# Patient Record
Sex: Male | Born: 1940
Health system: Southern US, Community
[De-identification: ages and names within clinical notes are randomized; demographics above are authoritative.]

## PROBLEM LIST (undated history)

## (undated) DIAGNOSIS — J449 Chronic obstructive pulmonary disease, unspecified: Secondary | ICD-10-CM

## (undated) DIAGNOSIS — I4510 Unspecified right bundle-branch block: Secondary | ICD-10-CM

## (undated) DIAGNOSIS — C679 Malignant neoplasm of bladder, unspecified: Secondary | ICD-10-CM

## (undated) DIAGNOSIS — K635 Polyp of colon: Secondary | ICD-10-CM

## (undated) DIAGNOSIS — I739 Peripheral vascular disease, unspecified: Secondary | ICD-10-CM

## (undated) DIAGNOSIS — E119 Type 2 diabetes mellitus without complications: Secondary | ICD-10-CM

## (undated) DIAGNOSIS — K5790 Diverticulosis of intestine, part unspecified, without perforation or abscess without bleeding: Secondary | ICD-10-CM

## (undated) DIAGNOSIS — I700 Atherosclerosis of aorta: Secondary | ICD-10-CM

## (undated) DIAGNOSIS — L989 Disorder of the skin and subcutaneous tissue, unspecified: Secondary | ICD-10-CM

## (undated) DIAGNOSIS — H9190 Unspecified hearing loss, unspecified ear: Secondary | ICD-10-CM

## (undated) DIAGNOSIS — IMO0002 Reserved for concepts with insufficient information to code with codable children: Secondary | ICD-10-CM

## (undated) DIAGNOSIS — L570 Actinic keratosis: Secondary | ICD-10-CM

## (undated) DIAGNOSIS — C439 Malignant melanoma of skin, unspecified: Secondary | ICD-10-CM

## (undated) DIAGNOSIS — I10 Essential (primary) hypertension: Secondary | ICD-10-CM

## (undated) DIAGNOSIS — C801 Malignant (primary) neoplasm, unspecified: Secondary | ICD-10-CM

## (undated) DIAGNOSIS — R0609 Other forms of dyspnea: Secondary | ICD-10-CM

## (undated) DIAGNOSIS — N400 Enlarged prostate without lower urinary tract symptoms: Secondary | ICD-10-CM

## (undated) DIAGNOSIS — R229 Localized swelling, mass and lump, unspecified: Secondary | ICD-10-CM

## (undated) DIAGNOSIS — E785 Hyperlipidemia, unspecified: Secondary | ICD-10-CM

## (undated) DIAGNOSIS — Z7901 Long term (current) use of anticoagulants: Secondary | ICD-10-CM

## (undated) HISTORY — PX: SHOULDER ARTHROSCOPY W/ ROTATOR CUFF REPAIR: SHX2400

## (undated) HISTORY — PX: HERNIA REPAIR: SHX51

## (undated) HISTORY — PX: CARDIAC CATHETERIZATION: SHX172

## (undated) HISTORY — DX: Malignant melanoma of skin, unspecified: C43.9

## (undated) HISTORY — DX: Actinic keratosis: L57.0

## (undated) HISTORY — PX: BLADDER SURGERY: SHX569

## (undated) HISTORY — PX: OTHER SURGICAL HISTORY: SHX169

---

## 1998-04-20 DIAGNOSIS — C439 Malignant melanoma of skin, unspecified: Secondary | ICD-10-CM

## 1998-04-20 HISTORY — DX: Malignant melanoma of skin, unspecified: C43.9

## 2005-08-14 ENCOUNTER — Ambulatory Visit: Payer: Self-pay | Admitting: Gastroenterology

## 2007-05-26 DIAGNOSIS — C679 Malignant neoplasm of bladder, unspecified: Secondary | ICD-10-CM

## 2007-05-26 HISTORY — DX: Malignant neoplasm of bladder, unspecified: C67.9

## 2007-06-14 ENCOUNTER — Other Ambulatory Visit: Payer: Self-pay

## 2007-06-14 ENCOUNTER — Ambulatory Visit: Payer: Self-pay | Admitting: Urology

## 2007-06-15 ENCOUNTER — Ambulatory Visit: Payer: Self-pay | Admitting: Cardiology

## 2007-06-20 ENCOUNTER — Ambulatory Visit: Payer: Self-pay | Admitting: Urology

## 2009-11-14 ENCOUNTER — Ambulatory Visit: Payer: Self-pay | Admitting: Gastroenterology

## 2009-11-18 LAB — PATHOLOGY REPORT

## 2012-02-01 ENCOUNTER — Ambulatory Visit: Payer: Self-pay | Admitting: Specialist

## 2012-02-23 ENCOUNTER — Ambulatory Visit: Payer: Self-pay | Admitting: Specialist

## 2012-02-23 LAB — POTASSIUM: Potassium: 3.9 mmol/L (ref 3.5–5.1)

## 2012-03-01 ENCOUNTER — Ambulatory Visit: Payer: Self-pay | Admitting: Specialist

## 2013-06-08 ENCOUNTER — Ambulatory Visit: Payer: Self-pay | Admitting: Cardiology

## 2014-08-07 NOTE — Op Note (Signed)
PATIENT NAME:  Jonathon Barrera, Jonathon Barrera MR#:  [id number] DATE OF BIRTH:  [birth year]  DATE OF PROCEDURE:  03/01/2012  PREOPERATIVE DIAGNOSES: 1. Large U-shape tear of the right rotator cuff with retraction.  2. Severe impingement syndrome right shoulder with anterior acromial hooking.  3. Advanced arthritis right AC joint.  4. Fraying of the biceps tendon.  5. Mild fraying of the labrum.   POSTOPERATIVE DIAGNOSES: 1. Large U-shape tear of the right rotator cuff with retraction.  2. Severe impingement syndrome right shoulder with anterior acromial hooking.  3. Advanced arthritis right AC joint.  4. Fraying of the biceps tendon.  5. Mild fraying of the labrum.   PROCEDURES:  1. Arthroscopic right rotator cuff repair with two ArthroCare 5.5-mm speed screws and free sutures.  2. Arthroscopic right distal clavicle excision.  3. Arthroscopic subacromial decompression.  4. Arthroscopic debridement of the biceps tendon and labrum.   SURGEON: Park Breed, M.D.   ANESTHESIA: General endotracheal plus interscalene block.   COMPLICATIONS: None.   DRAINS: None.   ESTIMATED BLOOD LOSS: Minimal.   REPLACED: None.   OPERATIVE FINDINGS: The patient had a large U-shape tear with retraction of the supraspinatus from the biceps tendon posteriorly into the infraspinatus. There was a large anterior hooked spur of the anterior acromion. The space beneath the [facility] joint was very tight. The labrum was frayed and the biceps tendon was frayed but intact. The glenohumeral surfaces were intact.   OPERATIVE PROCEDURE: The patient was brought to the operating room where he underwent satisfactory general endotracheal anesthesia after a right interscalene block had been instilled. He was turned to the left lateral decubitus position and padded appropriately on the beanbag. The right shoulder was prepped and draped in sterile fashion and placed in 10 pounds of traction. Arthroscopy was carried out from posterior portal  with accessory portals laterally and anteriorly. The above findings were encountered on examining the joint. Soft tissue debridement was carried out with the ArthroCare wand and motorized resector. You could see all the way down into the joint due to the retraction of the cuff. Debridement of the biceps and labrum was carried out from superiorly. The arthroscope was also introduced into the glenohumeral joint. There were no loose bodies. No other findings noted. The scope was reintroduced into the subacromial bursa and a large bur introduced. An anterior acromioplasty was carried out removing the large hook of anterior bone. Space was quite tight beneath the [facility] joint and a portion of the distal clavicle was removed in its entirety as well. The cuff tear was grasped. The  cuff was freed up on dorsal and volar surfaces.  It had fairly good mobility with a grasper. There was a U-shaped appearance to the apex. I was able to place two #2 Magnum wires side-to-side in  a convergent pattern to pull the cuff more laterally. Just doing this covered the articular surface completely.  I was then able to place two 5.5-mm speed screws into the lateral aspect of the tuberosity after the footprint had been debrided and gently burred to bleeding bone. The #2 Magnum wire suture was placed anteriorly and posteriorly in the cuff and two speed screws were introduced and the sutures were tied snugly, pulling the cuff all the way out onto the footprint, covering the joint completely.  There was a small rent anteriorly and the free ends of the sutures and the speed screw were passed through the cuff with a first pass device and these sutures were  tied on themselves to help flatten the cuff down to the footprint. Once this was completed, the joint was thoroughly irrigated free of debris. There was excellent lateralization of the cuff and good coverage. The instruments were removed and the stab wounds repaired with 3-0 nylon suture. 0.25%  Marcaine with epinephrine and morphine was placed in the joint and the bursa and a dry sterile dressing with TENS pads was applied. Traction was released and the arm was placed in a large sling. He was awakened and taken to recovery in good condition.  ____________________________ Park Breed, MD hem:bjt D:  03/01/2012 11:01:36 ET           T: 03/01/2012 11:21:55 ET          JOB#: [id number] Park Breed MD ELECTRONICALLY SIGNED 03/02/2012 13:23

## 2014-11-05 ENCOUNTER — Encounter: Payer: Self-pay | Admitting: Anesthesiology

## 2014-11-05 ENCOUNTER — Encounter
Admission: RE | Disposition: A | Discharge status: Home / Self Care | Payer: Self-pay | Source: Ambulatory Visit | Attending: Gastroenterology

## 2014-11-05 ENCOUNTER — Ambulatory Visit: Payer: PPO | Admitting: Anesthesiology

## 2014-11-05 ENCOUNTER — Ambulatory Visit
Admission: RE | Admit: 2014-11-05 | Discharge: 2014-11-05 | Disposition: A | Discharge status: Home / Self Care | Payer: PPO | Source: Ambulatory Visit | Attending: Gastroenterology | Admitting: Gastroenterology

## 2014-11-05 DIAGNOSIS — K5730 Diverticulosis of large intestine without perforation or abscess without bleeding: Secondary | ICD-10-CM | POA: Diagnosis not present

## 2014-11-05 DIAGNOSIS — Z8371 Family history of colonic polyps: Secondary | ICD-10-CM | POA: Diagnosis not present

## 2014-11-05 DIAGNOSIS — E119 Type 2 diabetes mellitus without complications: Secondary | ICD-10-CM | POA: Insufficient documentation

## 2014-11-05 DIAGNOSIS — F17210 Nicotine dependence, cigarettes, uncomplicated: Secondary | ICD-10-CM | POA: Diagnosis not present

## 2014-11-05 DIAGNOSIS — Z86010 Personal history of colonic polyps: Secondary | ICD-10-CM | POA: Diagnosis present

## 2014-11-05 DIAGNOSIS — J449 Chronic obstructive pulmonary disease, unspecified: Secondary | ICD-10-CM | POA: Insufficient documentation

## 2014-11-05 DIAGNOSIS — E785 Hyperlipidemia, unspecified: Secondary | ICD-10-CM | POA: Insufficient documentation

## 2014-11-05 DIAGNOSIS — I10 Essential (primary) hypertension: Secondary | ICD-10-CM | POA: Diagnosis not present

## 2014-11-05 DIAGNOSIS — Z800 Family history of malignant neoplasm of digestive organs: Secondary | ICD-10-CM | POA: Insufficient documentation

## 2014-11-05 DIAGNOSIS — N400 Enlarged prostate without lower urinary tract symptoms: Secondary | ICD-10-CM | POA: Diagnosis not present

## 2014-11-05 DIAGNOSIS — Z8551 Personal history of malignant neoplasm of bladder: Secondary | ICD-10-CM | POA: Insufficient documentation

## 2014-11-05 DIAGNOSIS — D127 Benign neoplasm of rectosigmoid junction: Secondary | ICD-10-CM | POA: Diagnosis not present

## 2014-11-05 HISTORY — DX: Benign prostatic hyperplasia without lower urinary tract symptoms: N40.0

## 2014-11-05 HISTORY — DX: Malignant (primary) neoplasm, unspecified: C80.1

## 2014-11-05 HISTORY — PX: COLONOSCOPY WITH PROPOFOL: SHX5780

## 2014-11-05 HISTORY — DX: Polyp of colon: K63.5

## 2014-11-05 HISTORY — DX: Malignant neoplasm of bladder, unspecified: C67.9

## 2014-11-05 HISTORY — DX: Type 2 diabetes mellitus without complications: E11.9

## 2014-11-05 HISTORY — DX: Essential (primary) hypertension: I10

## 2014-11-05 HISTORY — DX: Hyperlipidemia, unspecified: E78.5

## 2014-11-05 HISTORY — DX: Chronic obstructive pulmonary disease, unspecified: J44.9

## 2014-11-05 LAB — GLUCOSE, CAPILLARY: GLUCOSE-CAPILLARY: 121 mg/dL — AB (ref 65–99)

## 2014-11-05 SURGERY — COLONOSCOPY WITH PROPOFOL
Anesthesia: General

## 2014-11-05 MED ORDER — SODIUM CHLORIDE 0.9 % IV SOLN: INTRAVENOUS | Status: DC

## 2014-11-05 MED ORDER — PROPOFOL INFUSION 10 MG/ML OPTIME
INTRAVENOUS | Status: DC | PRN
  Administered 2014-11-05: 75 ug/kg/min via INTRAVENOUS

## 2014-11-05 MED ORDER — SODIUM CHLORIDE 0.9 % IV SOLN
INTRAVENOUS | Status: DC
  Administered 2014-11-05 (×2): via INTRAVENOUS

## 2014-11-05 MED ORDER — FENTANYL CITRATE (PF) 100 MCG/2ML IJ SOLN
INTRAMUSCULAR | Status: DC | PRN
  Administered 2014-11-05: 50 ug via INTRAVENOUS

## 2014-11-05 MED ORDER — EPHEDRINE SULFATE 50 MG/ML IJ SOLN
INTRAMUSCULAR | Status: DC | PRN
  Administered 2014-11-05: 10 mg via INTRAVENOUS
  Administered 2014-11-05: 5 mg via INTRAVENOUS

## 2014-11-05 MED ORDER — PHENYLEPHRINE HCL 10 MG/ML IJ SOLN
INTRAMUSCULAR | Status: DC | PRN
  Administered 2014-11-05: 100 ug via INTRAVENOUS

## 2014-11-05 MED ORDER — PROPOFOL 10 MG/ML IV BOLUS
INTRAVENOUS | Status: DC | PRN
  Administered 2014-11-05: 50 mg via INTRAVENOUS

## 2014-11-05 MED ORDER — GLYCOPYRROLATE 0.2 MG/ML IJ SOLN
INTRAMUSCULAR | Status: DC | PRN
  Administered 2014-11-05: 0.2 mg via INTRAVENOUS

## 2014-11-05 NOTE — Transfer of Care (Signed)
Immediate Anesthesia Transfer of Care Note  Patient: Jonathon Barrera  Procedure(s) Performed: Procedure(s): COLONOSCOPY WITH PROPOFOL (N/A)  Patient Location: PACU  Anesthesia Type:General  Level of Consciousness: awake  Airway & Oxygen Therapy: Patient Spontanous Breathing  Post-op Assessment: Report given to RN  Post vital signs: stable  Last Vitals:  Filed Vitals:   11/05/14 0936  BP: 83/59  Pulse: 76  Temp: 36.2 C  Resp: 18    Complications: No apparent anesthesia complications

## 2014-11-05 NOTE — Op Note (Signed)
[facility] Gastroenterology Patient Name: Jonathon Barrera Procedure Date: 11/05/2014 10:28 AM MRN: [id number] Account #: [id number] Date of Birth: 08-14-1940 Admit Type: Outpatient Age: 74 Room: [facility] ENDO ROOM 4 Gender: Male Note Status: Finalized Procedure:         Colonoscopy Indications:       Family history of colon cancer in a first-degree relative,                     Family history of colonic polyps in a first-degree                     relative, Personal history of colonic polyps Providers:         Lupita Dawn. Candace Cruise, MD Referring MD:      Leonie Douglas. Doy Hutching, MD (Referring MD) Medicines:         Monitored Anesthesia Care Complications:     No immediate complications. Procedure:         Pre-Anesthesia Assessment:                    - Prior to the procedure, a History and Physical was                     performed, and patient medications, allergies and                     sensitivities were reviewed. The patient's tolerance of                     previous anesthesia was reviewed.                    - The risks and benefits of the procedure and the sedation                     options and risks were discussed with the patient. All                     questions were answered and informed consent was obtained.                    - After reviewing the risks and benefits, the patient was                     deemed in satisfactory condition to undergo the procedure.                    After obtaining informed consent, the colonoscope was                     passed under direct vision. Throughout the procedure, the                     patient's blood pressure, pulse, and oxygen saturations                     were monitored continuously. The Olympus PCF-H180AL                     colonoscope ( S#: [id number] ) was introduced through the                     anus and advanced to the the cecum, identified by  appendiceal orifice and ileocecal valve. The  colonoscopy                     was performed without difficulty. The patient tolerated                     the procedure well. The quality of the bowel preparation                     was good. Findings:      A few small-mouthed diverticula were found in the sigmoid colon.      A diminutive polyp was found in the recto-sigmoid colon. The polyp was       sessile. The polyp was removed with a jumbo cold forceps. Resection and       retrieval were complete.      The exam was otherwise without abnormality.      A single medium-sized angiodysplastic lesion was found in the cecum. Impression:        - Diverticulosis in the sigmoid colon.                    - One diminutive polyp at the recto-sigmoid colon.                     Resected and retrieved.                    - The examination was otherwise normal.                    - A single colonic angiodysplastic lesion. Recommendation:    - Discharge patient to home.                    - Await pathology results.                    - Repeat colonoscopy in 5 years for surveillance.                    - The findings and recommendations were discussed with the                     patient. Procedure Code(s): --- Professional ---                    [id number], Colonoscopy, flexible; with biopsy, single or                     multiple Diagnosis Code(s): --- Professional ---                    D12.7, Benign neoplasm of rectosigmoid junction                    K55.20, Angiodysplasia of colon without hemorrhage                    Z80.0, Family history of malignant neoplasm of digestive                     organs                    Z83.71, Family history of colonic polyps                    Z86.010, Personal history of colonic polyps  K57.30, Diverticulosis of large intestine without                     perforation or abscess without bleeding CPT copyright 2014 American Medical Association. All rights reserved. The codes documented in  this report are preliminary and upon coder review may  be revised to meet current compliance requirements. Hulen Luster, MD 11/05/2014 10:58:09 AM This report has been signed electronically. Number of Addenda: 0 Note Initiated On: 11/05/2014 10:28 AM Scope Withdrawal Time: 0 hours 6 minutes 47 seconds  Total Procedure Duration: 0 hours 11 minutes 14 seconds       [facility]

## 2014-11-05 NOTE — Anesthesia Preprocedure Evaluation (Addendum)
Anesthesia Evaluation  Patient identified by MRN, date of birth, ID band Patient awake    Reviewed: Allergy & Precautions, H&P , NPO status , Patient's Chart, lab work & pertinent test results, reviewed documented beta blocker date and time   Airway Mallampati: III  TM Distance: >3 FB Neck ROM: limited    Dental  (+) Missing, Upper Dentures, Lower Dentures   Pulmonary COPDCurrent Smoker,  breath sounds clear to auscultation  Pulmonary exam normal       Cardiovascular Exercise Tolerance: Poor hypertension, Normal cardiovascular examRhythm:regular Rate:Normal     Neuro/Psych negative neurological ROS  negative psych ROS   GI/Hepatic negative GI ROS, Neg liver ROS,   Endo/Other  negative endocrine ROSdiabetes, Type 2  Renal/GU negative Renal ROS  negative genitourinary   Musculoskeletal   Abdominal   Peds  Hematology negative hematology ROS (+)   Anesthesia Other Findings Past Medical History:   COPD (chronic obstructive pulmonary disease)                 Hypertension                                                 Diabetes mellitus without complication                       Cancer                                                       Bladder carcinoma                                            Colon polyps                                                 Hyperlipemia                                                 BPH (benign prostatic hyperplasia)                           Reproductive/Obstetrics negative OB ROS                           Anesthesia Physical Anesthesia Plan  ASA: III  Anesthesia Plan: General   Post-op Pain Management:    Induction:   Airway Management Planned:   Additional Equipment:   Intra-op Plan:   Post-operative Plan:   Informed Consent: I have reviewed the patients History and Physical, chart, labs and discussed the procedure including the risks,  benefits and alternatives for the proposed anesthesia with the patient or authorized representative who has indicated his/her understanding and acceptance.   Dental Advisory Given  Plan Discussed  with: Anesthesiologist, CRNA and Surgeon  Anesthesia Plan Comments:        Anesthesia Quick Evaluation

## 2014-11-05 NOTE — Anesthesia Postprocedure Evaluation (Signed)
  Anesthesia Post-op Note  Patient: Jonathon Barrera  Procedure(s) Performed: Procedure(s): COLONOSCOPY WITH PROPOFOL (N/A)  Anesthesia type:General  Patient location: PACU  Post pain: Pain level controlled  Post assessment: Post-op Vital signs reviewed, Patient's Cardiovascular Status Stable, Respiratory Function Stable, Patent Airway and No signs of Nausea or vomiting  Post vital signs: Reviewed and stable  Last Vitals:  Filed Vitals:   11/05/14 1130  BP: 96/61  Pulse: 64  Temp:   Resp: 21    Level of consciousness: awake, alert  and patient cooperative  Complications: No apparent anesthesia complications

## 2014-11-05 NOTE — H&P (Signed)
    Primary Care Physician:  Idelle Crouch, MD Primary Gastroenterologist:  Dr. Candace Cruise  Pre-Procedure History & Physical: HPI:  Jonathon Barrera is a 74 y.o. male is here for an colonoscopy.   Past Medical History  Diagnosis Date  . Hypertension   . Diabetes mellitus without complication   . Cancer   . Bladder carcinoma   . Colon polyps   . Hyperlipemia   . BPH (benign prostatic hyperplasia)     Past Surgical History  Procedure Laterality Date  . Hernia repair    . Tur-bt      Prior to Admission medications   Medication Sig Start Date End Date Taking? Authorizing Provider  aspirin EC 81 MG tablet Take 81 mg by mouth daily.   Yes Historical Provider, MD  cyclobenzaprine (FLEXERIL) 5 MG tablet Take 5 mg by mouth 3 (three) times daily as needed for muscle spasms.   Yes Historical Provider, MD  losartan-hydrochlorothiazide (HYZAAR) 100-25 MG per tablet Take 1 tablet by mouth daily.   Yes Historical Provider, MD  metFORMIN (GLUCOPHAGE) 1000 MG tablet Take 1,000 mg by mouth 2 (two) times daily with a meal.   Yes Historical Provider, MD  Misc Natural Products (URINOZINC PLUS PO) Take by mouth.   Yes Historical Provider, MD  sitaGLIPtin (JANUVIA) 100 MG tablet Take 100 mg by mouth daily.   Yes Historical Provider, MD  tamsulosin (FLOMAX) 0.4 MG CAPS capsule Take 0.4 mg by mouth.   Yes Historical Provider, MD    Allergies as of 10/09/2014  . (Not on File)    History reviewed. No pertinent family history.  History   Social History  . Marital Status: Married    Spouse Name: N/A  . Number of Children: N/A  . Years of Education: N/A   Occupational History  . Not on file.   Social History Main Topics  . Smoking status: Current Every Day Smoker -- 0.50 packs/day    Types: Cigarettes  . Smokeless tobacco: Not on file  . Alcohol Use: 1.8 oz/week    3 Cans of beer per week  . Drug Use: No  . Sexual Activity: Not on file   Other Topics Concern  . Not on file   Social  History Narrative    Review of Systems: See HPI, otherwise negative ROS  Physical Exam: BP 83/59 mmHg  Pulse 76  Temp(Src) 97.1 F (36.2 C) (Tympanic)  Resp 18  Ht 5\' 9"  (1.753 m)  Wt 82.555 kg (182 lb)  BMI 26.86 kg/m2  SpO2 100% General:   Alert,  pleasant and cooperative in NAD Head:  Normocephalic and atraumatic. Neck:  Supple; no masses or thyromegaly. Lungs:  Clear throughout to auscultation.    Heart:  Regular rate and rhythm. Abdomen:  Soft, nontender and nondistended. Normal bowel sounds, without guarding, and without rebound.   Neurologic:  Alert and  oriented x4;  grossly normal neurologically.  Impression/Plan: Jonathon Barrera is here for an colonoscopy to be performed for personal hx of colon polyps  Risks, benefits, limitations, and alternatives regarding  Colonoscopy have been reviewed with the patient.  Questions have been answered.  All parties agreeable.   Chrystie Hagwood, Lupita Dawn, MD  11/05/2014, 10:05 AM

## 2014-11-06 LAB — SURGICAL PATHOLOGY

## 2014-11-12 ENCOUNTER — Encounter: Payer: Self-pay | Admitting: Gastroenterology

## 2015-04-24 DIAGNOSIS — H2511 Age-related nuclear cataract, right eye: Secondary | ICD-10-CM | POA: Diagnosis not present

## 2015-04-25 ENCOUNTER — Encounter: Payer: Self-pay | Admitting: *Deleted

## 2015-04-30 ENCOUNTER — Ambulatory Visit
Admission: RE | Admit: 2015-04-30 | Discharge: 2015-04-30 | Disposition: A | Discharge status: Home / Self Care | Payer: PPO | Source: Ambulatory Visit | Attending: Ophthalmology | Admitting: Ophthalmology

## 2015-04-30 ENCOUNTER — Ambulatory Visit: Payer: PPO | Admitting: Anesthesiology

## 2015-04-30 ENCOUNTER — Encounter
Admission: RE | Disposition: A | Discharge status: Home / Self Care | Payer: Self-pay | Source: Ambulatory Visit | Attending: Ophthalmology

## 2015-04-30 DIAGNOSIS — Z8551 Personal history of malignant neoplasm of bladder: Secondary | ICD-10-CM | POA: Diagnosis not present

## 2015-04-30 DIAGNOSIS — Z7984 Long term (current) use of oral hypoglycemic drugs: Secondary | ICD-10-CM | POA: Diagnosis not present

## 2015-04-30 DIAGNOSIS — E119 Type 2 diabetes mellitus without complications: Secondary | ICD-10-CM | POA: Diagnosis not present

## 2015-04-30 DIAGNOSIS — Z85820 Personal history of malignant melanoma of skin: Secondary | ICD-10-CM | POA: Insufficient documentation

## 2015-04-30 DIAGNOSIS — J449 Chronic obstructive pulmonary disease, unspecified: Secondary | ICD-10-CM | POA: Diagnosis not present

## 2015-04-30 DIAGNOSIS — H2511 Age-related nuclear cataract, right eye: Secondary | ICD-10-CM | POA: Diagnosis not present

## 2015-04-30 DIAGNOSIS — Z79899 Other long term (current) drug therapy: Secondary | ICD-10-CM | POA: Insufficient documentation

## 2015-04-30 DIAGNOSIS — H9190 Unspecified hearing loss, unspecified ear: Secondary | ICD-10-CM | POA: Insufficient documentation

## 2015-04-30 DIAGNOSIS — Z7982 Long term (current) use of aspirin: Secondary | ICD-10-CM | POA: Insufficient documentation

## 2015-04-30 DIAGNOSIS — I10 Essential (primary) hypertension: Secondary | ICD-10-CM | POA: Insufficient documentation

## 2015-04-30 DIAGNOSIS — F17200 Nicotine dependence, unspecified, uncomplicated: Secondary | ICD-10-CM | POA: Insufficient documentation

## 2015-04-30 DIAGNOSIS — Z9889 Other specified postprocedural states: Secondary | ICD-10-CM | POA: Insufficient documentation

## 2015-04-30 DIAGNOSIS — H269 Unspecified cataract: Secondary | ICD-10-CM | POA: Diagnosis not present

## 2015-04-30 HISTORY — DX: Unspecified hearing loss, unspecified ear: H91.90

## 2015-04-30 HISTORY — PX: CATARACT EXTRACTION W/PHACO: SHX586

## 2015-04-30 LAB — GLUCOSE, CAPILLARY: GLUCOSE-CAPILLARY: 164 mg/dL — AB (ref 65–99)

## 2015-04-30 SURGERY — PHACOEMULSIFICATION, CATARACT, WITH IOL INSERTION
Anesthesia: Monitor Anesthesia Care | Site: Eye | Laterality: Right | Wound class: Clean

## 2015-04-30 MED ORDER — NA CHONDROIT SULF-NA HYALURON 40-17 MG/ML IO SOLN
INTRAOCULAR | Status: DC | PRN
  Administered 2015-04-30: 1 mL via INTRAOCULAR

## 2015-04-30 MED ORDER — MOXIFLOXACIN HCL 0.5 % OP SOLN: 1.0000 [drp] | OPHTHALMIC | Status: DC | PRN

## 2015-04-30 MED ORDER — MOXIFLOXACIN HCL 0.5 % OP SOLN
OPHTHALMIC | Status: AC
  Filled 2015-04-30: qty 3

## 2015-04-30 MED ORDER — TETRACAINE HCL 0.5 % OP SOLN
OPHTHALMIC | Status: AC
  Administered 2015-04-30: 1 [drp] via OPHTHALMIC
  Filled 2015-04-30: qty 2

## 2015-04-30 MED ORDER — ARMC OPHTHALMIC DILATING GEL
1.0000 "application " | OPHTHALMIC | Status: AC | PRN
  Administered 2015-04-30 (×2): 1 via OPHTHALMIC

## 2015-04-30 MED ORDER — TETRACAINE HCL 0.5 % OP SOLN
1.0000 [drp] | OPHTHALMIC | Status: AC | PRN
  Administered 2015-04-30: 1 [drp] via OPHTHALMIC

## 2015-04-30 MED ORDER — MOXIFLOXACIN HCL 0.5 % OP SOLN
OPHTHALMIC | Status: DC | PRN
  Administered 2015-04-30: 1 [drp] via OPHTHALMIC

## 2015-04-30 MED ORDER — CARBACHOL 0.01 % IO SOLN
INTRAOCULAR | Status: DC | PRN
  Administered 2015-04-30: .5 mL via INTRAOCULAR

## 2015-04-30 MED ORDER — FENTANYL CITRATE (PF) 100 MCG/2ML IJ SOLN
INTRAMUSCULAR | Status: DC | PRN
  Administered 2015-04-30: 50 ug via INTRAVENOUS

## 2015-04-30 MED ORDER — POVIDONE-IODINE 5 % OP SOLN
OPHTHALMIC | Status: AC
  Administered 2015-04-30: 1 via OPHTHALMIC
  Filled 2015-04-30: qty 30

## 2015-04-30 MED ORDER — EPINEPHRINE HCL 1 MG/ML IJ SOLN
INTRAOCULAR | Status: DC | PRN
  Administered 2015-04-30: 1 mL via OPHTHALMIC

## 2015-04-30 MED ORDER — SODIUM CHLORIDE 0.9 % IV SOLN
INTRAVENOUS | Status: DC
  Administered 2015-04-30 (×2): via INTRAVENOUS

## 2015-04-30 MED ORDER — POVIDONE-IODINE 5 % OP SOLN
1.0000 "application " | OPHTHALMIC | Status: AC | PRN
  Administered 2015-04-30: 1 via OPHTHALMIC

## 2015-04-30 MED ORDER — CEFUROXIME OPHTHALMIC INJECTION 1 MG/0.1 ML
INJECTION | OPHTHALMIC | Status: AC
  Filled 2015-04-30: qty 0.1

## 2015-04-30 MED ORDER — EPINEPHRINE HCL 1 MG/ML IJ SOLN
INTRAMUSCULAR | Status: AC
  Filled 2015-04-30: qty 1

## 2015-04-30 MED ORDER — NA CHONDROIT SULF-NA HYALURON 40-17 MG/ML IO SOLN
INTRAOCULAR | Status: AC
  Filled 2015-04-30: qty 1

## 2015-04-30 MED ORDER — ARMC OPHTHALMIC DILATING GEL
OPHTHALMIC | Status: AC
  Administered 2015-04-30: 1 via OPHTHALMIC
  Filled 2015-04-30: qty 0.25

## 2015-04-30 MED ORDER — MIDAZOLAM HCL 2 MG/2ML IJ SOLN
INTRAMUSCULAR | Status: DC | PRN
  Administered 2015-04-30: 1 mg via INTRAVENOUS

## 2015-04-30 MED ORDER — CEFUROXIME OPHTHALMIC INJECTION 1 MG/0.1 ML
INJECTION | OPHTHALMIC | Status: DC | PRN
  Administered 2015-04-30: .1 mL via INTRACAMERAL

## 2015-04-30 SURGICAL SUPPLY — 22 items
CANNULA ANT/CHMB 27GA (MISCELLANEOUS) ×3 IMPLANT
CUP MEDICINE 2OZ PLAST GRAD ST (MISCELLANEOUS) ×3 IMPLANT
GLOVE BIO SURGEON STRL SZ8 (GLOVE) ×3 IMPLANT
GLOVE BIOGEL M 6.5 STRL (GLOVE) ×3 IMPLANT
GLOVE SURG LX 8.0 MICRO (GLOVE) ×2
GLOVE SURG LX STRL 8.0 MICRO (GLOVE) ×1 IMPLANT
GOWN STRL REUS W/ TWL LRG LVL3 (GOWN DISPOSABLE) ×2 IMPLANT
GOWN STRL REUS W/TWL LRG LVL3 (GOWN DISPOSABLE) ×4
LENS IOL TECNIS 22.5 (Intraocular Lens) ×3 IMPLANT
LENS IOL TECNIS MONO 1P 22.5 (Intraocular Lens) ×1 IMPLANT
PACK CATARACT (MISCELLANEOUS) ×3 IMPLANT
PACK CATARACT BRASINGTON LX (MISCELLANEOUS) ×3 IMPLANT
PACK EYE AFTER SURG (MISCELLANEOUS) ×3 IMPLANT
SOL BSS BAG (MISCELLANEOUS) ×3
SOL PREP PVP 2OZ (MISCELLANEOUS) ×3
SOLUTION BSS BAG (MISCELLANEOUS) ×1 IMPLANT
SOLUTION PREP PVP 2OZ (MISCELLANEOUS) ×1 IMPLANT
SYR 3ML LL SCALE MARK (SYRINGE) ×3 IMPLANT
SYR 5ML LL (SYRINGE) ×3 IMPLANT
SYR TB 1ML 27GX1/2 LL (SYRINGE) ×3 IMPLANT
WATER STERILE IRR 1000ML POUR (IV SOLUTION) ×3 IMPLANT
WIPE NON LINTING 3.25X3.25 (MISCELLANEOUS) ×3 IMPLANT

## 2015-04-30 NOTE — Transfer of Care (Signed)
Immediate Anesthesia Transfer of Care Note  Patient: Jonathon Barrera  Procedure(s) Performed: Procedure(s) with comments: CATARACT EXTRACTION PHACO AND INTRAOCULAR LENS PLACEMENT (IOC) (Right) - [place] 01:03 AP% 21.4 CDE  13.57 fluid pack lot # [id number]  Patient Location: PACU  Anesthesia Type:MAC  Level of Consciousness: awake, alert  and oriented  Airway & Oxygen Therapy: Patient Spontanous Breathing  Post-op Assessment: Report given to RN and Post -op Vital signs reviewed and stable  Post vital signs: Reviewed and stable  Last Vitals:  Filed Vitals:   04/30/15 0852  BP: 144/88  Pulse: 71  Temp: 36.8 C  Resp: 16    Complications: No apparent anesthesia complications

## 2015-04-30 NOTE — Op Note (Signed)
PREOPERATIVE DIAGNOSIS:  Nuclear sclerotic cataract of the right eye.   POSTOPERATIVE DIAGNOSIS: nuclear sclerotic cataract right eye   OPERATIVE PROCEDURE:  Procedure(s): CATARACT EXTRACTION PHACO AND INTRAOCULAR LENS PLACEMENT (IOC)   SURGEON:  Birder Robson, MD.   ANESTHESIA:  Anesthesiologist: Martha Clan, MD CRNA: Nelda Marseille, CRNA  1.      Managed anesthesia care. 2.      Topical tetracaine drops followed by 2% Xylocaine jelly applied in the preoperative holding area.   COMPLICATIONS:  None.   TECHNIQUE:   Stop and chop   DESCRIPTION OF PROCEDURE:  The patient was examined and consented in the preoperative holding area where the aforementioned topical anesthesia was applied to the right eye and then brought back to the Operating Room where the right eye was prepped and draped in the usual sterile ophthalmic fashion and a lid speculum was placed. A paracentesis was created with the side port blade and the anterior chamber was filled with viscoelastic. A near clear corneal incision was performed with the steel keratome. A continuous curvilinear capsulorrhexis was performed with a cystotome followed by the capsulorrhexis forceps. Hydrodissection and hydrodelineation were carried out with BSS on a blunt cannula. The lens was removed in a stop and chop  technique and the remaining cortical material was removed with the irrigation-aspiration handpiece. The capsular bag was inflated with viscoelastic and the Technis ZCB00  lens was placed in the capsular bag without complication. The remaining viscoelastic was removed from the eye with the irrigation-aspiration handpiece. The wounds were hydrated. The anterior chamber was flushed with Miostat and the eye was inflated to physiologic pressure. 0.1 mL of cefuroxime concentration 10 mg/mL was placed in the anterior chamber. The wounds were found to be water tight. The eye was dressed with Vigamox. The patient was given protective glasses to wear  throughout the day and a shield with which to sleep tonight. The patient was also given drops with which to begin a drop regimen today and will follow-up with me in one day.  Implant Name Type Inv. Item Serial No. Manufacturer Lot No. LRB No. Used  LENS IMPL INTRAOC ZCB00 22.5 - [id number] Intraocular Lens LENS IMPL INTRAOC ZCB00 22.5 [id number]   Right 1   Procedure(s) with comments: CATARACT EXTRACTION PHACO AND INTRAOCULAR LENS PLACEMENT (IOC) (Right) - [place] 01:03 AP% 21.4 CDE  13.57 fluid pack lot # [id number]  Electronically signed: [place] 04/30/2015 10:44 AM

## 2015-04-30 NOTE — Anesthesia Postprocedure Evaluation (Signed)
Anesthesia Post Note  Patient: Jonathon Barrera  Procedure(s) Performed: Procedure(s) (LRB): CATARACT EXTRACTION PHACO AND INTRAOCULAR LENS PLACEMENT (IOC) (Right)  Patient location during evaluation: PACU Anesthesia Type: General Level of consciousness: awake and alert Pain management: pain level controlled Vital Signs Assessment: post-procedure vital signs reviewed and stable Respiratory status: spontaneous breathing, nonlabored ventilation, respiratory function stable and patient connected to nasal cannula oxygen Cardiovascular status: blood pressure returned to baseline and stable Postop Assessment: no signs of nausea or vomiting Anesthetic complications: no    Last Vitals:  Filed Vitals:   04/30/15 1041 04/30/15 1050  BP: 138/86 154/79  Pulse: 67 68  Temp: 36.5 C   Resp: 16 16    Last Pain: There were no vitals filed for this visit.               Martha Clan

## 2015-04-30 NOTE — H&P (Signed)
All labs reviewed. Abnormal studies sent to patients PCP when indicated.  Previous H&P reviewed, patient examined, there are NO CHANGES.  Jonathon Barrera LOUIS1/10/201710:17 AM

## 2015-04-30 NOTE — Anesthesia Procedure Notes (Signed)
Procedure Name: MAC Date/Time: 04/30/2015 10:34 AM Performed by: Nelda Marseille Pre-anesthesia Checklist: Patient identified, Emergency Drugs available, Suction available, Patient being monitored and Timeout performed

## 2015-04-30 NOTE — Anesthesia Preprocedure Evaluation (Addendum)
Anesthesia Evaluation  Patient identified by MRN, date of birth, ID band Patient awake    Reviewed: Allergy & Precautions, NPO status , Patient's Chart, lab work & pertinent test results  History of Anesthesia Complications Negative for: history of anesthetic complications  Airway Mallampati: II  TM Distance: <3 FB Neck ROM: full    Dental  (+) Upper Dentures, Lower Dentures   Pulmonary neg pulmonary ROS, Current Smoker,           Cardiovascular hypertension, Pt. on medications  + Friction Rub    Neuro/Psych negative neurological ROS     GI/Hepatic negative GI ROS, Neg liver ROS,   Endo/Other  diabetes, Type 2, Oral Hypoglycemic Agents  Renal/GU negative Renal ROS     Musculoskeletal   Abdominal   Peds  Hematology negative hematology ROS (+)   Anesthesia Other Findings   Reproductive/Obstetrics                            Anesthesia Physical Anesthesia Plan  ASA: III  Anesthesia Plan: MAC   Post-op Pain Management:    Induction:   Airway Management Planned:   Additional Equipment:   Intra-op Plan:   Post-operative Plan:   Informed Consent: I have reviewed the patients History and Physical, chart, labs and discussed the procedure including the risks, benefits and alternatives for the proposed anesthesia with the patient or authorized representative who has indicated his/her understanding and acceptance.     Plan Discussed with:   Anesthesia Plan Comments:         Anesthesia Quick Evaluation

## 2015-04-30 NOTE — Discharge Instructions (Signed)
AMBULATORY SURGERY  DISCHARGE INSTRUCTIONS   1) The drugs that you were given will stay in your system until tomorrow so for the next 24 hours you should not:  A) Drive an automobile B) Make any legal decisions C) Drink any alcoholic beverage   2) You may resume regular meals tomorrow.  Today it is better to start with liquids and gradually work up to solid foods.  You may eat anything you prefer, but it is better to start with liquids, then soup and crackers, and gradually work up to solid foods.   3) Please notify your doctor immediately if you have any unusual bleeding, trouble breathing, redness and pain at the surgery site, drainage, fever, or pain not relieved by medication.    4) Additional Instructions:   Eye Surgery Discharge Instructions  Expect mild scratchy sensation or mild soreness. DO NOT RUB YOUR EYE!  The day of surgery:  Minimal physical activity, but bed rest is not required  No reading, computer work, or close hand work  No bending, lifting, or straining.  May watch TV  For 24 hours:  No driving, legal decisions, or alcoholic beverages  Safety precautions  Eat anything you prefer: It is better to start with liquids, then soup then solid foods.  _____ Eye patch should be worn until postoperative exam tomorrow.  ____ Solar shield eyeglasses should be worn for comfort in the sunlight/patch while sleeping  Resume all regular medications including aspirin or Coumadin if these were discontinued prior to surgery. You may shower, bathe, shave, or wash your hair. Tylenol may be taken for mild discomfort.  Call your doctor if you experience significant pain, nausea, or vomiting, fever > 101 or other signs of infection. [phone] or [phone] Specific instructions:  Follow-up Information    Follow up with Tim Lair, MD.   Specialty:  Ophthalmology   Why:  January 11 at 10:00   Contact information:   [place] [address] [phone]          Please contact your physician with any problems or Same Day Surgery at [phone], Monday through Friday 6 am to 4 pm, or [place] at [facility] number at [phone].

## 2015-05-15 DIAGNOSIS — H2512 Age-related nuclear cataract, left eye: Secondary | ICD-10-CM | POA: Diagnosis not present

## 2015-05-16 ENCOUNTER — Encounter: Payer: Self-pay | Admitting: *Deleted

## 2015-05-21 ENCOUNTER — Encounter
Admission: RE | Disposition: A | Discharge status: Home / Self Care | Payer: Self-pay | Source: Ambulatory Visit | Attending: Ophthalmology

## 2015-05-21 ENCOUNTER — Ambulatory Visit: Payer: PPO | Admitting: Anesthesiology

## 2015-05-21 ENCOUNTER — Ambulatory Visit
Admission: RE | Admit: 2015-05-21 | Discharge: 2015-05-21 | Disposition: A | Discharge status: Home / Self Care | Payer: PPO | Source: Ambulatory Visit | Attending: Ophthalmology | Admitting: Ophthalmology

## 2015-05-21 DIAGNOSIS — H269 Unspecified cataract: Secondary | ICD-10-CM | POA: Diagnosis not present

## 2015-05-21 DIAGNOSIS — J449 Chronic obstructive pulmonary disease, unspecified: Secondary | ICD-10-CM | POA: Diagnosis not present

## 2015-05-21 DIAGNOSIS — Z9889 Other specified postprocedural states: Secondary | ICD-10-CM | POA: Diagnosis not present

## 2015-05-21 DIAGNOSIS — H2512 Age-related nuclear cataract, left eye: Secondary | ICD-10-CM | POA: Diagnosis not present

## 2015-05-21 DIAGNOSIS — Z7982 Long term (current) use of aspirin: Secondary | ICD-10-CM | POA: Diagnosis not present

## 2015-05-21 DIAGNOSIS — E119 Type 2 diabetes mellitus without complications: Secondary | ICD-10-CM | POA: Diagnosis not present

## 2015-05-21 DIAGNOSIS — Z8551 Personal history of malignant neoplasm of bladder: Secondary | ICD-10-CM | POA: Diagnosis not present

## 2015-05-21 DIAGNOSIS — F17200 Nicotine dependence, unspecified, uncomplicated: Secondary | ICD-10-CM | POA: Insufficient documentation

## 2015-05-21 DIAGNOSIS — I10 Essential (primary) hypertension: Secondary | ICD-10-CM | POA: Insufficient documentation

## 2015-05-21 DIAGNOSIS — Z79899 Other long term (current) drug therapy: Secondary | ICD-10-CM | POA: Diagnosis not present

## 2015-05-21 DIAGNOSIS — Z8546 Personal history of malignant neoplasm of prostate: Secondary | ICD-10-CM | POA: Insufficient documentation

## 2015-05-21 DIAGNOSIS — E785 Hyperlipidemia, unspecified: Secondary | ICD-10-CM | POA: Diagnosis not present

## 2015-05-21 DIAGNOSIS — Z85820 Personal history of malignant melanoma of skin: Secondary | ICD-10-CM | POA: Insufficient documentation

## 2015-05-21 DIAGNOSIS — H9190 Unspecified hearing loss, unspecified ear: Secondary | ICD-10-CM | POA: Insufficient documentation

## 2015-05-21 DIAGNOSIS — Z9849 Cataract extraction status, unspecified eye: Secondary | ICD-10-CM | POA: Diagnosis not present

## 2015-05-21 HISTORY — PX: CATARACT EXTRACTION W/PHACO: SHX586

## 2015-05-21 LAB — GLUCOSE, CAPILLARY: Glucose-Capillary: 122 mg/dL — ABNORMAL HIGH (ref 65–99)

## 2015-05-21 SURGERY — PHACOEMULSIFICATION, CATARACT, WITH IOL INSERTION
Anesthesia: Monitor Anesthesia Care | Site: Eye | Laterality: Left | Wound class: Clean

## 2015-05-21 MED ORDER — MOXIFLOXACIN HCL 0.5 % OP SOLN
OPHTHALMIC | Status: AC
  Filled 2015-05-21: qty 3

## 2015-05-21 MED ORDER — EPINEPHRINE HCL 1 MG/ML IJ SOLN
INTRAOCULAR | Status: DC | PRN
  Administered 2015-05-21: 1 mL via OPHTHALMIC

## 2015-05-21 MED ORDER — ALFENTANIL 500 MCG/ML IJ INJ
INJECTION | INTRAMUSCULAR | Status: DC | PRN
  Administered 2015-05-21: 500 ug via INTRAVENOUS

## 2015-05-21 MED ORDER — ARMC OPHTHALMIC DILATING GEL
1.0000 "application " | OPHTHALMIC | Status: AC | PRN
  Administered 2015-05-21 (×2): 1 via OPHTHALMIC

## 2015-05-21 MED ORDER — CEFUROXIME OPHTHALMIC INJECTION 1 MG/0.1 ML
INJECTION | OPHTHALMIC | Status: AC
  Filled 2015-05-21: qty 0.1

## 2015-05-21 MED ORDER — TETRACAINE HCL 0.5 % OP SOLN
1.0000 [drp] | OPHTHALMIC | Status: AC | PRN
  Administered 2015-05-21: 1 [drp] via OPHTHALMIC

## 2015-05-21 MED ORDER — POVIDONE-IODINE 5 % OP SOLN
1.0000 "application " | OPHTHALMIC | Status: AC | PRN
  Administered 2015-05-21: 1 via OPHTHALMIC

## 2015-05-21 MED ORDER — ARMC OPHTHALMIC DILATING GEL
OPHTHALMIC | Status: AC
  Administered 2015-05-21: 1 via OPHTHALMIC
  Filled 2015-05-21: qty 0.25

## 2015-05-21 MED ORDER — MOXIFLOXACIN HCL 0.5 % OP SOLN
OPHTHALMIC | Status: DC | PRN
  Administered 2015-05-21: 1 [drp] via OPHTHALMIC

## 2015-05-21 MED ORDER — POVIDONE-IODINE 5 % OP SOLN
OPHTHALMIC | Status: AC
  Administered 2015-05-21: 1 via OPHTHALMIC
  Filled 2015-05-21: qty 30

## 2015-05-21 MED ORDER — EPINEPHRINE HCL 1 MG/ML IJ SOLN
INTRAMUSCULAR | Status: AC
  Filled 2015-05-21: qty 1

## 2015-05-21 MED ORDER — MIDAZOLAM HCL 2 MG/2ML IJ SOLN
INTRAMUSCULAR | Status: DC | PRN
  Administered 2015-05-21: 1 mg via INTRAVENOUS

## 2015-05-21 MED ORDER — CEFUROXIME OPHTHALMIC INJECTION 1 MG/0.1 ML
INJECTION | OPHTHALMIC | Status: DC | PRN
  Administered 2015-05-21: .1 mL via INTRACAMERAL

## 2015-05-21 MED ORDER — MOXIFLOXACIN HCL 0.5 % OP SOLN
1.0000 [drp] | OPHTHALMIC | Status: DC | PRN
Start: 2015-05-21 — End: 2015-05-21

## 2015-05-21 MED ORDER — TETRACAINE HCL 0.5 % OP SOLN
OPHTHALMIC | Status: AC
  Administered 2015-05-21: 1 [drp] via OPHTHALMIC
  Filled 2015-05-21: qty 2

## 2015-05-21 MED ORDER — CARBACHOL 0.01 % IO SOLN
INTRAOCULAR | Status: DC | PRN
  Administered 2015-05-21: .5 mL via INTRAOCULAR

## 2015-05-21 MED ORDER — NA CHONDROIT SULF-NA HYALURON 40-17 MG/ML IO SOLN
INTRAOCULAR | Status: DC | PRN
  Administered 2015-05-21: 1 mL via INTRAOCULAR

## 2015-05-21 MED ORDER — SODIUM CHLORIDE 0.9 % IV SOLN
INTRAVENOUS | Status: DC
  Administered 2015-05-21: 11:00:00 via INTRAVENOUS

## 2015-05-21 SURGICAL SUPPLY — 22 items
CANNULA ANT/CHMB 27GA (MISCELLANEOUS) ×3 IMPLANT
CUP MEDICINE 2OZ PLAST GRAD ST (MISCELLANEOUS) ×3 IMPLANT
GLOVE BIO SURGEON STRL SZ8 (GLOVE) ×3 IMPLANT
GLOVE BIOGEL M 6.5 STRL (GLOVE) ×3 IMPLANT
GLOVE SURG LX 8.0 MICRO (GLOVE) ×2
GLOVE SURG LX STRL 8.0 MICRO (GLOVE) ×1 IMPLANT
GOWN STRL REUS W/ TWL LRG LVL3 (GOWN DISPOSABLE) ×2 IMPLANT
GOWN STRL REUS W/TWL LRG LVL3 (GOWN DISPOSABLE) ×4
LENS IOL TECNIS 23.0 (Intraocular Lens) ×3 IMPLANT
LENS IOL TECNIS MONO 1P 23.0 (Intraocular Lens) ×1 IMPLANT
PACK CATARACT (MISCELLANEOUS) ×3 IMPLANT
PACK CATARACT BRASINGTON LX (MISCELLANEOUS) ×3 IMPLANT
PACK EYE AFTER SURG (MISCELLANEOUS) ×3 IMPLANT
SOL BSS BAG (MISCELLANEOUS) ×3
SOL PREP PVP 2OZ (MISCELLANEOUS) ×3
SOLUTION BSS BAG (MISCELLANEOUS) ×1 IMPLANT
SOLUTION PREP PVP 2OZ (MISCELLANEOUS) ×1 IMPLANT
SYR 3ML LL SCALE MARK (SYRINGE) ×3 IMPLANT
SYR 5ML LL (SYRINGE) ×3 IMPLANT
SYR TB 1ML 27GX1/2 LL (SYRINGE) ×3 IMPLANT
WATER STERILE IRR 1000ML POUR (IV SOLUTION) ×3 IMPLANT
WIPE NON LINTING 3.25X3.25 (MISCELLANEOUS) ×3 IMPLANT

## 2015-05-21 NOTE — Op Note (Signed)
PREOPERATIVE DIAGNOSIS:  Nuclear sclerotic cataract of the left eye.   POSTOPERATIVE DIAGNOSIS:  nuclear sclerotic cataract left eye   OPERATIVE PROCEDURE:  Procedure(s): CATARACT EXTRACTION PHACO AND INTRAOCULAR LENS PLACEMENT (IOC)   SURGEON:  Birder Robson, MD.   ANESTHESIA:   Anesthesiologist: Iver Nestle, MD; Alvin Critchley, MD CRNA: Jonna Clark, CRNA  1.      Managed anesthesia care. 2.      Topical tetracaine drops followed by 2% Xylocaine jelly applied in the preoperative holding area.   COMPLICATIONS:  None.   TECHNIQUE:   Stop and chop   DESCRIPTION OF PROCEDURE:  The patient was examined and consented in the preoperative holding area where the aforementioned topical anesthesia was applied to the left eye and then brought back to the Operating Room where the left eye was prepped and draped in the usual sterile ophthalmic fashion and a lid speculum was placed. A paracentesis was created with the side port blade and the anterior chamber was filled with viscoelastic. A near clear corneal incision was performed with the steel keratome. A continuous curvilinear capsulorrhexis was performed with a cystotome followed by the capsulorrhexis forceps. Hydrodissection and hydrodelineation were carried out with BSS on a blunt cannula. The lens was removed in a stop and chop  technique and the remaining cortical material was removed with the irrigation-aspiration handpiece. The capsular bag was inflated with viscoelastic and the Technis ZCB00 lens was placed in the capsular bag without complication. The remaining viscoelastic was removed from the eye with the irrigation-aspiration handpiece. The wounds were hydrated. The anterior chamber was flushed with Miostat and the eye was inflated to physiologic pressure. 0.1 mL of cefuroxime concentration 10 mg/mL was placed in the anterior chamber. The wounds were found to be water tight. The eye was dressed with Vigamox. The patient was  given protective glasses to wear throughout the day and a shield with which to sleep tonight. The patient was also given drops with which to begin a drop regimen today and will follow-up with me in one day.  Implant Name Type Inv. Item Serial No. Manufacturer Lot No. LRB No. Used  LENS IMPL INTRAOC ZCB00 23.0 - [id number] Intraocular Lens LENS IMPL INTRAOC ZCB00 23.0 [id number]   Left 1   Procedure(s) with comments: CATARACT EXTRACTION PHACO AND INTRAOCULAR LENS PLACEMENT (IOC) (Left) - [place] 00:52 AP% 18.9 CDE 9.95 fluid pack lot [id number]  Electronically signed: Welcome 05/21/2015 12:17 PM

## 2015-05-21 NOTE — H&P (Signed)
All labs reviewed. Abnormal studies sent to patients PCP when indicated.  Previous H&P reviewed, patient examined, there are NO CHANGES.  Jonathon Barrera LOUIS1/31/201711:47 AM

## 2015-05-21 NOTE — Discharge Instructions (Signed)
Eye Surgery Discharge Instructions  Expect mild scratchy sensation or mild soreness. DO NOT RUB YOUR EYE!  The day of surgery:  Minimal physical activity, but bed rest is not required  No reading, computer work, or close hand work  No bending, lifting, or straining.  May watch TV  For 24 hours:  No driving, legal decisions, or alcoholic beverages  Safety precautions  Eat anything you prefer: It is better to start with liquids, then soup then solid foods.  _____ Eye patch should be worn until postoperative exam tomorrow.  ____ Solar shield eyeglasses should be worn for comfort in the sunlight/patch while sleeping  Resume all regular medications including aspirin or Coumadin if these were discontinued prior to surgery. You may shower, bathe, shave, or wash your hair. Tylenol may be taken for mild discomfort.  Call your doctor if you experience significant pain, nausea, or vomiting, fever > 101 or other signs of infection. [phone] or [phone] Specific instructions:  Follow-up Information    Follow up with Tim Lair, MD On 05/22/2015.   Specialty:  Ophthalmology   Why:  For wound re-check at 9:35 am   Contact information:   [place]  [id number] [phone]     AMBULATORY SURGERY  DISCHARGE INSTRUCTIONS   1) The drugs that you were given will stay in your system until tomorrow so for the next 24 hours you should not:  A) Drive an automobile B) Make any legal decisions C) Drink any alcoholic beverage   2) You may resume regular meals tomorrow.  Today it is better to start with liquids and gradually work up to solid foods.  You may eat anything you prefer, but it is better to start with liquids, then soup and crackers, and gradually work up to solid foods.   3) Please notify your doctor immediately if you have any unusual bleeding, trouble breathing, redness and pain at the surgery site, drainage, fever, or pain not relieved  by medication.    Additional Instructions:   Please contact your physician with any problems or Same Day Surgery at [phone], Monday through Friday 6 am to 4 pm, or [place] at [facility] number at [phone].

## 2015-05-21 NOTE — Transfer of Care (Signed)
Immediate Anesthesia Transfer of Care Note  Patient: Jonathon Barrera  Procedure(s) Performed: Procedure(s) with comments: CATARACT EXTRACTION PHACO AND INTRAOCULAR LENS PLACEMENT (IOC) (Left) - [place] 00:52 AP% 18.9 CDE 9.95 fluid pack lot [id number]  Patient Location: PACU and Short Stay  Anesthesia Type:MAC  Level of Consciousness: awake, alert  and oriented  Airway & Oxygen Therapy: Patient Spontanous Breathing  Post-op Assessment: Report given to RN and Post -op Vital signs reviewed and stable  Post vital signs: Reviewed and stable  Last Vitals:  Filed Vitals:   05/21/15 1035 05/21/15 1220  BP: 135/81 134/94  Pulse: 64 54  Temp: 36.6 C 36.5 C  Resp: 16 16    Complications: No apparent anesthesia complications

## 2015-05-21 NOTE — Anesthesia Postprocedure Evaluation (Signed)
Anesthesia Post Note  Patient: Jonathon Barrera  Procedure(s) Performed: Procedure(s) (LRB): CATARACT EXTRACTION PHACO AND INTRAOCULAR LENS PLACEMENT (IOC) (Left)  Patient location during evaluation: Short Stay Anesthesia Type: MAC Level of consciousness: awake and alert and oriented Pain management: pain level controlled Vital Signs Assessment: post-procedure vital signs reviewed and stable Respiratory status: spontaneous breathing Cardiovascular status: stable Anesthetic complications: no    Last Vitals:  Filed Vitals:   05/21/15 1035 05/21/15 1220  BP: 135/81 134/94  Pulse: 64 54  Temp: 36.6 C 36.5 C  Resp: 16 16    Last Pain:  Filed Vitals:   05/21/15 1220  PainSc: 0-No pain                 Lanora Manis

## 2015-05-21 NOTE — Anesthesia Preprocedure Evaluation (Signed)
Anesthesia Evaluation  Patient identified by MRN, date of birth, ID band Patient awake    Reviewed: Allergy & Precautions, NPO status , Patient's Chart, lab work & pertinent test results  Airway Mallampati: III   Neck ROM: Limited  Mouth opening: Limited Mouth Opening  Dental  (+) Lower Dentures, Upper Dentures   Pulmonary COPD,  COPD inhaler, Current Smoker,    Pulmonary exam normal        Cardiovascular hypertension, Pt. on medications Normal cardiovascular exam     Neuro/Psych negative neurological ROS  negative psych ROS   GI/Hepatic negative GI ROS, Neg liver ROS,   Endo/Other  diabetes, Well Controlled, Type 2  Renal/GU negative Renal ROS Bladder dysfunction  Bladder CA Hx    Musculoskeletal negative musculoskeletal ROS (+)   Abdominal Normal abdominal exam  (+)   Peds negative pediatric ROS (+)  Hematology negative hematology ROS (+)   Anesthesia Other Findings   Reproductive/Obstetrics                             Anesthesia Physical Anesthesia Plan  ASA: III  Anesthesia Plan: MAC   Post-op Pain Management:    Induction: Intravenous  Airway Management Planned: Nasal Cannula  Additional Equipment:   Intra-op Plan:   Post-operative Plan:   Informed Consent: I have reviewed the patients History and Physical, chart, labs and discussed the procedure including the risks, benefits and alternatives for the proposed anesthesia with the patient or authorized representative who has indicated his/her understanding and acceptance.   Dental advisory given  Plan Discussed with: CRNA and Surgeon  Anesthesia Plan Comments:         Anesthesia Quick Evaluation

## 2015-06-19 DIAGNOSIS — Z961 Presence of intraocular lens: Secondary | ICD-10-CM | POA: Diagnosis not present

## 2015-06-20 DIAGNOSIS — Z79899 Other long term (current) drug therapy: Secondary | ICD-10-CM | POA: Diagnosis not present

## 2015-06-20 DIAGNOSIS — E7800 Pure hypercholesterolemia, unspecified: Secondary | ICD-10-CM | POA: Diagnosis not present

## 2015-06-20 DIAGNOSIS — Z125 Encounter for screening for malignant neoplasm of prostate: Secondary | ICD-10-CM | POA: Diagnosis not present

## 2015-06-20 DIAGNOSIS — I10 Essential (primary) hypertension: Secondary | ICD-10-CM | POA: Diagnosis not present

## 2015-06-20 DIAGNOSIS — E119 Type 2 diabetes mellitus without complications: Secondary | ICD-10-CM | POA: Diagnosis not present

## 2015-06-20 DIAGNOSIS — Z0000 Encounter for general adult medical examination without abnormal findings: Secondary | ICD-10-CM | POA: Diagnosis not present

## 2015-06-20 DIAGNOSIS — J449 Chronic obstructive pulmonary disease, unspecified: Secondary | ICD-10-CM | POA: Diagnosis not present

## 2015-06-20 DIAGNOSIS — J431 Panlobular emphysema: Secondary | ICD-10-CM | POA: Diagnosis not present

## 2015-12-16 DIAGNOSIS — H43813 Vitreous degeneration, bilateral: Secondary | ICD-10-CM | POA: Diagnosis not present

## 2015-12-26 DIAGNOSIS — E7800 Pure hypercholesterolemia, unspecified: Secondary | ICD-10-CM | POA: Diagnosis not present

## 2015-12-26 DIAGNOSIS — E119 Type 2 diabetes mellitus without complications: Secondary | ICD-10-CM | POA: Diagnosis not present

## 2015-12-26 DIAGNOSIS — Z79899 Other long term (current) drug therapy: Secondary | ICD-10-CM | POA: Diagnosis not present

## 2015-12-26 DIAGNOSIS — I10 Essential (primary) hypertension: Secondary | ICD-10-CM | POA: Diagnosis not present

## 2016-01-20 DIAGNOSIS — Z23 Encounter for immunization: Secondary | ICD-10-CM | POA: Diagnosis not present

## 2016-06-25 DIAGNOSIS — Z79899 Other long term (current) drug therapy: Secondary | ICD-10-CM | POA: Diagnosis not present

## 2016-06-25 DIAGNOSIS — J449 Chronic obstructive pulmonary disease, unspecified: Secondary | ICD-10-CM | POA: Diagnosis not present

## 2016-06-25 DIAGNOSIS — I10 Essential (primary) hypertension: Secondary | ICD-10-CM | POA: Diagnosis not present

## 2016-06-25 DIAGNOSIS — E7800 Pure hypercholesterolemia, unspecified: Secondary | ICD-10-CM | POA: Diagnosis not present

## 2016-06-25 DIAGNOSIS — Z125 Encounter for screening for malignant neoplasm of prostate: Secondary | ICD-10-CM | POA: Diagnosis not present

## 2016-06-25 DIAGNOSIS — Z0000 Encounter for general adult medical examination without abnormal findings: Secondary | ICD-10-CM | POA: Diagnosis not present

## 2016-06-25 DIAGNOSIS — E119 Type 2 diabetes mellitus without complications: Secondary | ICD-10-CM | POA: Diagnosis not present

## 2016-06-25 DIAGNOSIS — J431 Panlobular emphysema: Secondary | ICD-10-CM | POA: Diagnosis not present

## 2016-09-16 DIAGNOSIS — L812 Freckles: Secondary | ICD-10-CM | POA: Diagnosis not present

## 2016-09-16 DIAGNOSIS — D485 Neoplasm of uncertain behavior of skin: Secondary | ICD-10-CM | POA: Diagnosis not present

## 2016-09-16 DIAGNOSIS — L821 Other seborrheic keratosis: Secondary | ICD-10-CM | POA: Diagnosis not present

## 2016-09-16 DIAGNOSIS — L570 Actinic keratosis: Secondary | ICD-10-CM | POA: Diagnosis not present

## 2016-09-16 DIAGNOSIS — D1800 Hemangioma unspecified site: Secondary | ICD-10-CM | POA: Diagnosis not present

## 2016-09-16 DIAGNOSIS — D235 Other benign neoplasm of skin of trunk: Secondary | ICD-10-CM | POA: Diagnosis not present

## 2016-09-16 DIAGNOSIS — Z1283 Encounter for screening for malignant neoplasm of skin: Secondary | ICD-10-CM | POA: Diagnosis not present

## 2016-09-16 DIAGNOSIS — L578 Other skin changes due to chronic exposure to nonionizing radiation: Secondary | ICD-10-CM | POA: Diagnosis not present

## 2016-09-16 DIAGNOSIS — D229 Melanocytic nevi, unspecified: Secondary | ICD-10-CM | POA: Diagnosis not present

## 2016-09-16 DIAGNOSIS — I788 Other diseases of capillaries: Secondary | ICD-10-CM | POA: Diagnosis not present

## 2016-09-25 DIAGNOSIS — E119 Type 2 diabetes mellitus without complications: Secondary | ICD-10-CM | POA: Diagnosis not present

## 2016-11-16 DIAGNOSIS — L570 Actinic keratosis: Secondary | ICD-10-CM | POA: Diagnosis not present

## 2016-11-16 DIAGNOSIS — L821 Other seborrheic keratosis: Secondary | ICD-10-CM | POA: Diagnosis not present

## 2016-11-16 DIAGNOSIS — L820 Inflamed seborrheic keratosis: Secondary | ICD-10-CM | POA: Diagnosis not present

## 2016-11-16 DIAGNOSIS — Z85820 Personal history of malignant melanoma of skin: Secondary | ICD-10-CM | POA: Diagnosis not present

## 2016-11-16 DIAGNOSIS — L812 Freckles: Secondary | ICD-10-CM | POA: Diagnosis not present

## 2016-11-16 DIAGNOSIS — L578 Other skin changes due to chronic exposure to nonionizing radiation: Secondary | ICD-10-CM | POA: Diagnosis not present

## 2016-12-28 DIAGNOSIS — I10 Essential (primary) hypertension: Secondary | ICD-10-CM | POA: Diagnosis not present

## 2016-12-28 DIAGNOSIS — E7800 Pure hypercholesterolemia, unspecified: Secondary | ICD-10-CM | POA: Diagnosis not present

## 2016-12-28 DIAGNOSIS — J431 Panlobular emphysema: Secondary | ICD-10-CM | POA: Diagnosis not present

## 2016-12-28 DIAGNOSIS — Z79899 Other long term (current) drug therapy: Secondary | ICD-10-CM | POA: Diagnosis not present

## 2016-12-28 DIAGNOSIS — E119 Type 2 diabetes mellitus without complications: Secondary | ICD-10-CM | POA: Diagnosis not present

## 2017-02-01 DIAGNOSIS — L720 Epidermal cyst: Secondary | ICD-10-CM | POA: Diagnosis not present

## 2017-02-01 DIAGNOSIS — L812 Freckles: Secondary | ICD-10-CM | POA: Diagnosis not present

## 2017-02-01 DIAGNOSIS — L578 Other skin changes due to chronic exposure to nonionizing radiation: Secondary | ICD-10-CM | POA: Diagnosis not present

## 2017-02-01 DIAGNOSIS — C4492 Squamous cell carcinoma of skin, unspecified: Secondary | ICD-10-CM

## 2017-02-01 DIAGNOSIS — L821 Other seborrheic keratosis: Secondary | ICD-10-CM | POA: Diagnosis not present

## 2017-02-01 DIAGNOSIS — D485 Neoplasm of uncertain behavior of skin: Secondary | ICD-10-CM | POA: Diagnosis not present

## 2017-02-01 DIAGNOSIS — C44629 Squamous cell carcinoma of skin of left upper limb, including shoulder: Secondary | ICD-10-CM | POA: Diagnosis not present

## 2017-02-01 HISTORY — DX: Squamous cell carcinoma of skin, unspecified: C44.92

## 2017-02-08 DIAGNOSIS — Z23 Encounter for immunization: Secondary | ICD-10-CM | POA: Diagnosis not present

## 2017-03-03 DIAGNOSIS — D485 Neoplasm of uncertain behavior of skin: Secondary | ICD-10-CM | POA: Diagnosis not present

## 2017-03-03 DIAGNOSIS — L820 Inflamed seborrheic keratosis: Secondary | ICD-10-CM | POA: Diagnosis not present

## 2017-03-03 DIAGNOSIS — L570 Actinic keratosis: Secondary | ICD-10-CM | POA: Diagnosis not present

## 2017-03-03 DIAGNOSIS — Z85828 Personal history of other malignant neoplasm of skin: Secondary | ICD-10-CM | POA: Diagnosis not present

## 2017-03-03 DIAGNOSIS — L821 Other seborrheic keratosis: Secondary | ICD-10-CM | POA: Diagnosis not present

## 2017-03-03 DIAGNOSIS — L578 Other skin changes due to chronic exposure to nonionizing radiation: Secondary | ICD-10-CM | POA: Diagnosis not present

## 2017-04-16 DIAGNOSIS — H3554 Dystrophies primarily involving the retinal pigment epithelium: Secondary | ICD-10-CM | POA: Diagnosis not present

## 2017-07-01 DIAGNOSIS — Z79899 Other long term (current) drug therapy: Secondary | ICD-10-CM | POA: Diagnosis not present

## 2017-07-01 DIAGNOSIS — E7800 Pure hypercholesterolemia, unspecified: Secondary | ICD-10-CM | POA: Diagnosis not present

## 2017-07-01 DIAGNOSIS — I10 Essential (primary) hypertension: Secondary | ICD-10-CM | POA: Diagnosis not present

## 2017-07-01 DIAGNOSIS — J431 Panlobular emphysema: Secondary | ICD-10-CM | POA: Diagnosis not present

## 2017-07-01 DIAGNOSIS — Z0000 Encounter for general adult medical examination without abnormal findings: Secondary | ICD-10-CM | POA: Diagnosis not present

## 2017-07-01 DIAGNOSIS — J449 Chronic obstructive pulmonary disease, unspecified: Secondary | ICD-10-CM | POA: Diagnosis not present

## 2017-07-01 DIAGNOSIS — Z125 Encounter for screening for malignant neoplasm of prostate: Secondary | ICD-10-CM | POA: Diagnosis not present

## 2017-07-01 DIAGNOSIS — E119 Type 2 diabetes mellitus without complications: Secondary | ICD-10-CM | POA: Diagnosis not present

## 2017-10-29 DIAGNOSIS — T1512XA Foreign body in conjunctival sac, left eye, initial encounter: Secondary | ICD-10-CM | POA: Diagnosis not present

## 2017-11-15 DIAGNOSIS — Z85828 Personal history of other malignant neoplasm of skin: Secondary | ICD-10-CM | POA: Diagnosis not present

## 2017-11-15 DIAGNOSIS — Z85820 Personal history of malignant melanoma of skin: Secondary | ICD-10-CM | POA: Diagnosis not present

## 2017-11-15 DIAGNOSIS — L821 Other seborrheic keratosis: Secondary | ICD-10-CM | POA: Diagnosis not present

## 2017-11-15 DIAGNOSIS — L820 Inflamed seborrheic keratosis: Secondary | ICD-10-CM | POA: Diagnosis not present

## 2017-11-15 DIAGNOSIS — L578 Other skin changes due to chronic exposure to nonionizing radiation: Secondary | ICD-10-CM | POA: Diagnosis not present

## 2017-11-15 DIAGNOSIS — D225 Melanocytic nevi of trunk: Secondary | ICD-10-CM | POA: Diagnosis not present

## 2017-11-15 DIAGNOSIS — Z1283 Encounter for screening for malignant neoplasm of skin: Secondary | ICD-10-CM | POA: Diagnosis not present

## 2017-11-15 DIAGNOSIS — L570 Actinic keratosis: Secondary | ICD-10-CM | POA: Diagnosis not present

## 2018-01-04 DIAGNOSIS — I10 Essential (primary) hypertension: Secondary | ICD-10-CM | POA: Diagnosis not present

## 2018-01-04 DIAGNOSIS — E119 Type 2 diabetes mellitus without complications: Secondary | ICD-10-CM | POA: Diagnosis not present

## 2018-01-04 DIAGNOSIS — E7800 Pure hypercholesterolemia, unspecified: Secondary | ICD-10-CM | POA: Diagnosis not present

## 2018-01-04 DIAGNOSIS — Z79899 Other long term (current) drug therapy: Secondary | ICD-10-CM | POA: Diagnosis not present

## 2018-01-04 DIAGNOSIS — J431 Panlobular emphysema: Secondary | ICD-10-CM | POA: Diagnosis not present

## 2018-01-27 DIAGNOSIS — Z23 Encounter for immunization: Secondary | ICD-10-CM | POA: Diagnosis not present

## 2018-02-17 ENCOUNTER — Ambulatory Visit: Payer: Self-pay | Admitting: General Surgery

## 2018-02-17 DIAGNOSIS — L720 Epidermal cyst: Secondary | ICD-10-CM | POA: Diagnosis not present

## 2018-02-17 DIAGNOSIS — R220 Localized swelling, mass and lump, head: Secondary | ICD-10-CM | POA: Diagnosis not present

## 2018-02-17 NOTE — H&P (View-Only) (Signed)
PATIENT PROFILE: Jonathon Barrera is a 77 y.o. male who presents to the Clinic for consultation at the request of Dr. Doy Hutching for evaluation of epidermal inclusion cyst.  PCP:  Idelle Crouch, MD  HISTORY OF PRESENT ILLNESS: Mr. Biebel reports having a cyst on his back scalp and face since many years ago. In the last few weeks he has been feeling that both cyst has been growing in size. Refers that in these last few weeks it start to cause pain and discomfort. Pain does not radiates. Pain is aggravated with pressure. No alleviator factor. Patient denies previous infection or secretions.    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 Followed by Dr. Yves Dill      BPH (benign prostatic hyperplasia) Unknown   Hx of colonic polyps Unknown   Overview    Histological report colonoscopy 07/20/05 adenomatous      Hyperlipidemia Unknown   Type 2 diabetes mellitus (CMS-HCC) Unknown      GENERAL REVIEW OF SYSTEMS:   General ROS: negative for - chills, fatigue, fever, weight gain or weight loss Allergy and Immunology ROS: negative for - hives  Hematological and Lymphatic ROS: negative for - bleeding problems or bruising, negative for palpable nodes Endocrine ROS: negative for - heat or cold intolerance, hair changes Respiratory ROS: negative for - cough, shortness of breath or wheezing Cardiovascular ROS: no chest pain or palpitations GI ROS: negative for nausea, vomiting, abdominal pain, diarrhea, constipation Musculoskeletal ROS: negative for - joint swelling or muscle pain Neurological ROS: negative for - confusion, syncope Dermatological ROS: negative for pruritus and rash Psychiatric: negative for anxiety, depression,  difficulty sleeping and memory loss  MEDICATIONS: CurrentMedications        Current Outpatient Medications  Medication Sig Dispense Refill  . aspirin 81 MG EC tablet Take 81 mg by mouth once daily.    Marland Kitchen glimepiride (AMARYL) 2 MG tablet TAKE 1 TABLET (2 MG TOTAL) BY MOUTH DAILY WITH BREAKFAST. 90 tablet 3  . losartan-hydrochlorothiazide (HYZAAR) 100-25 mg tablet Take 1 tablet by mouth once daily 90 tablet 3  . metFORMIN (GLUCOPHAGE) 1000 MG tablet Take 1 tablet (1,000 mg total) by mouth 2 (two) times daily with meals 180 tablet 1  . MULTIVIT-MIN/HRB CB121 (URINOZINC PROSTATE FORMULA ORAL) Take by mouth once daily.      . tamsulosin (FLOMAX) 0.4 mg capsule Take 0.4 mg by mouth as needed. Take 30 minutes after same meal each day.     No current facility-administered medications for this visit.       ALLERGIES: Patient has no known allergies.  PAST MEDICAL HISTORY:     Past Medical History:  Diagnosis Date  . Bladder carcinoma (CMS-HCC)    papillary-transitional cell 05/26/2007 Yves Dill). Followed by Dr. Yves Dill  . BPH (benign prostatic hyperplasia)   . COPD (chronic obstructive pulmonary disease) (CMS-HCC)   . Diverticulosis 11/05/2014, 11/14/2009  . Hx of colonic polyps    Histological report colonoscopy 07/20/05 adenomatous  . Hyperlipidemia   . Hyperplastic colon polyp 11/05/2014, 01/29/1999  . Hypertension   . Tobacco abuse   . Tubular adenoma of colon, unspecified 08/14/2005  . Type 2 diabetes mellitus (CMS-HCC)     PAST  SURGICAL HISTORY:      Past Surgical History:  Procedure Laterality Date  . COLONOSCOPY  11/14/2009   PHx polyps/FHx CC-Mother/FHx CP-Brother  . COLONOSCOPY  11/05/2014   Hyperplastic colon polyp/PHx polyps/FHx CC-Mother/FHx CP-Brother/Repeat 11yr/PYO  . Right inguinal hernia repair    . TUR-BT on 06/20/2007 (Yves Dill.       FAMILY HISTORY: Family History  Problem Relation Age of Onset  . Diabetes type II Mother   .  Heart disease Mother   . Cancer Mother   . Colon cancer Mother   . Diabetes type II Father   . Colon polyps Brother      SOCIAL HISTORY: Social History          Socioeconomic History  . Marital status: Married    Spouse name: Not on file  . Number of children: Not on file  . Years of education: Not on file  . Highest education level: Not on file  Occupational History  . Not on file  Social Needs  . Financial resource strain: Not on file  . Food insecurity:    Worry: Not on file    Inability: Not on file  . Transportation needs:    Medical: Not on file    Non-medical: Not on file  Tobacco Use  . Smoking status: Current Every Day Smoker  . Smokeless tobacco: Never Used  . Tobacco comment: 1/2 ppd  Substance and Sexual Activity  . Alcohol use: No  . Drug use: Not on file  . Sexual activity: Not on file  Other Topics Concern  . Not on file  Social History Narrative  . Not on file      PHYSICAL EXAM:    Vitals:   02/17/18 0804  BP: (!) 144/93  Pulse: 73  Temp: 36.3 C (97.4 F)   Body mass index is 28.77 kg/m. Weight: 87.1 kg (192 lb 0.3 oz)   GENERAL: Alert, active, oriented x3  HEENT: Pupils equal reactive to light. Extraocular movements are intact. Sclera clear. Palpebral conjunctiva normal red color.Pharynx clear. On the posterior scalp there is a 5 cm round mass, mildly mobile, mild tender to palpation, no cellulitis, no drainage. On the anterior face there is a 1.5 cm round mass mobile, soft, mild tender. No secretions. No cellulitis.   NECK: Supple with no palpable mass and no adenopathy.  LUNGS: Sound clear with no rales rhonchi or wheezes.  HEART: Regular rhythm S1 and S2 without murmur.  ABDOMEN: Soft and depressible, nontender with no palpable mass, no hepatomegaly.   EXTREMITIES: Well-developed well-nourished symmetrical with no dependent edema.  NEUROLOGICAL: Awake alert oriented, facial expression symmetrical,  moving all extremities.  REVIEW OF DATA: I have reviewed the following data today:      Office Visit on 01/04/2018  Component Date Value  . Glucose 01/04/2018 144*  . Sodium 01/04/2018 141   . Potassium 01/04/2018 3.9   . Chloride 01/04/2018 103   . Carbon Dioxide (CO2) 01/04/2018 33.3*  . Urea Nitrogen (BUN) 01/04/2018 22   . Creatinine 01/04/2018 0.9   . Glomerular Filtration Ra* 01/04/2018 82   . Calcium 01/04/2018 9.5   . AST  01/04/2018 19   . ALT  01/04/2018 26   . Alk Phos (alkaline Phosp* 01/04/2018 62   . Albumin 01/04/2018 4.4   . Bilirubin, Total 01/04/2018 0.7   . Protein, Total 01/04/2018 7.2   . A/G Ratio 01/04/2018 1.6   . WBC (White Blood Cell Co* 01/04/2018 6.3   .  RBC (Red Blood Cell Coun* 01/04/2018 4.75   . Hemoglobin 01/04/2018 14.9   . Hematocrit 01/04/2018 44.3   . MCV (Mean Corpuscular Vo* 01/04/2018 93.3   . MCH (Mean Corpuscular He* 01/04/2018 31.4*  . MCHC (Mean Corpuscular H* 01/04/2018 33.6   . Platelet Count 01/04/2018 159   . RDW-CV (Red Cell Distrib* 01/04/2018 13.6   . MPV (Mean Platelet Volum* 01/04/2018 11.6   . Neutrophils 01/04/2018 3.51   . Lymphocytes 01/04/2018 1.94   . Monocytes 01/04/2018 0.52   . Eosinophils 01/04/2018 0.21   . Basophils 01/04/2018 0.09   . Neutrophil % 01/04/2018 55.9   . Lymphocyte % 01/04/2018 30.9   . Monocyte % 01/04/2018 8.3   . Eosinophil % 01/04/2018 3.3   . Basophil% 01/04/2018 1.4   . Immature Granulocyte % 01/04/2018 0.2   . Immature Granulocyte Cou* 01/04/2018 0.01   . Hemoglobin A1C 01/04/2018 7.2*  . Average Blood Glucose (C* 01/04/2018 160      ASSESSMENT: Mr. Kneeland is a 77 y.o. male presenting for consultation for cyst of the posterior scalp and face. The patient has a mass on the posterior scalp and face that is causing some pain and discomfort on pressure and has been growing in the last few weeks. Patient oriented about the diagnosis of epidermal cysts, not a malignant lesions but if it  is causing symptoms, excision can be considered. Patient oriented about the procedure, benefits and risk. Due to location and size on the scalp and face, I recommend to do the procedure in the OR for better control of bleeding and pain and comfort for patient.    PLAN: 1. Excision of scalp cyst and face (11426, 11442) 2. CBC, CMP done 01/04/18 3. Stop Aspirin 5 days before surgery 4. Contact us if has any question or concern.   Patient and his wife verbalized understanding, all questions were answered, and were agreeable with the plan outlined above.   Herbert Pun, MD  Electronically signed by Herbert Pun, MD

## 2018-02-17 NOTE — H&P (Signed)
PATIENT PROFILE: Jonathon Barrera is a 77 y.o. male who presents to the Clinic for consultation at the request of Dr. Doy Barrera for evaluation of epidermal inclusion cyst.  PCP:  Jonathon Crouch, MD  HISTORY OF PRESENT ILLNESS: [name] reports having a cyst on his back scalp and face since many years ago. In the last few weeks he has been feeling that both cyst has been growing in size. Refers that in these last few weeks it start to cause pain and discomfort. Pain does not radiates. Pain is aggravated with pressure. No alleviator factor. Patient denies previous infection or secretions.    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 Followed by Dr. Yves Barrera      BPH (benign prostatic hyperplasia) Unknown   Hx of colonic polyps Unknown   Overview    Histological report colonoscopy 07/20/05 adenomatous      Hyperlipidemia Unknown   Type 2 diabetes mellitus (CMS-HCC) Unknown      GENERAL REVIEW OF SYSTEMS:   General ROS: negative for - chills, fatigue, fever, weight gain or weight loss Allergy and Immunology ROS: negative for - hives  Hematological and Lymphatic ROS: negative for - bleeding problems or bruising, negative for palpable nodes Endocrine ROS: negative for - heat or cold intolerance, hair changes Respiratory ROS: negative for - cough, shortness of breath or wheezing Cardiovascular ROS: no chest pain or palpitations GI ROS: negative for nausea, vomiting, abdominal pain, diarrhea, constipation Musculoskeletal ROS: negative for - joint swelling or muscle pain Neurological ROS: negative for - confusion, syncope Dermatological ROS: negative for pruritus and rash Psychiatric: negative for anxiety, depression,  difficulty sleeping and memory loss  MEDICATIONS: CurrentMedications        Current Outpatient Medications  Medication Sig Dispense Refill  . aspirin 81 MG EC tablet Take 81 mg by mouth once daily.    Marland Kitchen glimepiride (AMARYL) 2 MG tablet TAKE 1 TABLET (2 MG TOTAL) BY MOUTH DAILY WITH BREAKFAST. 90 tablet 3  . losartan-hydrochlorothiazide (HYZAAR) 100-25 mg tablet Take 1 tablet by mouth once daily 90 tablet 3  . metFORMIN (GLUCOPHAGE) 1000 MG tablet Take 1 tablet (1,000 mg total) by mouth 2 (two) times daily with meals 180 tablet 1  . MULTIVIT-MIN/HRB CB121 (URINOZINC PROSTATE FORMULA ORAL) Take by mouth once daily.      . tamsulosin (FLOMAX) 0.4 mg capsule Take 0.4 mg by mouth as needed. Take 30 minutes after same meal each day.     No current facility-administered medications for this visit.       ALLERGIES: Patient has no known allergies.  PAST MEDICAL HISTORY:     Past Medical History:  Diagnosis Date  . Bladder carcinoma (CMS-HCC)    papillary-transitional cell 05/26/2007 Jonathon Barrera). Followed by Dr. Yves Barrera  . BPH (benign prostatic hyperplasia)   . COPD (chronic obstructive pulmonary disease) (CMS-HCC)   . Diverticulosis 11/05/2014, 11/14/2009  . Hx of colonic polyps    Histological report colonoscopy 07/20/05 adenomatous  . Hyperlipidemia   . Hyperplastic colon polyp 11/05/2014, 01/29/1999  . Hypertension   . Tobacco abuse   . Tubular adenoma of colon, unspecified 08/14/2005  . Type 2 diabetes mellitus (CMS-HCC)     PAST  SURGICAL HISTORY:      Past Surgical History:  Procedure Laterality Date  . COLONOSCOPY  11/14/2009   PHx polyps/FHx CC-Mother/FHx CP-Brother  . COLONOSCOPY  11/05/2014   Hyperplastic colon polyp/PHx polyps/FHx CC-Mother/FHx CP-Brother/Repeat 50yr/PYO  . Right inguinal hernia repair    . TUR-BT on 06/20/2007 (Jonathon Barrera.       FAMILY HISTORY: Family History  Problem Relation Age of Onset  . Diabetes type II Mother   .  Heart disease Mother   . Cancer Mother   . Colon cancer Mother   . Diabetes type II Father   . Colon polyps Brother      SOCIAL HISTORY: Social History          Socioeconomic History  . Marital status: Married    Spouse name: Not on file  . Number of children: Not on file  . Years of education: Not on file  . Highest education level: Not on file  Occupational History  . Not on file  Social Needs  . Financial resource strain: Not on file  . Food insecurity:    Worry: Not on file    Inability: Not on file  . Transportation needs:    Medical: Not on file    Non-medical: Not on file  Tobacco Use  . Smoking status: Current Every Day Smoker  . Smokeless tobacco: Never Used  . Tobacco comment: 1/2 ppd  Substance and Sexual Activity  . Alcohol use: No  . Drug use: Not on file  . Sexual activity: Not on file  Other Topics Concern  . Not on file  Social History Narrative  . Not on file      PHYSICAL EXAM:    Vitals:   02/17/18 0804  BP: (!) 144/93  Pulse: 73  Temp: 36.3 C (97.4 F)   Body mass index is 28.77 kg/m. Weight: 87.1 kg (192 lb 0.3 oz)   GENERAL: Alert, active, oriented x3  HEENT: Pupils equal reactive to light. Extraocular movements are intact. Sclera clear. Palpebral conjunctiva normal red color.Pharynx clear. On the posterior scalp there is a 5 cm round mass, mildly mobile, mild tender to palpation, no cellulitis, no drainage. On the anterior face there is a 1.5 cm round mass mobile, soft, mild tender. No secretions. No cellulitis.   NECK: Supple with no palpable mass and no adenopathy.  LUNGS: Sound clear with no rales rhonchi or wheezes.  HEART: Regular rhythm S1 and S2 without murmur.  ABDOMEN: Soft and depressible, nontender with no palpable mass, no hepatomegaly.   EXTREMITIES: Well-developed well-nourished symmetrical with no dependent edema.  NEUROLOGICAL: Awake alert oriented, facial expression symmetrical,  moving all extremities.  REVIEW OF DATA: I have reviewed the following data today:      Office Visit on 01/04/2018  Component Date Value  . Glucose 01/04/2018 144*  . Sodium 01/04/2018 141   . Potassium 01/04/2018 3.9   . Chloride 01/04/2018 103   . Carbon Dioxide (CO2) 01/04/2018 33.3*  . Urea Nitrogen (BUN) 01/04/2018 22   . Creatinine 01/04/2018 0.9   . Glomerular Filtration Ra* 01/04/2018 82   . Calcium 01/04/2018 9.5   . AST  01/04/2018 19   . ALT  01/04/2018 26   . Alk Phos (alkaline Phosp* 01/04/2018 62   . Albumin 01/04/2018 4.4   . Bilirubin, Total 01/04/2018 0.7   . Protein, Total 01/04/2018 7.2   . A/G Ratio 01/04/2018 1.6   . WBC (White Blood Cell Co* 01/04/2018 6.3   .  RBC (Red Blood Cell Coun* 01/04/2018 4.75   . Hemoglobin 01/04/2018 14.9   . Hematocrit 01/04/2018 44.3   . MCV (Mean Corpuscular Vo* 01/04/2018 93.3   . MCH (Mean Corpuscular He* 01/04/2018 31.4*  . MCHC (Mean Corpuscular H* 01/04/2018 33.6   . Platelet Count 01/04/2018 159   . RDW-CV (Red Cell Distrib* 01/04/2018 13.6   . MPV (Mean Platelet Volum* 01/04/2018 11.6   . Neutrophils 01/04/2018 3.51   . Lymphocytes 01/04/2018 1.94   . Monocytes 01/04/2018 0.52   . Eosinophils 01/04/2018 0.21   . Basophils 01/04/2018 0.09   . Neutrophil % 01/04/2018 55.9   . Lymphocyte % 01/04/2018 30.9   . Monocyte % 01/04/2018 8.3   . Eosinophil % 01/04/2018 3.3   . Basophil% 01/04/2018 1.4   . Immature Granulocyte % 01/04/2018 0.2   . Immature Granulocyte Cou* 01/04/2018 0.01   . Hemoglobin A1C 01/04/2018 7.2*  . Average Blood Glucose (C* 01/04/2018 160      ASSESSMENT: Mr. Kneeland is a 77 y.o. male presenting for consultation for cyst of the posterior scalp and face. The patient has a mass on the posterior scalp and face that is causing some pain and discomfort on pressure and has been growing in the last few weeks. Patient oriented about the diagnosis of epidermal cysts, not a malignant lesions but if it  is causing symptoms, excision can be considered. Patient oriented about the procedure, benefits and risk. Due to location and size on the scalp and face, I recommend to do the procedure in the OR for better control of bleeding and pain and comfort for patient.    PLAN: 1. Excision of scalp cyst and face (11426, 11442) 2. CBC, CMP done 01/04/18 3. Stop Aspirin 5 days before surgery 4. Contact us if has any question or concern.   Patient and his wife verbalized understanding, all questions were answered, and were agreeable with the plan outlined above.   Herbert Pun, MD  Electronically signed by Herbert Pun, MD

## 2018-02-21 ENCOUNTER — Encounter
Admission: RE | Admit: 2018-02-21 | Discharge: 2018-02-21 | Disposition: A | Discharge status: Home / Self Care | Payer: PPO | Source: Ambulatory Visit | Attending: General Surgery | Admitting: General Surgery

## 2018-02-21 ENCOUNTER — Other Ambulatory Visit: Payer: Self-pay

## 2018-02-21 DIAGNOSIS — N400 Enlarged prostate without lower urinary tract symptoms: Secondary | ICD-10-CM | POA: Diagnosis not present

## 2018-02-21 DIAGNOSIS — Z8551 Personal history of malignant neoplasm of bladder: Secondary | ICD-10-CM | POA: Diagnosis not present

## 2018-02-21 DIAGNOSIS — F17200 Nicotine dependence, unspecified, uncomplicated: Secondary | ICD-10-CM | POA: Diagnosis not present

## 2018-02-21 DIAGNOSIS — J449 Chronic obstructive pulmonary disease, unspecified: Secondary | ICD-10-CM | POA: Diagnosis not present

## 2018-02-21 DIAGNOSIS — Z79899 Other long term (current) drug therapy: Secondary | ICD-10-CM | POA: Diagnosis not present

## 2018-02-21 DIAGNOSIS — Z7984 Long term (current) use of oral hypoglycemic drugs: Secondary | ICD-10-CM | POA: Diagnosis not present

## 2018-02-21 DIAGNOSIS — L720 Epidermal cyst: Secondary | ICD-10-CM | POA: Diagnosis not present

## 2018-02-21 DIAGNOSIS — Z833 Family history of diabetes mellitus: Secondary | ICD-10-CM | POA: Diagnosis not present

## 2018-02-21 DIAGNOSIS — Z7982 Long term (current) use of aspirin: Secondary | ICD-10-CM | POA: Diagnosis not present

## 2018-02-21 DIAGNOSIS — Z01818 Encounter for other preprocedural examination: Secondary | ICD-10-CM | POA: Insufficient documentation

## 2018-02-21 DIAGNOSIS — I4510 Unspecified right bundle-branch block: Secondary | ICD-10-CM | POA: Insufficient documentation

## 2018-02-21 DIAGNOSIS — Z8249 Family history of ischemic heart disease and other diseases of the circulatory system: Secondary | ICD-10-CM | POA: Diagnosis not present

## 2018-02-21 DIAGNOSIS — Z8371 Family history of colonic polyps: Secondary | ICD-10-CM | POA: Diagnosis not present

## 2018-02-21 DIAGNOSIS — R220 Localized swelling, mass and lump, head: Secondary | ICD-10-CM | POA: Diagnosis present

## 2018-02-21 DIAGNOSIS — E119 Type 2 diabetes mellitus without complications: Secondary | ICD-10-CM | POA: Diagnosis not present

## 2018-02-21 DIAGNOSIS — Z86010 Personal history of colonic polyps: Secondary | ICD-10-CM | POA: Diagnosis not present

## 2018-02-21 DIAGNOSIS — I10 Essential (primary) hypertension: Secondary | ICD-10-CM | POA: Diagnosis not present

## 2018-02-21 DIAGNOSIS — Z800 Family history of malignant neoplasm of digestive organs: Secondary | ICD-10-CM | POA: Diagnosis not present

## 2018-02-21 DIAGNOSIS — Z809 Family history of malignant neoplasm, unspecified: Secondary | ICD-10-CM | POA: Diagnosis not present

## 2018-02-21 DIAGNOSIS — E785 Hyperlipidemia, unspecified: Secondary | ICD-10-CM | POA: Diagnosis not present

## 2018-02-21 HISTORY — DX: Reserved for concepts with insufficient information to code with codable children: IMO0002

## 2018-02-21 HISTORY — DX: Localized swelling, mass and lump, unspecified: R22.9

## 2018-02-21 HISTORY — DX: Disorder of the skin and subcutaneous tissue, unspecified: L98.9

## 2018-02-21 NOTE — Patient Instructions (Addendum)
Your procedure is scheduled on: 02/23/18 Wed Report to Same Day Surgery 2nd floor medical mall [facility] Entrance-take elevator on left to 2nd floor.  Check in with surgery information desk.) To find out your arrival time please call [phone] between 1PM - 3PM on 02/22/18 Tues  Remember: Instructions that are not followed completely may result in serious medical risk, up to and including death, or upon the discretion of your surgeon and anesthesiologist your surgery may need to be rescheduled.    _x___ 1. Do not eat food after midnight the night before your procedure. You may drink clear liquids up to 2 hours before you are scheduled to arrive at the hospital for your procedure.  Do not drink clear liquids within 2 hours of your scheduled arrival to the hospital.  Clear liquids include  --Water or Apple juice without pulp  --Clear carbohydrate beverage such as ClearFast or Gatorade  --Black Coffee or Clear Tea (No milk, no creamers, do not add anything to                  the coffee or Tea Type 1 and type 2 diabetics should only drink water.   ____Ensure clear carbohydrate drink on the way to the hospital for bariatric patients  ____Ensure clear carbohydrate drink 3 hours before surgery for Dr Dwyane Luo patients if physician instructed.   No gum chewing or hard candies.     __x__ 2. No Alcohol for 24 hours before or after surgery.   __x__3. No Smoking or e-cigarettes for 24 prior to surgery.  Do not use any chewable tobacco products for at least 6 hour prior to surgery   ____  4. Bring all medications with you on the day of surgery if instructed.    __x__ 5. Notify your doctor if there is any change in your medical condition     (cold, fever, infections).    x___6. On the morning of surgery brush your teeth with toothpaste and water.  You may rinse your mouth with mouth wash if you wish.  Do not swallow any toothpaste or mouthwash.   Do not wear jewelry, make-up, hairpins,  clips or nail polish.  Do not wear lotions, powders, or perfumes. You may wear deodorant.  Do not shave 48 hours prior to surgery. Men may shave face and neck.  Do not bring valuables to the hospital.    [facility] is not responsible for any belongings or valuables.               Contacts, dentures or bridgework may not be worn into surgery.  Leave your suitcase in the car. After surgery it may be brought to your room.  For patients admitted to the hospital, discharge time is determined by your                       treatment team.  _  Patients discharged the day of surgery will not be allowed to drive home.  You will need someone to drive you home and stay with you the night of your procedure.    Please read over the following fact sheets that you were given:   [facility] Preparing for Surgery and or MRSA Information   _x___ Take anti-hypertensive listed below, cardiac, seizure, asthma,     anti-reflux and psychiatric medicines. These include:  1. None  2.  3.  4.  5.  6.  ____Fleets enema or Magnesium Citrate as directed.  ____ Use CHG Soap or sage wipes as directed on instruction sheet   ____ Use inhalers on the day of surgery and bring to hospital day of surgery  __x__ Stop Metformin and Janumet 2 days prior to surgery.    ____ Take 1/2 of usual insulin dose the night before surgery and none on the morning     surgery.   _x___ Follow recommendations from Cardiologist, Pulmonologist or PCP regarding          stopping Aspirin, Coumadin, Plavix ,Eliquis, Effient, or Pradaxa, and Pletal.  Stopped Aspirin on 02/17/18  X____Stop Anti-inflammatories such as Advil, Aleve, Ibuprofen, Motrin, Naproxen, Naprosyn, Goodies powders or aspirin products. OK to take Tylenol and                          Celebrex.   _x___ Stop supplements until after surgery.  But may continue Vitamin D, Vitamin B,       and multivitamin.   ____ Bring C-Pap to the hospital.

## 2018-02-22 NOTE — Pre-Procedure Instructions (Signed)
EKG sent to Anesthesia for review. 

## 2018-02-22 NOTE — Pre-Procedure Instructions (Signed)
EKG NOTED RBBB

## 2018-02-23 ENCOUNTER — Encounter
Admission: RE | Disposition: A | Discharge status: Home / Self Care | Payer: Self-pay | Source: Ambulatory Visit | Attending: General Surgery

## 2018-02-23 ENCOUNTER — Ambulatory Visit: Payer: PPO

## 2018-02-23 ENCOUNTER — Other Ambulatory Visit: Payer: Self-pay

## 2018-02-23 ENCOUNTER — Ambulatory Visit
Admission: RE | Admit: 2018-02-23 | Discharge: 2018-02-23 | Disposition: A | Discharge status: Home / Self Care | Payer: PPO | Source: Ambulatory Visit | Attending: General Surgery | Admitting: General Surgery

## 2018-02-23 ENCOUNTER — Encounter: Payer: Self-pay | Admitting: *Deleted

## 2018-02-23 DIAGNOSIS — Z8551 Personal history of malignant neoplasm of bladder: Secondary | ICD-10-CM | POA: Insufficient documentation

## 2018-02-23 DIAGNOSIS — Z809 Family history of malignant neoplasm, unspecified: Secondary | ICD-10-CM | POA: Insufficient documentation

## 2018-02-23 DIAGNOSIS — F17200 Nicotine dependence, unspecified, uncomplicated: Secondary | ICD-10-CM | POA: Insufficient documentation

## 2018-02-23 DIAGNOSIS — J449 Chronic obstructive pulmonary disease, unspecified: Secondary | ICD-10-CM | POA: Insufficient documentation

## 2018-02-23 DIAGNOSIS — Z7984 Long term (current) use of oral hypoglycemic drugs: Secondary | ICD-10-CM | POA: Insufficient documentation

## 2018-02-23 DIAGNOSIS — E119 Type 2 diabetes mellitus without complications: Secondary | ICD-10-CM | POA: Insufficient documentation

## 2018-02-23 DIAGNOSIS — I10 Essential (primary) hypertension: Secondary | ICD-10-CM | POA: Diagnosis not present

## 2018-02-23 DIAGNOSIS — E785 Hyperlipidemia, unspecified: Secondary | ICD-10-CM | POA: Insufficient documentation

## 2018-02-23 DIAGNOSIS — L720 Epidermal cyst: Secondary | ICD-10-CM | POA: Diagnosis not present

## 2018-02-23 DIAGNOSIS — Z800 Family history of malignant neoplasm of digestive organs: Secondary | ICD-10-CM | POA: Insufficient documentation

## 2018-02-23 DIAGNOSIS — Z86010 Personal history of colonic polyps: Secondary | ICD-10-CM | POA: Insufficient documentation

## 2018-02-23 DIAGNOSIS — N400 Enlarged prostate without lower urinary tract symptoms: Secondary | ICD-10-CM | POA: Insufficient documentation

## 2018-02-23 DIAGNOSIS — L723 Sebaceous cyst: Secondary | ICD-10-CM | POA: Diagnosis not present

## 2018-02-23 DIAGNOSIS — Z7982 Long term (current) use of aspirin: Secondary | ICD-10-CM | POA: Insufficient documentation

## 2018-02-23 DIAGNOSIS — Z833 Family history of diabetes mellitus: Secondary | ICD-10-CM | POA: Insufficient documentation

## 2018-02-23 DIAGNOSIS — R220 Localized swelling, mass and lump, head: Secondary | ICD-10-CM | POA: Diagnosis not present

## 2018-02-23 DIAGNOSIS — Z8371 Family history of colonic polyps: Secondary | ICD-10-CM | POA: Insufficient documentation

## 2018-02-23 DIAGNOSIS — Z8249 Family history of ischemic heart disease and other diseases of the circulatory system: Secondary | ICD-10-CM | POA: Insufficient documentation

## 2018-02-23 DIAGNOSIS — Z79899 Other long term (current) drug therapy: Secondary | ICD-10-CM | POA: Insufficient documentation

## 2018-02-23 HISTORY — PX: LESION EXCISION: SHX5167

## 2018-02-23 LAB — GLUCOSE, CAPILLARY
Glucose-Capillary: 119 mg/dL — ABNORMAL HIGH (ref 70–99)
Glucose-Capillary: 126 mg/dL — ABNORMAL HIGH (ref 70–99)

## 2018-02-23 SURGERY — EXCISION, LESION, SCALP
Anesthesia: General | Wound class: Clean

## 2018-02-23 MED ORDER — KETAMINE HCL 50 MG/ML IJ SOLN
INTRAMUSCULAR | Status: AC
  Filled 2018-02-23: qty 10

## 2018-02-23 MED ORDER — FENTANYL CITRATE (PF) 100 MCG/2ML IJ SOLN
25.0000 ug | INTRAMUSCULAR | Status: DC | PRN
  Administered 2018-02-23: 25 ug via INTRAVENOUS

## 2018-02-23 MED ORDER — BUPIVACAINE-EPINEPHRINE 0.5% -1:200000 IJ SOLN
INTRAMUSCULAR | Status: DC | PRN
  Administered 2018-02-23: 12 mL

## 2018-02-23 MED ORDER — EPHEDRINE SULFATE 50 MG/ML IJ SOLN
INTRAMUSCULAR | Status: AC
  Filled 2018-02-23: qty 1

## 2018-02-23 MED ORDER — PHENYLEPHRINE HCL 10 MG/ML IJ SOLN
INTRAMUSCULAR | Status: DC | PRN
  Administered 2018-02-23 (×2): 50 ug via INTRAVENOUS

## 2018-02-23 MED ORDER — ONDANSETRON HCL 4 MG/2ML IJ SOLN
INTRAMUSCULAR | Status: DC | PRN
  Administered 2018-02-23: 4 mg via INTRAVENOUS

## 2018-02-23 MED ORDER — CEFAZOLIN SODIUM-DEXTROSE 2-4 GM/100ML-% IV SOLN
INTRAVENOUS | Status: AC
  Filled 2018-02-23: qty 100

## 2018-02-23 MED ORDER — ONDANSETRON HCL 4 MG/2ML IJ SOLN: 4.0000 mg | Freq: Once | INTRAMUSCULAR | Status: DC | PRN

## 2018-02-23 MED ORDER — PROPOFOL 10 MG/ML IV BOLUS
INTRAVENOUS | Status: AC
  Filled 2018-02-23: qty 20

## 2018-02-23 MED ORDER — MIDAZOLAM HCL 2 MG/2ML IJ SOLN
INTRAMUSCULAR | Status: AC
  Filled 2018-02-23: qty 2

## 2018-02-23 MED ORDER — PROPOFOL 500 MG/50ML IV EMUL
INTRAVENOUS | Status: DC | PRN
  Administered 2018-02-23: 100 ug/kg/min via INTRAVENOUS

## 2018-02-23 MED ORDER — LIDOCAINE HCL (PF) 2 % IJ SOLN
INTRAMUSCULAR | Status: AC
  Filled 2018-02-23: qty 10

## 2018-02-23 MED ORDER — CEFAZOLIN SODIUM-DEXTROSE 2-3 GM-%(50ML) IV SOLR
INTRAVENOUS | Status: DC | PRN
  Administered 2018-02-23: 2 g via INTRAVENOUS

## 2018-02-23 MED ORDER — CEFAZOLIN SODIUM-DEXTROSE 2-4 GM/100ML-% IV SOLN
2.0000 g | INTRAVENOUS | Status: AC
  Administered 2018-02-23: 2 g via INTRAVENOUS

## 2018-02-23 MED ORDER — SODIUM CHLORIDE 0.9 % IV SOLN
INTRAVENOUS | Status: DC
  Administered 2018-02-23: 09:00:00 via INTRAVENOUS

## 2018-02-23 MED ORDER — HYDROCODONE-ACETAMINOPHEN 5-325 MG PO TABS: 1.0000 | ORAL_TABLET | ORAL | 0 refills | Status: AC | PRN

## 2018-02-23 MED ORDER — FENTANYL CITRATE (PF) 100 MCG/2ML IJ SOLN
INTRAMUSCULAR | Status: AC
  Filled 2018-02-23: qty 2

## 2018-02-23 MED ORDER — DEXMEDETOMIDINE HCL 200 MCG/2ML IV SOLN
INTRAVENOUS | Status: DC | PRN
  Administered 2018-02-23: 8 ug via INTRAVENOUS
  Administered 2018-02-23 (×4): 4 ug via INTRAVENOUS
  Administered 2018-02-23 (×2): 8 ug via INTRAVENOUS

## 2018-02-23 MED ORDER — ONDANSETRON HCL 4 MG/2ML IJ SOLN
INTRAMUSCULAR | Status: AC
  Filled 2018-02-23: qty 2

## 2018-02-23 MED ORDER — KETAMINE HCL 10 MG/ML IJ SOLN
INTRAMUSCULAR | Status: DC | PRN
  Administered 2018-02-23: 10 mg via INTRAVENOUS
  Administered 2018-02-23: 20 mg via INTRAVENOUS

## 2018-02-23 MED ORDER — EPHEDRINE SULFATE 50 MG/ML IJ SOLN
INTRAMUSCULAR | Status: DC | PRN
  Administered 2018-02-23: 5 mg via INTRAVENOUS

## 2018-02-23 MED ORDER — LIDOCAINE HCL (CARDIAC) PF 100 MG/5ML IV SOSY
PREFILLED_SYRINGE | INTRAVENOUS | Status: DC | PRN
  Administered 2018-02-23: 40 mg via INTRAVENOUS

## 2018-02-23 MED ORDER — FAMOTIDINE 20 MG PO TABS
20.0000 mg | ORAL_TABLET | Freq: Once | ORAL | Status: AC
  Administered 2018-02-23: 20 mg via ORAL

## 2018-02-23 MED ORDER — FAMOTIDINE 20 MG PO TABS
ORAL_TABLET | ORAL | Status: AC
  Filled 2018-02-23: qty 1

## 2018-02-23 MED ORDER — FENTANYL CITRATE (PF) 100 MCG/2ML IJ SOLN
INTRAMUSCULAR | Status: AC
  Administered 2018-02-23: 25 ug via INTRAVENOUS
  Filled 2018-02-23: qty 2

## 2018-02-23 SURGICAL SUPPLY — 31 items
BLADE SURG 15 STRL LF DISP TIS (BLADE) ×1 IMPLANT
BLADE SURG 15 STRL SS (BLADE) ×2
CHLORAPREP W/TINT 26ML (MISCELLANEOUS) ×3 IMPLANT
CNTNR SPEC 2.5X3XGRAD LEK (MISCELLANEOUS) ×1
CONT SPEC 4OZ STER OR WHT (MISCELLANEOUS) ×2
CONTAINER SPEC 2.5X3XGRAD LEK (MISCELLANEOUS) ×1 IMPLANT
COVER WAND RF STERILE (DRAPES) ×3 IMPLANT
DERMABOND ADVANCED (GAUZE/BANDAGES/DRESSINGS) ×2
DERMABOND ADVANCED .7 DNX12 (GAUZE/BANDAGES/DRESSINGS) ×1 IMPLANT
DRAPE LAPAROTOMY 77X122 PED (DRAPES) ×3 IMPLANT
ELECT REM PT RETURN 9FT ADLT (ELECTROSURGICAL) ×3
ELECTRODE REM PT RTRN 9FT ADLT (ELECTROSURGICAL) ×1 IMPLANT
GLOVE BIO SURGEON STRL SZ 6.5 (GLOVE) ×2 IMPLANT
GLOVE BIO SURGEONS STRL SZ 6.5 (GLOVE) ×1
GLOVE BIOGEL PI IND STRL 6.5 (GLOVE) ×1 IMPLANT
GLOVE BIOGEL PI INDICATOR 6.5 (GLOVE) ×2
KIT TURNOVER KIT A (KITS) ×3 IMPLANT
LABEL OR SOLS (LABEL) ×3 IMPLANT
NEEDLE HYPO 22GX1.5 SAFETY (NEEDLE) ×3 IMPLANT
NEEDLE HYPO 25X1 1.5 SAFETY (NEEDLE) ×3 IMPLANT
NS IRRIG 500ML POUR BTL (IV SOLUTION) ×3 IMPLANT
PACK BASIN MINOR ARMC (MISCELLANEOUS) ×3 IMPLANT
SUT ETHILON 3-0 (SUTURE) ×3 IMPLANT
SUT MNCRL 4-0 (SUTURE) ×2
SUT MNCRL 4-0 27XMFL (SUTURE) ×1
SUT VIC AB 2-0 SH 27 (SUTURE) ×2
SUT VIC AB 2-0 SH 27XBRD (SUTURE) ×1 IMPLANT
SUT VIC AB 3-0 SH 27 (SUTURE) ×2
SUT VIC AB 3-0 SH 27X BRD (SUTURE) ×1 IMPLANT
SUTURE MNCRL 4-0 27XMF (SUTURE) ×1 IMPLANT
SYR 10ML LL (SYRINGE) ×3 IMPLANT

## 2018-02-23 NOTE — Interval H&P Note (Signed)
History and Physical Interval Note:  02/23/2018 9:07 AM  Jonathon Barrera  has presented today for surgery, with the diagnosis of SCALP MASS,EPIDERMAL CYST OF FACE  The various methods of treatment have been discussed with the patient and family. After consideration of risks, benefits and other options for treatment, the patient has consented to  Procedure(s): EXCISION SCALP CYST AND FACIAL CYST (N/A) as a surgical intervention .  The patient's history has been reviewed, patient examined, no change in status, stable for surgery.  I have reviewed the patient's chart and labs.  The face mass and posterior scalp mass were marked in the pre procedure room. Questions were answered to the patient's satisfaction.     Herbert Pun

## 2018-02-23 NOTE — Anesthesia Post-op Follow-up Note (Signed)
Anesthesia QCDR form completed.        

## 2018-02-23 NOTE — Addendum Note (Signed)
Addendum  created 02/23/18 1453 by Jonna Clark, CRNA   Charge Capture section accepted

## 2018-02-23 NOTE — Anesthesia Preprocedure Evaluation (Signed)
Anesthesia Evaluation  Patient identified by MRN, date of birth, ID band Patient awake    Reviewed: Allergy & Precautions, H&P , NPO status , Patient's Chart, lab work & pertinent test results, reviewed documented beta blocker date and time   History of Anesthesia Complications Negative for: history of anesthetic complications  Airway Mallampati: III  TM Distance: >3 FB Neck ROM: limited    Dental  (+) Missing, Upper Dentures, Lower Dentures, Dental Advidsory Given   Pulmonary neg shortness of breath, neg sleep apnea, COPD, neg recent URI, Current Smoker,           Cardiovascular Exercise Tolerance: Poor hypertension, (-) angina(-) CAD, (-) Past MI, (-) Cardiac Stents and (-) CABG (-) dysrhythmias (-) Valvular Problems/Murmurs     Neuro/Psych negative neurological ROS  negative psych ROS   GI/Hepatic negative GI ROS, Neg liver ROS,   Endo/Other  diabetes, Type 2  Renal/GU negative Renal ROS  negative genitourinary   Musculoskeletal   Abdominal   Peds  Hematology negative hematology ROS (+)   Anesthesia Other Findings Past Medical History:   COPD (chronic obstructive pulmonary disease)                 Hypertension                                                 Diabetes mellitus without complication                       Cancer                                                       Bladder carcinoma                                            Colon polyps                                                 Hyperlipemia                                                 BPH (benign prostatic hyperplasia)                           Reproductive/Obstetrics negative OB ROS                             Anesthesia Physical  Anesthesia Plan  ASA: III  Anesthesia Plan: General   Post-op Pain Management:    Induction: Intravenous  PONV Risk Score and Plan: 1 and Propofol infusion and TIVA  Airway  Management Planned: Nasal Cannula, Simple Face Mask and Natural Airway  Additional Equipment:   Intra-op Plan:  Post-operative Plan:   Informed Consent: I have reviewed the patients History and Physical, chart, labs and discussed the procedure including the risks, benefits and alternatives for the proposed anesthesia with the patient or authorized representative who has indicated his/her understanding and acceptance.   Dental Advisory Given  Plan Discussed with: Anesthesiologist, CRNA and Surgeon  Anesthesia Plan Comments:         Anesthesia Quick Evaluation

## 2018-02-23 NOTE — Anesthesia Postprocedure Evaluation (Signed)
Anesthesia Post Note  Patient: Jonathon Barrera  Procedure(s) Performed: EXCISION SCALP CYST AND FACIAL CYST (N/A )  Patient location during evaluation: PACU Anesthesia Type: General Level of consciousness: awake and alert Pain management: pain level controlled Vital Signs Assessment: post-procedure vital signs reviewed and stable Respiratory status: spontaneous breathing, nonlabored ventilation, respiratory function stable and patient connected to nasal cannula oxygen Cardiovascular status: blood pressure returned to baseline and stable Postop Assessment: no apparent nausea or vomiting Anesthetic complications: no     Last Vitals:  Vitals:   02/23/18 1148 02/23/18 1202  BP: (!) 146/78 (!) 141/83  Pulse: (!) 50 (!) 54  Resp: 14 16  Temp: (!) 36.3 C   SpO2: 97% 100%    Last Pain:  Vitals:   02/23/18 1202  TempSrc:   PainSc: 2                  Martha Clan

## 2018-02-23 NOTE — Op Note (Signed)
Preoperative diagnosis: 1. Posterior scalp mass. 2. Face mass  Postoperative diagnosis: 1. Posterior scalp mass. 2. Face mass.  Procedure: Excision of posterior scalp mass.                      Excision of face mass  Anesthesia: Sedation  Surgeon: Dr. Windell Moment  Wound Classification: Clean  Indications:  Patient is a 77 y.o. male with a palpable posterior scalp and face mass. The posterior mass was increasing in size and had episode of infection. The anterior face mass is growing and bleeding with shaving. Resection was discussed with patient.   Findings: 1. Palpable mass at posterior scalp and anterior left face.  2. Chronically infected posterior scalp mass 3. Posterior scalp mass was 5 cm in vivo 4. Face mass was 1.5 cm in vivo 5. Adequate hemostasis  Description of procedure: The patient was taken to the operating room and placed left side up position on the operating table, and after sedation was administered, the scal was prepped and draped in the usual sterile fashion. A time-out was completed verifying correct patient, procedure, site, positioning, and implant(s) and/or special equipment prior to beginning this procedure.  Local anesthesia was infiltrated around the palpable lesion. With a blade #15, an elliptical incision was made using the skin lines. Sharp dissection was carried down and lesion was excised including dermal tissue. Deep dermal stitches were done with vicryl 3-0 to approximate skin and skin closed with Monocryl 4-0 in subcuticular fashion. Specimen sent to pathology. Then, the face was prepped in sterile fashion. Using the same technique, an elliptical incision was done and the mass was dissected and resected completely. Skin of face was closed with 5-0 Monocryl.  Patient tolerated procedure well.    Specimen: 1. Posterior scalp mass. 2. Face cyst  Complications: None  Estimated Blood Loss: 5 mL

## 2018-02-23 NOTE — Discharge Instructions (Addendum)
°  Diet: Resume home heart healthy regular diet.   Activity: Light activity and walking are encouraged. Do not drive or drink alcohol if taking narcotic pain medications.  Wound care: Remove dressing tomorrow. Once dressing removed, may shower with soapy water and pat dry (do not rub incisions), but no baths or submerging incision underwater until follow-up. (no swimming)   Medications: Resume all home medications. For mild to moderate pain: acetaminophen (Tylenol) or ibuprofen (if no kidney disease). Combining Tylenol with alcohol can substantially increase your risk of causing liver disease. Narcotic pain medications, if prescribed, can be used for severe pain, though may cause nausea, constipation, and drowsiness. Do not combine Tylenol and Norco within a 6 hour period as Norco contains Tylenol. If you do not need the narcotic pain medication, you do not need to fill the prescription.  Call office [phone]) at any time if any questions, worsening pain, fevers/chills, bleeding, drainage from incision site, or other concerns.    AMBULATORY SURGERY  DISCHARGE INSTRUCTIONS   1) The drugs that you were given will stay in your system until tomorrow so for the next 24 hours you should not:  A) Drive an automobile B) Make any legal decisions C) Drink any alcoholic beverage   2) You may resume regular meals tomorrow.  Today it is better to start with liquids and gradually work up to solid foods.  You may eat anything you prefer, but it is better to start with liquids, then soup and crackers, and gradually work up to solid foods.   3) Please notify your doctor immediately if you have any unusual bleeding, trouble breathing, redness and pain at the surgery site, drainage, fever, or pain not relieved by medication.    4) Additional Instructions:        Please contact your physician with any problems or Same Day Surgery at [phone], Monday through Friday 6 am to 4 pm, or [facility] number at [phone].

## 2018-02-23 NOTE — Anesthesia Procedure Notes (Signed)
Procedure Name: MAC Date/Time: 02/23/2018 9:38 AM Performed by: Martha Clan, MD Pre-anesthesia Checklist: Patient identified, Emergency Drugs available, Suction available, Patient being monitored and Timeout performed Oxygen Delivery Method: Nasal cannula

## 2018-02-23 NOTE — Transfer of Care (Signed)
Immediate Anesthesia Transfer of Care Note  Patient: Jonathon Barrera  Procedure(s) Performed: EXCISION SCALP CYST AND FACIAL CYST (N/A )  Patient Location: PACU  Anesthesia Type:MAC  Level of Consciousness: alert   Airway & Oxygen Therapy: Patient Spontanous Breathing  Post-op Assessment: Report given to RN  Post vital signs: stable  Last Vitals:  Vitals Value Taken Time  BP 110/68 02/23/2018 11:04 AM  Temp 36.3 C 02/23/2018 11:04 AM  Pulse 66 02/23/2018 11:08 AM  Resp 16 02/23/2018 11:08 AM  SpO2 97 % 02/23/2018 11:08 AM  Vitals shown include unvalidated device data.  Last Pain:  Vitals:   02/23/18 1104  TempSrc:   PainSc: 0-No pain         Complications: No apparent anesthesia complications

## 2018-02-24 LAB — SURGICAL PATHOLOGY

## 2018-04-08 DIAGNOSIS — E119 Type 2 diabetes mellitus without complications: Secondary | ICD-10-CM | POA: Diagnosis not present

## 2018-05-03 DIAGNOSIS — E1142 Type 2 diabetes mellitus with diabetic polyneuropathy: Secondary | ICD-10-CM | POA: Diagnosis not present

## 2018-05-03 DIAGNOSIS — M79671 Pain in right foot: Secondary | ICD-10-CM | POA: Diagnosis not present

## 2018-05-03 DIAGNOSIS — M79672 Pain in left foot: Secondary | ICD-10-CM | POA: Diagnosis not present

## 2018-05-18 DIAGNOSIS — D229 Melanocytic nevi, unspecified: Secondary | ICD-10-CM | POA: Diagnosis not present

## 2018-05-18 DIAGNOSIS — L570 Actinic keratosis: Secondary | ICD-10-CM | POA: Diagnosis not present

## 2018-05-18 DIAGNOSIS — L720 Epidermal cyst: Secondary | ICD-10-CM | POA: Diagnosis not present

## 2018-05-18 DIAGNOSIS — D2230 Melanocytic nevi of unspecified part of face: Secondary | ICD-10-CM | POA: Diagnosis not present

## 2018-05-18 DIAGNOSIS — L821 Other seborrheic keratosis: Secondary | ICD-10-CM | POA: Diagnosis not present

## 2018-05-18 DIAGNOSIS — L578 Other skin changes due to chronic exposure to nonionizing radiation: Secondary | ICD-10-CM | POA: Diagnosis not present

## 2018-05-18 DIAGNOSIS — L820 Inflamed seborrheic keratosis: Secondary | ICD-10-CM | POA: Diagnosis not present

## 2018-05-18 DIAGNOSIS — Z1283 Encounter for screening for malignant neoplasm of skin: Secondary | ICD-10-CM | POA: Diagnosis not present

## 2018-05-18 DIAGNOSIS — D225 Melanocytic nevi of trunk: Secondary | ICD-10-CM | POA: Diagnosis not present

## 2018-05-18 DIAGNOSIS — L812 Freckles: Secondary | ICD-10-CM | POA: Diagnosis not present

## 2018-07-05 DIAGNOSIS — I10 Essential (primary) hypertension: Secondary | ICD-10-CM | POA: Diagnosis not present

## 2018-07-05 DIAGNOSIS — Z125 Encounter for screening for malignant neoplasm of prostate: Secondary | ICD-10-CM | POA: Diagnosis not present

## 2018-07-05 DIAGNOSIS — Z79899 Other long term (current) drug therapy: Secondary | ICD-10-CM | POA: Diagnosis not present

## 2018-07-05 DIAGNOSIS — J449 Chronic obstructive pulmonary disease, unspecified: Secondary | ICD-10-CM | POA: Diagnosis not present

## 2018-07-05 DIAGNOSIS — Z0000 Encounter for general adult medical examination without abnormal findings: Secondary | ICD-10-CM | POA: Diagnosis not present

## 2018-07-05 DIAGNOSIS — E1142 Type 2 diabetes mellitus with diabetic polyneuropathy: Secondary | ICD-10-CM | POA: Diagnosis not present

## 2018-07-05 DIAGNOSIS — J431 Panlobular emphysema: Secondary | ICD-10-CM | POA: Diagnosis not present

## 2018-07-05 DIAGNOSIS — E7800 Pure hypercholesterolemia, unspecified: Secondary | ICD-10-CM | POA: Diagnosis not present

## 2018-10-18 DIAGNOSIS — E1142 Type 2 diabetes mellitus with diabetic polyneuropathy: Secondary | ICD-10-CM | POA: Diagnosis not present

## 2018-10-18 DIAGNOSIS — Z79899 Other long term (current) drug therapy: Secondary | ICD-10-CM | POA: Diagnosis not present

## 2018-10-18 DIAGNOSIS — I10 Essential (primary) hypertension: Secondary | ICD-10-CM | POA: Diagnosis not present

## 2018-10-18 DIAGNOSIS — E7800 Pure hypercholesterolemia, unspecified: Secondary | ICD-10-CM | POA: Diagnosis not present

## 2018-11-14 DIAGNOSIS — L820 Inflamed seborrheic keratosis: Secondary | ICD-10-CM | POA: Diagnosis not present

## 2018-11-14 DIAGNOSIS — L570 Actinic keratosis: Secondary | ICD-10-CM | POA: Diagnosis not present

## 2018-11-14 DIAGNOSIS — Z85820 Personal history of malignant melanoma of skin: Secondary | ICD-10-CM | POA: Diagnosis not present

## 2018-11-14 DIAGNOSIS — Z1283 Encounter for screening for malignant neoplasm of skin: Secondary | ICD-10-CM | POA: Diagnosis not present

## 2018-11-14 DIAGNOSIS — D2230 Melanocytic nevi of unspecified part of face: Secondary | ICD-10-CM | POA: Diagnosis not present

## 2018-11-14 DIAGNOSIS — L578 Other skin changes due to chronic exposure to nonionizing radiation: Secondary | ICD-10-CM | POA: Diagnosis not present

## 2018-12-12 DIAGNOSIS — G245 Blepharospasm: Secondary | ICD-10-CM | POA: Diagnosis not present

## 2018-12-23 DIAGNOSIS — G245 Blepharospasm: Secondary | ICD-10-CM | POA: Diagnosis not present

## 2019-01-12 DIAGNOSIS — Z79899 Other long term (current) drug therapy: Secondary | ICD-10-CM | POA: Diagnosis not present

## 2019-01-12 DIAGNOSIS — Z23 Encounter for immunization: Secondary | ICD-10-CM | POA: Diagnosis not present

## 2019-01-12 DIAGNOSIS — J431 Panlobular emphysema: Secondary | ICD-10-CM | POA: Diagnosis not present

## 2019-01-12 DIAGNOSIS — E7800 Pure hypercholesterolemia, unspecified: Secondary | ICD-10-CM | POA: Diagnosis not present

## 2019-01-12 DIAGNOSIS — E1142 Type 2 diabetes mellitus with diabetic polyneuropathy: Secondary | ICD-10-CM | POA: Diagnosis not present

## 2019-01-12 DIAGNOSIS — Z0100 Encounter for examination of eyes and vision without abnormal findings: Secondary | ICD-10-CM | POA: Diagnosis not present

## 2019-01-12 DIAGNOSIS — E119 Type 2 diabetes mellitus without complications: Secondary | ICD-10-CM | POA: Diagnosis not present

## 2019-01-12 DIAGNOSIS — I10 Essential (primary) hypertension: Secondary | ICD-10-CM | POA: Diagnosis not present

## 2019-02-15 DIAGNOSIS — M5489 Other dorsalgia: Secondary | ICD-10-CM | POA: Diagnosis not present

## 2019-02-15 DIAGNOSIS — M47816 Spondylosis without myelopathy or radiculopathy, lumbar region: Secondary | ICD-10-CM | POA: Diagnosis not present

## 2019-02-15 DIAGNOSIS — M546 Pain in thoracic spine: Secondary | ICD-10-CM | POA: Diagnosis not present

## 2019-02-17 ENCOUNTER — Other Ambulatory Visit: Payer: Self-pay | Admitting: Orthopedic Surgery

## 2019-02-17 DIAGNOSIS — S22000A Wedge compression fracture of unspecified thoracic vertebra, initial encounter for closed fracture: Secondary | ICD-10-CM

## 2019-02-17 DIAGNOSIS — W11XXXA Fall on and from ladder, initial encounter: Secondary | ICD-10-CM | POA: Diagnosis not present

## 2019-02-20 ENCOUNTER — Ambulatory Visit
Admission: RE | Admit: 2019-02-20 | Discharge: 2019-02-20 | Disposition: A | Discharge status: Home / Self Care | Payer: PPO | Source: Ambulatory Visit | Attending: Orthopedic Surgery | Admitting: Orthopedic Surgery

## 2019-02-20 ENCOUNTER — Other Ambulatory Visit: Payer: Self-pay

## 2019-02-20 DIAGNOSIS — S22000A Wedge compression fracture of unspecified thoracic vertebra, initial encounter for closed fracture: Secondary | ICD-10-CM | POA: Diagnosis not present

## 2019-02-20 DIAGNOSIS — M546 Pain in thoracic spine: Secondary | ICD-10-CM | POA: Diagnosis not present

## 2019-02-21 ENCOUNTER — Other Ambulatory Visit: Payer: Self-pay | Admitting: Orthopedic Surgery

## 2019-02-21 ENCOUNTER — Encounter
Admission: RE | Admit: 2019-02-21 | Discharge: 2019-02-21 | Disposition: A | Discharge status: Home / Self Care | Payer: PPO | Source: Ambulatory Visit | Attending: Orthopedic Surgery | Admitting: Orthopedic Surgery

## 2019-02-21 ENCOUNTER — Other Ambulatory Visit: Admission: RE | Admit: 2019-02-21 | Payer: PPO | Source: Ambulatory Visit

## 2019-02-21 DIAGNOSIS — R9431 Abnormal electrocardiogram [ECG] [EKG]: Secondary | ICD-10-CM | POA: Insufficient documentation

## 2019-02-21 DIAGNOSIS — Z01818 Encounter for other preprocedural examination: Secondary | ICD-10-CM | POA: Diagnosis not present

## 2019-02-21 DIAGNOSIS — I10 Essential (primary) hypertension: Secondary | ICD-10-CM | POA: Insufficient documentation

## 2019-02-21 DIAGNOSIS — Z20828 Contact with and (suspected) exposure to other viral communicable diseases: Secondary | ICD-10-CM | POA: Diagnosis not present

## 2019-02-21 DIAGNOSIS — I4510 Unspecified right bundle-branch block: Secondary | ICD-10-CM | POA: Insufficient documentation

## 2019-02-21 DIAGNOSIS — Z01810 Encounter for preprocedural cardiovascular examination: Secondary | ICD-10-CM | POA: Diagnosis not present

## 2019-02-21 LAB — POTASSIUM: Potassium: 3.9 mmol/L (ref 3.5–5.1)

## 2019-02-21 LAB — SARS CORONAVIRUS 2 (TAT 6-24 HRS): SARS Coronavirus 2: NEGATIVE

## 2019-02-21 NOTE — Pre-Procedure Instructions (Signed)
Dr. Oren Section notified to review EKG.  New Atrial Flutter on EKG today.

## 2019-02-21 NOTE — Patient Instructions (Signed)
Your procedure is scheduled [id number] 11/5 Report to Day Surgery. To find out your arrival time please call [phone] between 1PM - 3PM on Wed. 11/4.  Remember: Instructions that are not followed completely may result in serious medical risk,  up to and including death, or upon the discretion of your surgeon and anesthesiologist your  surgery may need to be rescheduled.     _X__ 1. Do not eat food after midnight the night before your procedure.                 No gum chewing or hard candies. You may drink clear liquids up to 2 hours                 before you are scheduled to arrive for your surgery- DO not drink clear                 liquids within 2 hours of the start of your surgery.                 Clear Liquids include:  water, Black Coffee or Tea (Do not add                 anything to coffee or tea).  __X__2.  On the morning of surgery brush your teeth with toothpaste and water, you                may rinse your mouth with mouthwash if you wish.  Do not swallow any toothpaste of mouthwash.     _X__ 3.  No Alcohol for 24 hours before or after surgery.   _X__ 4.  Do Not Smoke or use e-cigarettes For 24 Hours Prior to Your Surgery.                 Do not use any chewable tobacco products for at least 6 hours prior to                 surgery.  ____  5.  Bring all medications with you on the day of surgery if instructed.   __x__  6.  Notify your doctor if there is any change in your medical condition      (cold, fever, infections).     Do not wear jewelry, make-up, hairpins, clips or nail polish. Do not wear lotions, powders, or perfumes. You may wear deodorant. Do not shave 48 hours prior to surgery. Men may shave face and neck. Do not bring valuables to the hospital.    [facility] is not responsible for any belongings or valuables.  Contacts, dentures or bridgework may not be worn into surgery. Leave your suitcase in the car. After surgery it may  be brought to your room. For patients admitted to the hospital, discharge time is determined by your treatment team.   Patients discharged the day of surgery will not be allowed to drive home.   Please read over the following fact sheets that you were given:     _x___ Take these medicines the morning of surgery with A SIP OF WATER:    1. HYDROcodone-acetaminophen (NORCO/VICODIN) 5-325 MG tablet  If needed  2.   3.   4.  5.  6.  ____ Fleet Enema (as directed)   __x__ Use CHG Soap as directed  ____ Use inhalers on the day of surgery  __x__ Stop metformin 2 days prior to surgery  today    ____ Take 1/2 of usual insulin  dose the night before surgery. No insulin the morning          of surgery.   _x___ Stop aspirin todday  __x__ Stop Anti-inflammatories ibuprofen (ADVIL) 200 MG tablet, or aleve.  May take plain tylenol or your pain medication   __x__ Stop supplements until after surgery.  Misc Natural Products (URINOZINC PLUS PO  ____ Bring C-Pap to the hospital.

## 2019-02-21 NOTE — Pre-Procedure Instructions (Signed)
Dr. Kayleen Memos said to send EKG to Dr. Doy Hutching but it is OK to proceed.

## 2019-02-23 ENCOUNTER — Ambulatory Visit: Payer: PPO

## 2019-02-23 ENCOUNTER — Ambulatory Visit: Payer: PPO | Admitting: Anesthesiology

## 2019-02-23 ENCOUNTER — Encounter
Admission: RE | Disposition: A | Discharge status: Home / Self Care | Payer: Self-pay | Source: Home / Self Care | Attending: Orthopedic Surgery

## 2019-02-23 ENCOUNTER — Other Ambulatory Visit: Payer: Self-pay

## 2019-02-23 ENCOUNTER — Ambulatory Visit
Admission: RE | Admit: 2019-02-23 | Discharge: 2019-02-23 | Disposition: A | Discharge status: Home / Self Care | Payer: PPO | Attending: Orthopedic Surgery | Admitting: Orthopedic Surgery

## 2019-02-23 DIAGNOSIS — Z981 Arthrodesis status: Secondary | ICD-10-CM | POA: Diagnosis not present

## 2019-02-23 DIAGNOSIS — Z419 Encounter for procedure for purposes other than remedying health state, unspecified: Secondary | ICD-10-CM

## 2019-02-23 DIAGNOSIS — I10 Essential (primary) hypertension: Secondary | ICD-10-CM | POA: Diagnosis not present

## 2019-02-23 DIAGNOSIS — Z79899 Other long term (current) drug therapy: Secondary | ICD-10-CM | POA: Diagnosis not present

## 2019-02-23 DIAGNOSIS — E785 Hyperlipidemia, unspecified: Secondary | ICD-10-CM | POA: Insufficient documentation

## 2019-02-23 DIAGNOSIS — J449 Chronic obstructive pulmonary disease, unspecified: Secondary | ICD-10-CM | POA: Diagnosis not present

## 2019-02-23 DIAGNOSIS — E119 Type 2 diabetes mellitus without complications: Secondary | ICD-10-CM | POA: Diagnosis not present

## 2019-02-23 DIAGNOSIS — Z8551 Personal history of malignant neoplasm of bladder: Secondary | ICD-10-CM | POA: Insufficient documentation

## 2019-02-23 DIAGNOSIS — F17200 Nicotine dependence, unspecified, uncomplicated: Secondary | ICD-10-CM | POA: Insufficient documentation

## 2019-02-23 DIAGNOSIS — S22080A Wedge compression fracture of T11-T12 vertebra, initial encounter for closed fracture: Secondary | ICD-10-CM | POA: Diagnosis not present

## 2019-02-23 DIAGNOSIS — N400 Enlarged prostate without lower urinary tract symptoms: Secondary | ICD-10-CM | POA: Diagnosis not present

## 2019-02-23 DIAGNOSIS — Z7984 Long term (current) use of oral hypoglycemic drugs: Secondary | ICD-10-CM | POA: Diagnosis not present

## 2019-02-23 DIAGNOSIS — W11XXXA Fall on and from ladder, initial encounter: Secondary | ICD-10-CM | POA: Diagnosis not present

## 2019-02-23 DIAGNOSIS — Z7982 Long term (current) use of aspirin: Secondary | ICD-10-CM | POA: Insufficient documentation

## 2019-02-23 HISTORY — PX: KYPHOPLASTY: SHX5884

## 2019-02-23 LAB — GLUCOSE, CAPILLARY
Glucose-Capillary: 67 mg/dL — ABNORMAL LOW (ref 70–99)
Glucose-Capillary: 132 mg/dL — ABNORMAL HIGH (ref 70–99)
Glucose-Capillary: 125 mg/dL — ABNORMAL HIGH (ref 70–99)

## 2019-02-23 SURGERY — KYPHOPLASTY
Anesthesia: Monitor Anesthesia Care

## 2019-02-23 MED ORDER — FENTANYL CITRATE (PF) 100 MCG/2ML IJ SOLN
INTRAMUSCULAR | Status: AC
  Filled 2019-02-23: qty 2

## 2019-02-23 MED ORDER — FENTANYL CITRATE (PF) 100 MCG/2ML IJ SOLN: 25.0000 ug | INTRAMUSCULAR | Status: DC | PRN

## 2019-02-23 MED ORDER — BUPIVACAINE HCL (PF) 0.5 % IJ SOLN
INTRAMUSCULAR | Status: AC
  Filled 2019-02-23: qty 30

## 2019-02-23 MED ORDER — FAMOTIDINE 20 MG PO TABS
ORAL_TABLET | ORAL | Status: AC
  Administered 2019-02-23: 20 mg via ORAL
  Filled 2019-02-23: qty 1

## 2019-02-23 MED ORDER — LIDOCAINE HCL 1 % IJ SOLN
INTRAMUSCULAR | Status: DC | PRN
  Administered 2019-02-23: 20 mL

## 2019-02-23 MED ORDER — CEFAZOLIN SODIUM-DEXTROSE 2-4 GM/100ML-% IV SOLN: 2.0000 g | Freq: Once | INTRAVENOUS | Status: DC

## 2019-02-23 MED ORDER — ONDANSETRON HCL 4 MG/2ML IJ SOLN: 4.0000 mg | Freq: Once | INTRAMUSCULAR | Status: DC | PRN

## 2019-02-23 MED ORDER — MIDAZOLAM HCL 2 MG/2ML IJ SOLN
INTRAMUSCULAR | Status: DC | PRN
  Administered 2019-02-23 (×2): 1 mg via INTRAVENOUS

## 2019-02-23 MED ORDER — PROPOFOL 10 MG/ML IV BOLUS
INTRAVENOUS | Status: AC
  Filled 2019-02-23: qty 20

## 2019-02-23 MED ORDER — DEXTROSE 50 % IV SOLN
0.5000 | Freq: Once | INTRAVENOUS | Status: AC
  Administered 2019-02-23: 11:00:00 25 mL via INTRAVENOUS

## 2019-02-23 MED ORDER — HYDROCODONE-ACETAMINOPHEN 5-325 MG PO TABS: 1.0000 | ORAL_TABLET | Freq: Four times a day (QID) | ORAL | 0 refills | Status: DC | PRN

## 2019-02-23 MED ORDER — IOHEXOL 180 MG/ML  SOLN
INTRAMUSCULAR | Status: DC | PRN
  Administered 2019-02-23: 30 mL

## 2019-02-23 MED ORDER — BUPIVACAINE-EPINEPHRINE (PF) 0.5% -1:200000 IJ SOLN
INTRAMUSCULAR | Status: DC | PRN
  Administered 2019-02-23: 30 mL

## 2019-02-23 MED ORDER — LIDOCAINE HCL (PF) 1 % IJ SOLN
INTRAMUSCULAR | Status: AC
  Filled 2019-02-23: qty 30

## 2019-02-23 MED ORDER — SODIUM CHLORIDE 0.9 % IV SOLN
INTRAVENOUS | Status: DC
  Administered 2019-02-23 (×2): via INTRAVENOUS

## 2019-02-23 MED ORDER — CEFAZOLIN SODIUM-DEXTROSE 2-4 GM/100ML-% IV SOLN
INTRAVENOUS | Status: AC
  Filled 2019-02-23: qty 100

## 2019-02-23 MED ORDER — EPINEPHRINE PF 1 MG/ML IJ SOLN
INTRAMUSCULAR | Status: AC
  Filled 2019-02-23: qty 1

## 2019-02-23 MED ORDER — FAMOTIDINE 20 MG PO TABS
20.0000 mg | ORAL_TABLET | Freq: Once | ORAL | Status: AC
  Administered 2019-02-23: 11:00:00 20 mg via ORAL

## 2019-02-23 MED ORDER — MIDAZOLAM HCL 2 MG/2ML IJ SOLN
INTRAMUSCULAR | Status: AC
  Filled 2019-02-23: qty 2

## 2019-02-23 MED ORDER — PROPOFOL 500 MG/50ML IV EMUL
INTRAVENOUS | Status: DC | PRN
  Administered 2019-02-23: 30 ug/kg/min via INTRAVENOUS

## 2019-02-23 MED ORDER — FENTANYL CITRATE (PF) 100 MCG/2ML IJ SOLN
INTRAMUSCULAR | Status: DC | PRN
  Administered 2019-02-23 (×2): 50 ug via INTRAVENOUS

## 2019-02-23 MED ORDER — DEXTROSE 50 % IV SOLN
INTRAVENOUS | Status: AC
  Administered 2019-02-23: 25 mL via INTRAVENOUS
  Filled 2019-02-23: qty 50

## 2019-02-23 SURGICAL SUPPLY — 20 items
CEMENT KYPHON CX01A KIT/MIXER (Cement) ×3 IMPLANT
COVER WAND RF STERILE (DRAPES) ×3 IMPLANT
DERMABOND ADVANCED (GAUZE/BANDAGES/DRESSINGS) ×2
DERMABOND ADVANCED .7 DNX12 (GAUZE/BANDAGES/DRESSINGS) ×1 IMPLANT
DEVICE BIOPSY BONE KYPHX (INSTRUMENTS) ×3 IMPLANT
DRAPE C-ARM XRAY 36X54 (DRAPES) ×3 IMPLANT
DURAPREP 26ML APPLICATOR (WOUND CARE) ×3 IMPLANT
FEE RENTAL RFA GENERATOR (MISCELLANEOUS) IMPLANT
GLOVE SURG SYN 9.0  PF PI (GLOVE) ×2
GLOVE SURG SYN 9.0 PF PI (GLOVE) ×1 IMPLANT
GOWN SRG 2XL LVL 4 RGLN SLV (GOWNS) ×1 IMPLANT
GOWN STRL NON-REIN 2XL LVL4 (GOWNS) ×2
GOWN STRL REUS W/ TWL LRG LVL3 (GOWN DISPOSABLE) ×1 IMPLANT
GOWN STRL REUS W/TWL LRG LVL3 (GOWN DISPOSABLE) ×2
PACK KYPHOPLASTY (MISCELLANEOUS) ×3 IMPLANT
RENTAL RFA  GENERATOR (MISCELLANEOUS)
RENTAL RFA GENERATOR (MISCELLANEOUS) IMPLANT
STRAP SAFETY 5IN WIDE (MISCELLANEOUS) ×3 IMPLANT
TRAY KYPHOPAK 15/3 EXPRESS 1ST (MISCELLANEOUS) ×1 IMPLANT
TRAY KYPHOPAK 20/3 EXPRESS 1ST (MISCELLANEOUS) ×3 IMPLANT

## 2019-02-23 NOTE — Transfer of Care (Signed)
Immediate Anesthesia Transfer of Care Note  Patient: Jonathon Barrera  Procedure(s) Performed: T12 KYPHOPLASTY (N/A )  Patient Location: PACU  Anesthesia Type:General  Level of Consciousness: awake, alert  and oriented  Airway & Oxygen Therapy: Patient Spontanous Breathing and Patient connected to nasal cannula oxygen  Post-op Assessment: Report given to RN and Post -op Vital signs reviewed and stable  Post vital signs: Reviewed and stable  Last Vitals:  Vitals Value Taken Time  BP    Temp    Pulse    Resp    SpO2      Last Pain:  Vitals:   02/23/19 1108  TempSrc: Oral  PainSc: 0-No pain         Complications: No apparent anesthesia complications

## 2019-02-23 NOTE — Anesthesia Preprocedure Evaluation (Addendum)
Anesthesia Evaluation  Patient identified by MRN, date of birth, ID band Patient awake    Reviewed: Allergy & Precautions, NPO status , Patient's Chart, lab work & pertinent test results  Airway Mallampati: III   Neck ROM: Limited  Mouth opening: Limited Mouth Opening  Dental  (+) Lower Dentures, Upper Dentures   Pulmonary COPD,  COPD inhaler, Current Smoker,    Pulmonary exam normal        Cardiovascular hypertension, Pt. on medications Normal cardiovascular exam     Neuro/Psych negative neurological ROS  negative psych ROS   GI/Hepatic negative GI ROS, Neg liver ROS,   Endo/Other  diabetes, Well Controlled, Type 2  Renal/GU negative Renal ROS Bladder dysfunction  Bladder CA Hx    Musculoskeletal negative musculoskeletal ROS (+)   Abdominal Normal abdominal exam  (+)   Peds negative pediatric ROS (+)  Hematology negative hematology ROS (+)   Anesthesia Other Findings Past Medical History: No date: Bladder carcinoma (HCC) No date: BPH (benign prostatic hyperplasia) No date: Cancer ([place])     Comment:  MELANOMA No date: Colon polyps No date: COPD (chronic obstructive pulmonary disease) (HCC) No date: Diabetes mellitus without complication (HCC) No date: HOH (hard of hearing)     Comment:  AIDS No date: Hyperlipemia No date: Hypertension No date: Mass     Comment:  Lt lower cheek No date: Skin sore  Reproductive/Obstetrics                             Anesthesia Physical  Anesthesia Plan  ASA: III  Anesthesia Plan: MAC   Post-op Pain Management:    Induction: Intravenous  PONV Risk Score and Plan:   Airway Management Planned: Nasal Cannula  Additional Equipment:   Intra-op Plan:   Post-operative Plan:   Informed Consent: I have reviewed the patients History and Physical, chart, labs and discussed the procedure including the risks, benefits and alternatives for the  proposed anesthesia with the patient or authorized representative who has indicated his/her understanding and acceptance.     Dental advisory given  Plan Discussed with: CRNA and Surgeon  Anesthesia Plan Comments:         Anesthesia Quick Evaluation

## 2019-02-23 NOTE — Op Note (Signed)
Date 02/23/2019  time 1:06 PM   PATIENT:  Jonathon Barrera   PRE-OPERATIVE DIAGNOSIS:  closed wedge compression fracture of T12   POST-OPERATIVE DIAGNOSIS:  closed wedge compression fracture of T12   PROCEDURE:  Procedure(s): KYPHOPLASTY T12  SURGEON: Laurene Footman, MD   ASSISTANTS: None   ANESTHESIA:   local and MAC   EBL:  No intake/output data recorded.   BLOOD ADMINISTERED:none   DRAINS: none    LOCAL MEDICATIONS USED:  MARCAINE    and XYLOCAINE    SPECIMEN:   T12 vertebral body biopsy   DISPOSITION OF SPECIMEN:  Pathology   COUNTS:  YES   TOURNIQUET:  * No tourniquets in log *   IMPLANTS: Bone cement   DICTATION: .Dragon Dictation  patient was brought to the operating room and after adequate anesthesia was obtained the patient was placed prone.  C arm was brought in in good visualization of the affected level obtained on both AP and lateral projections.  After patient identification and timeout procedures were completed, local anesthetic was infiltrated with 10 cc 1% Xylocaine infiltrated subcutaneously.  This is done the area on the each side of the planned approach.  The back was then prepped and draped in the usual sterile manner and repeat timeout procedure carried out.  A spinal needle was brought down to the pedicle on the each side of  T12 and a 50-50 mix of 1% Xylocaine half percent Sensorcaine with epinephrine total of 20 cc injected.  After allowing this to set a small incision was made and the trocar was advanced into the vertebral body in an extrapedicular fashion.  Biopsy was obtained Drilling was carried out balloon inserted with inflation to  3 cc to each side.  When the cement was appropriate consistency 7-1/2 cc were injected into the vertebral body without extravasation, good fill superior to inferior endplates and from right to left sides along the inferior endplate.  After the cement had set the trochar was removed and permanent C-arm views obtained.  The  wound was closed with Dermabond followed by Band-Aid   PLAN OF CARE: Discharge to home after PACU   PATIENT DISPOSITION:  PACU - hemodynamically stable.

## 2019-02-23 NOTE — Anesthesia Procedure Notes (Signed)
Date/Time: 02/23/2019 12:35 PM Performed by: Nelda Marseille, CRNA Pre-anesthesia Checklist: Patient identified, Emergency Drugs available, Suction available, Patient being monitored and Timeout performed Oxygen Delivery Method: Nasal cannula

## 2019-02-23 NOTE — Progress Notes (Signed)
Checked pt blood sugar upon arrival CBG 67. Dr Kayleen Memos notified. 0.5 ampule of D50 given at 1124 blood sugar checked approximately 30 minutes later CBG 132.

## 2019-02-23 NOTE — H&P (Signed)
Reviewed paper H+P, will be scanned into chart. No changes noted. T 12 only fracture on MRI after office visit. Here for T 12 kyphoplasty

## 2019-02-23 NOTE — Discharge Instructions (Addendum)
AMBULATORY SURGERY  DISCHARGE INSTRUCTIONS   1) The drugs that you were given will stay in your system until tomorrow so for the next 24 hours you should not:  A) Drive an automobile B) Make any legal decisions C) Drink any alcoholic beverage   2) You may resume regular meals tomorrow.  Today it is better to start with liquids and gradually work up to solid foods.  You may eat anything you prefer, but it is better to start with liquids, then soup and crackers, and gradually work up to solid foods.   3) Please notify your doctor immediately if you have any unusual bleeding, trouble breathing, redness and pain at the surgery site, drainage, fever, or pain not relieved by medication. 4)   5) Your post-operative visit with Dr.                                     is: Date:                        Time:    Please call to schedule your post-operative visit.  6) Additional Instructions:     Take it easy today and tomorrow.  Resume more normal activity on Saturday as tolerated.  Remove Band-Aids on Saturday then okay to shower.  Pain medicine as directed.  Call office if you are having problems.

## 2019-02-23 NOTE — Anesthesia Postprocedure Evaluation (Signed)
Anesthesia Post Note  Patient: Jonathon Barrera  Procedure(s) Performed: T12 KYPHOPLASTY (N/A )  Patient location during evaluation: PACU Anesthesia Type: MAC Level of consciousness: awake and alert and oriented Pain management: pain level controlled Vital Signs Assessment: post-procedure vital signs reviewed and stable Respiratory status: spontaneous breathing Cardiovascular status: blood pressure returned to baseline Anesthetic complications: no     Last Vitals:  Vitals:   02/23/19 1335 02/23/19 1344  BP: (!) 151/83 (!) 155/86  Pulse: 66 63  Resp: 17 16  Temp: 36.5 C   SpO2: 96% 94%    Last Pain:  Vitals:   02/23/19 1344  TempSrc:   PainSc: 0-No pain                 [name],[name]

## 2019-02-23 NOTE — Anesthesia Post-op Follow-up Note (Signed)
Anesthesia QCDR form completed.        

## 2019-02-23 NOTE — Consult Note (Signed)
Pharmacy Antibiotic Note  AMAAD ELIASSEN is a 78 y.o. male admitted on 02/23/2019 with surgical prophylaxis.  Pharmacy has been consulted for Cefazolin dosing.  Plan: Cefazolin 2g x1 dose, to be given 30 minutes prior to procedure.      Temp (24hrs), Avg:97.6 F (36.4 C), Min:97.6 F (36.4 C), Max:97.6 F (36.4 C)  No results for input(s): WBC, CREATININE, LATICACIDVEN, VANCOTROUGH, VANCOPEAK, VANCORANDOM, GENTTROUGH, GENTPEAK, GENTRANDOM, TOBRATROUGH, TOBRAPEAK, TOBRARND, AMIKACINPEAK, AMIKACINTROU, AMIKACIN in the last 168 hours.  CrCl cannot be calculated (No successful lab value found.).    No Known Allergies   Thank you for allowing pharmacy to be a part of this patient's care.  Rowland Lathe 02/23/2019 11:25 AM

## 2019-02-24 ENCOUNTER — Encounter: Payer: Self-pay | Admitting: Orthopedic Surgery

## 2019-02-24 LAB — SURGICAL PATHOLOGY

## 2019-03-14 DIAGNOSIS — S22000D Wedge compression fracture of unspecified thoracic vertebra, subsequent encounter for fracture with routine healing: Secondary | ICD-10-CM | POA: Diagnosis not present

## 2019-03-20 DIAGNOSIS — G245 Blepharospasm: Secondary | ICD-10-CM | POA: Diagnosis not present

## 2019-05-09 DIAGNOSIS — R0981 Nasal congestion: Secondary | ICD-10-CM | POA: Diagnosis not present

## 2019-05-09 DIAGNOSIS — R519 Headache, unspecified: Secondary | ICD-10-CM | POA: Diagnosis not present

## 2019-05-09 DIAGNOSIS — Z20828 Contact with and (suspected) exposure to other viral communicable diseases: Secondary | ICD-10-CM | POA: Diagnosis not present

## 2019-06-12 DIAGNOSIS — G245 Blepharospasm: Secondary | ICD-10-CM | POA: Diagnosis not present

## 2019-06-16 DIAGNOSIS — H903 Sensorineural hearing loss, bilateral: Secondary | ICD-10-CM | POA: Diagnosis not present

## 2019-06-16 DIAGNOSIS — H6123 Impacted cerumen, bilateral: Secondary | ICD-10-CM | POA: Diagnosis not present

## 2019-08-14 DIAGNOSIS — E7800 Pure hypercholesterolemia, unspecified: Secondary | ICD-10-CM | POA: Diagnosis not present

## 2019-08-14 DIAGNOSIS — Z79899 Other long term (current) drug therapy: Secondary | ICD-10-CM | POA: Diagnosis not present

## 2019-08-14 DIAGNOSIS — E1142 Type 2 diabetes mellitus with diabetic polyneuropathy: Secondary | ICD-10-CM | POA: Diagnosis not present

## 2019-08-14 DIAGNOSIS — Z0000 Encounter for general adult medical examination without abnormal findings: Secondary | ICD-10-CM | POA: Diagnosis not present

## 2019-08-14 DIAGNOSIS — Z125 Encounter for screening for malignant neoplasm of prostate: Secondary | ICD-10-CM | POA: Diagnosis not present

## 2019-08-14 DIAGNOSIS — J431 Panlobular emphysema: Secondary | ICD-10-CM | POA: Diagnosis not present

## 2019-08-14 DIAGNOSIS — J449 Chronic obstructive pulmonary disease, unspecified: Secondary | ICD-10-CM | POA: Diagnosis not present

## 2019-08-14 DIAGNOSIS — I10 Essential (primary) hypertension: Secondary | ICD-10-CM | POA: Diagnosis not present

## 2019-08-14 DIAGNOSIS — Z1211 Encounter for screening for malignant neoplasm of colon: Secondary | ICD-10-CM | POA: Diagnosis not present

## 2019-08-17 DIAGNOSIS — Z79899 Other long term (current) drug therapy: Secondary | ICD-10-CM | POA: Diagnosis not present

## 2019-08-17 DIAGNOSIS — J431 Panlobular emphysema: Secondary | ICD-10-CM | POA: Diagnosis not present

## 2019-08-17 DIAGNOSIS — Z1211 Encounter for screening for malignant neoplasm of colon: Secondary | ICD-10-CM | POA: Diagnosis not present

## 2019-08-17 DIAGNOSIS — E1142 Type 2 diabetes mellitus with diabetic polyneuropathy: Secondary | ICD-10-CM | POA: Diagnosis not present

## 2019-08-17 DIAGNOSIS — Z125 Encounter for screening for malignant neoplasm of prostate: Secondary | ICD-10-CM | POA: Diagnosis not present

## 2019-08-17 DIAGNOSIS — E7800 Pure hypercholesterolemia, unspecified: Secondary | ICD-10-CM | POA: Diagnosis not present

## 2019-08-17 DIAGNOSIS — I10 Essential (primary) hypertension: Secondary | ICD-10-CM | POA: Diagnosis not present

## 2019-08-17 DIAGNOSIS — Z0000 Encounter for general adult medical examination without abnormal findings: Secondary | ICD-10-CM | POA: Diagnosis not present

## 2019-09-15 DIAGNOSIS — E119 Type 2 diabetes mellitus without complications: Secondary | ICD-10-CM | POA: Diagnosis not present

## 2019-09-15 DIAGNOSIS — G245 Blepharospasm: Secondary | ICD-10-CM | POA: Diagnosis not present

## 2019-11-16 ENCOUNTER — Other Ambulatory Visit: Payer: Self-pay

## 2019-11-16 ENCOUNTER — Ambulatory Visit: Payer: PPO | Admitting: Dermatology

## 2019-11-16 DIAGNOSIS — L905 Scar conditions and fibrosis of skin: Secondary | ICD-10-CM | POA: Diagnosis not present

## 2019-11-16 DIAGNOSIS — L821 Other seborrheic keratosis: Secondary | ICD-10-CM

## 2019-11-16 DIAGNOSIS — Z85828 Personal history of other malignant neoplasm of skin: Secondary | ICD-10-CM

## 2019-11-16 DIAGNOSIS — D1800 Hemangioma unspecified site: Secondary | ICD-10-CM

## 2019-11-16 DIAGNOSIS — Z85820 Personal history of malignant melanoma of skin: Secondary | ICD-10-CM

## 2019-11-16 DIAGNOSIS — B079 Viral wart, unspecified: Secondary | ICD-10-CM | POA: Diagnosis not present

## 2019-11-16 DIAGNOSIS — L814 Other melanin hyperpigmentation: Secondary | ICD-10-CM | POA: Diagnosis not present

## 2019-11-16 DIAGNOSIS — L578 Other skin changes due to chronic exposure to nonionizing radiation: Secondary | ICD-10-CM | POA: Diagnosis not present

## 2019-11-16 DIAGNOSIS — L570 Actinic keratosis: Secondary | ICD-10-CM | POA: Diagnosis not present

## 2019-11-16 DIAGNOSIS — L080 Pyoderma: Secondary | ICD-10-CM | POA: Diagnosis not present

## 2019-11-16 DIAGNOSIS — D229 Melanocytic nevi, unspecified: Secondary | ICD-10-CM | POA: Diagnosis not present

## 2019-11-16 DIAGNOSIS — D485 Neoplasm of uncertain behavior of skin: Secondary | ICD-10-CM

## 2019-11-16 DIAGNOSIS — Z1283 Encounter for screening for malignant neoplasm of skin: Secondary | ICD-10-CM | POA: Diagnosis not present

## 2019-11-16 NOTE — Progress Notes (Signed)
Follow-Up Visit   Subjective  Jonathon Barrera is a 79 y.o. male who presents for the following: Annual Exam (Hx MM, SCC, and AK's - patient has noticed a lesion on his R index finger that is sore at times and he files it down). The patient presents for Total-Body Skin Exam (TBSE) for skin cancer screening and mole check.  The following portions of the chart were reviewed this encounter and updated as appropriate:  Tobacco  Allergies  Meds  Problems  Med Hx  Surg Hx  Fam Hx     Review of Systems:  No other skin or systemic complaints except as noted in HPI or Assessment and Plan.  Objective  Well appearing patient in no apparent distress; mood and affect are within normal limits.  A full examination was performed including scalp, head, eyes, ears, nose, lips, neck, chest, axillae, abdomen, back, buttocks, bilateral upper extremities, bilateral lower extremities, hands, feet, fingers, toes, fingernails, and toenails. All findings within normal limits unless otherwise noted below.  Objective  R index finger (4): Verrucous papules -- Discussed viral etiology and contagion.   Objective  ears and nasal bridge (11): Erythematous thin papules/macules with gritty scale.   Objective  L underside nasal tip: Crusted papule 0.8 cm      Objective  Mid upper back spinal: Dyspigmented smooth macule or patch.   Assessment & Plan  Viral warts, unspecified type (4) R index finger  Destruction of lesion - R index finger Complexity: simple   Destruction method: cryotherapy   Informed consent: discussed and consent obtained   Timeout:  patient name, date of birth, surgical site, and procedure verified Lesion destroyed using liquid nitrogen: Yes   Region frozen until ice ball extended beyond lesion: Yes   Outcome: patient tolerated procedure well with no complications   Post-procedure details: wound care instructions given    AK (actinic keratosis) (11) ears and nasal  bridge  Destruction of lesion - ears and nasal bridge Complexity: simple   Destruction method: cryotherapy   Informed consent: discussed and consent obtained   Timeout:  patient name, date of birth, surgical site, and procedure verified Lesion destroyed using liquid nitrogen: Yes   Region frozen until ice ball extended beyond lesion: Yes   Outcome: patient tolerated procedure well with no complications   Post-procedure details: wound care instructions given    Neoplasm of uncertain behavior of skin L underside nasal tip  Skin / nail biopsy Type of biopsy: tangential   Informed consent: discussed and consent obtained   Timeout: patient name, date of birth, surgical site, and procedure verified   Procedure prep:  Patient was prepped and draped in usual sterile fashion Prep type:  Isopropyl alcohol Anesthesia: the lesion was anesthetized in a standard fashion   Anesthetic:  1% lidocaine w/ epinephrine 1-100,000 buffered w/ 8.4% NaHCO3 Instrument used: flexible razor blade   Hemostasis achieved with: pressure, aluminum chloride and electrodesiccation   Outcome: patient tolerated procedure well   Post-procedure details: sterile dressing applied and wound care instructions given   Dressing type: bandage and petrolatum    Specimen 1 - Surgical pathology Differential Diagnosis: D48.5 r/o SCC vs BCC vs other Check Margins: No Crusted papule 0.8 cm  Scar conditions and fibrosis of skin Mid upper back spinal  MM per patient history - Clear. Observe for recurrence. Call clinic for new or changing lesions.  Recommend regular skin exams, daily broad-spectrum spf 30+ sunscreen use, and photoprotection.  No LAD.  Skin cancer screening   Lentigines - Scattered tan macules - Discussed due to sun exposure - Benign, observe - Call for any changes  Seborrheic Keratoses - Stuck-on, waxy, tan-brown papules and plaques  - Discussed benign etiology and prognosis. - Observe - Call for any  changes  Melanocytic Nevi - Tan-brown and/or pink-flesh-colored symmetric macules and papules - Benign appearing on exam today - Observation - Call clinic for new or changing moles - Recommend daily use of broad spectrum spf 30+ sunscreen to sun-exposed areas.   Hemangiomas - Red papules - Discussed benign nature - Observe - Call for any changes  Actinic Damage - diffuse scaly erythematous macules with underlying dyspigmentation - Recommend daily broad spectrum sunscreen SPF 30+ to sun-exposed areas, reapply every 2 hours as needed.  - Call for new or changing lesions.  History of Squamous Cell Carcinoma of the Skin - No evidence of recurrence today - No lymphadenopathy - Recommend regular full body skin exams - Recommend daily broad spectrum sunscreen SPF 30+ to sun-exposed areas, reapply every 2 hours as needed.  - Call if any new or changing lesions are noted between office visits  Skin cancer screening performed today.  Return in about 1 year (around 11/15/2020) for TBSE.  Luther Redo, CMA, am acting as scribe for Sarina Ser, MD .  Documentation: I have reviewed the above documentation for accuracy and completeness, and I agree with the above.  Sarina Ser, MD

## 2019-11-16 NOTE — Patient Instructions (Signed)

## 2019-11-20 ENCOUNTER — Encounter: Payer: Self-pay | Admitting: Dermatology

## 2019-11-27 ENCOUNTER — Telehealth: Payer: Self-pay

## 2019-11-27 NOTE — Telephone Encounter (Signed)
Patient gave verbal permission to discuss pathology results with his wife. Patient's wife informed of results.

## 2019-11-27 NOTE — Telephone Encounter (Signed)
-----   Message from Ralene Bathe, MD sent at 11/23/2019  6:17 PM EDT ----- Skin , left underside nasal tip SUPPURATIVE GRANULOMATOUS DERMATITIS, ULCERATED  Benign healing tissue Recheck next visit  May return sooner if not healing

## 2019-11-30 DIAGNOSIS — G245 Blepharospasm: Secondary | ICD-10-CM | POA: Diagnosis not present

## 2020-02-22 DIAGNOSIS — G245 Blepharospasm: Secondary | ICD-10-CM | POA: Diagnosis not present

## 2020-02-27 DIAGNOSIS — E1142 Type 2 diabetes mellitus with diabetic polyneuropathy: Secondary | ICD-10-CM | POA: Diagnosis not present

## 2020-02-27 DIAGNOSIS — E7800 Pure hypercholesterolemia, unspecified: Secondary | ICD-10-CM | POA: Diagnosis not present

## 2020-02-27 DIAGNOSIS — I10 Essential (primary) hypertension: Secondary | ICD-10-CM | POA: Diagnosis not present

## 2020-02-27 DIAGNOSIS — Z23 Encounter for immunization: Secondary | ICD-10-CM | POA: Diagnosis not present

## 2020-02-27 DIAGNOSIS — J431 Panlobular emphysema: Secondary | ICD-10-CM | POA: Diagnosis not present

## 2020-02-27 DIAGNOSIS — Z79899 Other long term (current) drug therapy: Secondary | ICD-10-CM | POA: Diagnosis not present

## 2020-05-23 DIAGNOSIS — G245 Blepharospasm: Secondary | ICD-10-CM | POA: Diagnosis not present

## 2020-06-18 ENCOUNTER — Other Ambulatory Visit: Payer: Self-pay | Admitting: Family Medicine

## 2020-06-18 DIAGNOSIS — M7989 Other specified soft tissue disorders: Secondary | ICD-10-CM | POA: Diagnosis not present

## 2020-06-18 DIAGNOSIS — I739 Peripheral vascular disease, unspecified: Secondary | ICD-10-CM | POA: Diagnosis not present

## 2020-06-19 ENCOUNTER — Other Ambulatory Visit: Payer: Self-pay | Admitting: Family Medicine

## 2020-06-19 ENCOUNTER — Ambulatory Visit
Admission: RE | Admit: 2020-06-19 | Discharge: 2020-06-19 | Disposition: A | Discharge status: Home / Self Care | Payer: PPO | Source: Ambulatory Visit | Attending: Family Medicine | Admitting: Family Medicine

## 2020-06-19 ENCOUNTER — Other Ambulatory Visit: Payer: Self-pay

## 2020-06-19 DIAGNOSIS — I82491 Acute embolism and thrombosis of other specified deep vein of right lower extremity: Secondary | ICD-10-CM | POA: Diagnosis not present

## 2020-06-19 DIAGNOSIS — I2699 Other pulmonary embolism without acute cor pulmonale: Secondary | ICD-10-CM

## 2020-06-19 DIAGNOSIS — J431 Panlobular emphysema: Secondary | ICD-10-CM | POA: Diagnosis not present

## 2020-06-19 DIAGNOSIS — Z720 Tobacco use: Secondary | ICD-10-CM

## 2020-06-19 DIAGNOSIS — Z86718 Personal history of other venous thrombosis and embolism: Secondary | ICD-10-CM | POA: Insufficient documentation

## 2020-06-19 DIAGNOSIS — M7989 Other specified soft tissue disorders: Secondary | ICD-10-CM | POA: Insufficient documentation

## 2020-06-19 DIAGNOSIS — I700 Atherosclerosis of aorta: Secondary | ICD-10-CM | POA: Diagnosis not present

## 2020-06-19 DIAGNOSIS — I82401 Acute embolism and thrombosis of unspecified deep veins of right lower extremity: Secondary | ICD-10-CM | POA: Diagnosis not present

## 2020-06-19 DIAGNOSIS — I82431 Acute embolism and thrombosis of right popliteal vein: Secondary | ICD-10-CM | POA: Diagnosis not present

## 2020-06-19 DIAGNOSIS — I82411 Acute embolism and thrombosis of right femoral vein: Secondary | ICD-10-CM | POA: Diagnosis not present

## 2020-06-19 DIAGNOSIS — I824Z1 Acute embolism and thrombosis of unspecified deep veins of right distal lower extremity: Secondary | ICD-10-CM

## 2020-06-19 HISTORY — DX: Other pulmonary embolism without acute cor pulmonale: I26.99

## 2020-06-19 HISTORY — DX: Acute embolism and thrombosis of unspecified deep veins of right distal lower extremity: I82.4Z1

## 2020-06-19 LAB — POCT I-STAT CREATININE: Creatinine, Ser: 1 mg/dL (ref 0.61–1.24)

## 2020-06-19 MED ORDER — IOHEXOL 350 MG/ML SOLN
75.0000 mL | Freq: Once | INTRAVENOUS | Status: AC | PRN
  Administered 2020-06-19: 75 mL via INTRAVENOUS

## 2020-06-20 DIAGNOSIS — I2699 Other pulmonary embolism without acute cor pulmonale: Secondary | ICD-10-CM | POA: Insufficient documentation

## 2020-06-20 DIAGNOSIS — I824Z1 Acute embolism and thrombosis of unspecified deep veins of right distal lower extremity: Secondary | ICD-10-CM | POA: Insufficient documentation

## 2020-06-24 DIAGNOSIS — J431 Panlobular emphysema: Secondary | ICD-10-CM | POA: Diagnosis not present

## 2020-06-24 DIAGNOSIS — I82401 Acute embolism and thrombosis of unspecified deep veins of right lower extremity: Secondary | ICD-10-CM | POA: Diagnosis not present

## 2020-06-24 DIAGNOSIS — Z720 Tobacco use: Secondary | ICD-10-CM | POA: Diagnosis not present

## 2020-07-09 DIAGNOSIS — I2699 Other pulmonary embolism without acute cor pulmonale: Secondary | ICD-10-CM | POA: Diagnosis not present

## 2020-07-09 DIAGNOSIS — Z79899 Other long term (current) drug therapy: Secondary | ICD-10-CM | POA: Diagnosis not present

## 2020-07-09 DIAGNOSIS — I824Z1 Acute embolism and thrombosis of unspecified deep veins of right distal lower extremity: Secondary | ICD-10-CM | POA: Diagnosis not present

## 2020-07-09 DIAGNOSIS — J431 Panlobular emphysema: Secondary | ICD-10-CM | POA: Diagnosis not present

## 2020-08-16 DIAGNOSIS — G245 Blepharospasm: Secondary | ICD-10-CM | POA: Diagnosis not present

## 2020-08-27 DIAGNOSIS — Z125 Encounter for screening for malignant neoplasm of prostate: Secondary | ICD-10-CM | POA: Diagnosis not present

## 2020-08-27 DIAGNOSIS — Z0000 Encounter for general adult medical examination without abnormal findings: Secondary | ICD-10-CM | POA: Diagnosis not present

## 2020-08-27 DIAGNOSIS — J431 Panlobular emphysema: Secondary | ICD-10-CM | POA: Diagnosis not present

## 2020-08-27 DIAGNOSIS — I10 Essential (primary) hypertension: Secondary | ICD-10-CM | POA: Diagnosis not present

## 2020-08-27 DIAGNOSIS — E7800 Pure hypercholesterolemia, unspecified: Secondary | ICD-10-CM | POA: Diagnosis not present

## 2020-08-27 DIAGNOSIS — Z79899 Other long term (current) drug therapy: Secondary | ICD-10-CM | POA: Diagnosis not present

## 2020-08-27 DIAGNOSIS — I2699 Other pulmonary embolism without acute cor pulmonale: Secondary | ICD-10-CM | POA: Diagnosis not present

## 2020-08-27 DIAGNOSIS — E118 Type 2 diabetes mellitus with unspecified complications: Secondary | ICD-10-CM | POA: Diagnosis not present

## 2020-08-27 DIAGNOSIS — Z1211 Encounter for screening for malignant neoplasm of colon: Secondary | ICD-10-CM | POA: Diagnosis not present

## 2020-08-30 DIAGNOSIS — Z1211 Encounter for screening for malignant neoplasm of colon: Secondary | ICD-10-CM | POA: Diagnosis not present

## 2020-09-06 DIAGNOSIS — K921 Melena: Secondary | ICD-10-CM | POA: Diagnosis not present

## 2020-09-10 DIAGNOSIS — Z86010 Personal history of colonic polyps: Secondary | ICD-10-CM | POA: Diagnosis not present

## 2020-09-10 DIAGNOSIS — R195 Other fecal abnormalities: Secondary | ICD-10-CM | POA: Diagnosis not present

## 2020-09-10 DIAGNOSIS — Z8371 Family history of colonic polyps: Secondary | ICD-10-CM | POA: Diagnosis not present

## 2020-09-10 DIAGNOSIS — Z800 Family history of malignant neoplasm of digestive organs: Secondary | ICD-10-CM | POA: Diagnosis not present

## 2020-09-10 DIAGNOSIS — K5520 Angiodysplasia of colon without hemorrhage: Secondary | ICD-10-CM | POA: Diagnosis not present

## 2020-09-10 DIAGNOSIS — Z7901 Long term (current) use of anticoagulants: Secondary | ICD-10-CM | POA: Diagnosis not present

## 2020-09-10 DIAGNOSIS — I2699 Other pulmonary embolism without acute cor pulmonale: Secondary | ICD-10-CM | POA: Diagnosis not present

## 2020-09-27 DIAGNOSIS — R7989 Other specified abnormal findings of blood chemistry: Secondary | ICD-10-CM | POA: Diagnosis not present

## 2020-11-01 DIAGNOSIS — H903 Sensorineural hearing loss, bilateral: Secondary | ICD-10-CM | POA: Diagnosis not present

## 2020-11-08 DIAGNOSIS — G245 Blepharospasm: Secondary | ICD-10-CM | POA: Diagnosis not present

## 2020-11-08 DIAGNOSIS — E119 Type 2 diabetes mellitus without complications: Secondary | ICD-10-CM | POA: Diagnosis not present

## 2020-11-21 ENCOUNTER — Ambulatory Visit: Payer: PPO | Admitting: Dermatology

## 2020-11-21 ENCOUNTER — Encounter: Payer: Self-pay | Admitting: Dermatology

## 2020-11-21 ENCOUNTER — Other Ambulatory Visit: Payer: Self-pay

## 2020-11-21 DIAGNOSIS — D229 Melanocytic nevi, unspecified: Secondary | ICD-10-CM | POA: Diagnosis not present

## 2020-11-21 DIAGNOSIS — L720 Epidermal cyst: Secondary | ICD-10-CM | POA: Diagnosis not present

## 2020-11-21 DIAGNOSIS — Z85828 Personal history of other malignant neoplasm of skin: Secondary | ICD-10-CM

## 2020-11-21 DIAGNOSIS — Z1283 Encounter for screening for malignant neoplasm of skin: Secondary | ICD-10-CM

## 2020-11-21 DIAGNOSIS — L821 Other seborrheic keratosis: Secondary | ICD-10-CM | POA: Diagnosis not present

## 2020-11-21 DIAGNOSIS — R238 Other skin changes: Secondary | ICD-10-CM

## 2020-11-21 DIAGNOSIS — L578 Other skin changes due to chronic exposure to nonionizing radiation: Secondary | ICD-10-CM | POA: Diagnosis not present

## 2020-11-21 DIAGNOSIS — Z85820 Personal history of malignant melanoma of skin: Secondary | ICD-10-CM | POA: Diagnosis not present

## 2020-11-21 DIAGNOSIS — L820 Inflamed seborrheic keratosis: Secondary | ICD-10-CM | POA: Diagnosis not present

## 2020-11-21 DIAGNOSIS — D1800 Hemangioma unspecified site: Secondary | ICD-10-CM

## 2020-11-21 DIAGNOSIS — L814 Other melanin hyperpigmentation: Secondary | ICD-10-CM | POA: Diagnosis not present

## 2020-11-21 DIAGNOSIS — L570 Actinic keratosis: Secondary | ICD-10-CM

## 2020-11-21 NOTE — Patient Instructions (Addendum)
Seborrheic Keratosis  What causes seborrheic keratoses? Seborrheic keratoses are harmless, common skin growths that first appear during adult life.  As time goes by, more growths appear.  Some people may develop a large number of them.  Seborrheic keratoses appear on both covered and uncovered body parts.  They are not caused by sunlight.  The tendency to develop seborrheic keratoses can be inherited.  They vary in color from skin-colored to gray, brown, or even black.  They can be either smooth or have a rough, warty surface.   Seborrheic keratoses are superficial and look as if they were stuck on the skin.  Under the microscope this type of keratosis looks like layers upon layers of skin.  That is why at times the top layer may seem to fall off, but the rest of the growth remains and re-grows.    Treatment Seborrheic keratoses do not need to be treated, but can easily be removed in the office.  Seborrheic keratoses often cause symptoms when they rub on clothing or jewelry.  Lesions can be in the way of shaving.  If they become inflamed, they can cause itching, soreness, or burning.  Removal of a seborrheic keratosis can be accomplished by freezing, burning, or surgery. If any spot bleeds, scabs, or grows rapidly, please return to have it checked, as these can be an indication of a skin cancer.  If you have any questions or concerns for your doctor, please call our main line at [phone] and press option 4 to reach your doctor's medical assistant. If no one answers, please leave a voicemail as directed and we will return your call as soon as possible. Messages left after 4 pm will be answered the following business day.   You may also send [place] a message via [place]. We typically respond to MyChart messages within 1-2 business days.  For prescription refills, please ask your pharmacy to contact our office. Our fax number is [phone].  If you have an urgent issue when the clinic is closed that  cannot wait until the next business day, you can page your doctor at the number below.    Please note that while we do our best to be available for urgent issues outside of office hours, we are not available 24/7.   If you have an urgent issue and are unable to reach [place], you may choose to seek medical care at your doctor's office, retail clinic, urgent care center, or emergency room.  If you have a medical emergency, please immediately call 911 or go to the emergency department.  Pager Numbers  - Dr. Nehemiah Massed: [phone]  - Dr. Laurence Ferrari: [phone]  - Dr. Nicole Kindred: [phone]  In the event of inclement weather, please call our main line at [phone] for an update on the status of any delays or closures.  Dermatology Medication Tips: Please keep the boxes that topical medications come in in order to help keep track of the instructions about where and how to use these. Pharmacies typically print the medication instructions only on the boxes and not directly on the medication tubes.   If your medication is too expensive, please contact our office at [phone] option 4 or send [place] a message through [place].   We are unable to tell what your co-pay for medications will be in advance as this is different depending on your insurance coverage. However, we may be able to find a substitute medication at lower cost or fill out paperwork to get insurance to cover  a needed medication.   If a prior authorization is required to get your medication covered by your insurance company, please allow [place] 1-2 business days to complete this process.  Drug prices often vary depending on where the prescription is filled and some pharmacies may offer cheaper prices.  The website www.goodrx.com contains coupons for medications through different pharmacies. The prices here do not account for what the cost may be with help from insurance (it may be cheaper with your insurance), but the website can give you the  price if you did not use any insurance.  - You can print the associated coupon and take it with your prescription to the pharmacy.  - You may also stop by our office during regular business hours and pick up a GoodRx coupon card.  - If you need your prescription sent electronically to a different pharmacy, notify our office through [facility] or by phone at [phone] option 4.

## 2020-11-21 NOTE — Progress Notes (Signed)
Follow-Up Visit   Subjective  Jonathon Barrera is a 80 y.o. male who presents for the following: Annual Exam (Patient presents for TBSE. He has a growth on his left popliteal that is growing. He also has a growth on his left flank that is sore and stings. History of SCC of the left dorsum lateral hand and the left dorsum base of thumb (2018). He has a history of MM treated by Dr Koleen Nimrod many years ago on the upper back. ).  Patient presents with wife today who contributes to history.   The patient presents for Total-Body Skin Exam (TBSE) for skin cancer screening and mole check.   The following portions of the chart were reviewed this encounter and updated as appropriate:   Tobacco  Allergies  Meds  Problems  Med Hx  Surg Hx  Fam Hx     Review of Systems:  No other skin or systemic complaints except as noted in HPI or Assessment and Plan.  Objective  Well appearing patient in no apparent distress; mood and affect are within normal limits.  A full examination was performed including scalp, head, eyes, ears, nose, lips, neck, chest, axillae, abdomen, back, buttocks, bilateral upper extremities, bilateral lower extremities, hands, feet, fingers, toes, fingernails, and toenails. All findings within normal limits unless otherwise noted below.  bil ears Purple papules.  Dorsal Hands x 25 (25) Erythematous thin papules/macules with gritty scale.   L side x 1, L popliteal x 1 (2) Erythematous keratotic or waxy stuck-on papule or plaque.    Assessment & Plan  Lentigines - Scattered tan macules - Due to sun exposure - Benign-appering, observe - Recommend daily broad spectrum sunscreen SPF 30+ to sun-exposed areas, reapply every 2 hours as needed. - Call for any changes  Seborrheic Keratoses - Stuck-on, waxy, tan-brown papules and/or plaques  - Benign-appearing - Discussed benign etiology and prognosis. - Observe - Call for any changes  Melanocytic Nevi - Tan-brown  and/or pink-flesh-colored symmetric macules and papules - Benign appearing on exam today - Observation - Call clinic for new or changing moles - Recommend daily use of broad spectrum spf 30+ sunscreen to sun-exposed areas.   Hemangiomas - Red papules - Discussed benign nature - Observe - Call for any changes  Actinic Damage - Chronic condition, secondary to cumulative UV/sun exposure - diffuse scaly erythematous macules with underlying dyspigmentation - Recommend daily broad spectrum sunscreen SPF 30+ to sun-exposed areas, reapply every 2 hours as needed.  - Staying in the shade or wearing long sleeves, sun glasses (UVA+UVB protection) and wide brim hats (4-inch brim around the entire circumference of the hat) are also recommended for sun protection.  - Call for new or changing lesions.  Skin cancer screening performed today.  History of Squamous Cell Carcinoma of the Skin - No evidence of recurrence today - No lymphadenopathy - Recommend regular full body skin exams - Recommend daily broad spectrum sunscreen SPF 30+ to sun-exposed areas, reapply every 2 hours as needed.  - Call if any new or changing lesions are noted between office visits  Milia - tiny firm white papules of the face - type of cyst - benign - may be extracted if symptomatic - observe  History of Melanoma per patient - No evidence of recurrence today - No lymphadenopathy - Recommend regular full body skin exams - Recommend daily broad spectrum sunscreen SPF 30+ to sun-exposed areas, reapply every 2 hours as needed.  - Call if any new or changing  lesions are noted between office visits  Venous [place] bil ears Benign, observe.   AK (actinic keratosis) (25) Dorsal Hands x 25  Actinic keratoses are precancerous spots that appear secondary to cumulative UV radiation exposure/sun exposure over time. They are chronic with expected duration over 1 year. A portion of actinic keratoses will progress to squamous  cell carcinoma of the skin. It is not possible to reliably predict which spots will progress to skin cancer and so treatment is recommended to prevent development of skin cancer.  Recommend daily broad spectrum sunscreen SPF 30+ to sun-exposed areas, reapply every 2 hours as needed.  Recommend staying in the shade or wearing long sleeves, sun glasses (UVA+UVB protection) and wide brim hats (4-inch brim around the entire circumference of the hat). Call for new or changing lesions.  Destruction of lesion - Dorsal Hands x 25 Complexity: simple   Destruction method: cryotherapy   Informed consent: discussed and consent obtained   Timeout:  patient name, date of birth, surgical site, and procedure verified Lesion destroyed using liquid nitrogen: Yes   Region frozen until ice ball extended beyond lesion: Yes   Outcome: patient tolerated procedure well with no complications   Post-procedure details: wound care instructions given    Inflamed seborrheic keratosis L side x 1, L popliteal x 1  Destruction of lesion - L side x 1, L popliteal x 1 Complexity: simple   Destruction method: cryotherapy   Informed consent: discussed and consent obtained   Timeout:  patient name, date of birth, surgical site, and procedure verified Lesion destroyed using liquid nitrogen: Yes   Region frozen until ice ball extended beyond lesion: Yes   Outcome: patient tolerated procedure well with no complications   Post-procedure details: wound care instructions given    Skin cancer screening  Return in about 1 year (around 11/21/2021) for TBSE.  Lindi Adie, CMA, am acting as scribe for Sarina Ser, MD .  Documentation: I have reviewed the above documentation for accuracy and completeness, and I agree with the above.  Sarina Ser, MD

## 2020-12-10 DIAGNOSIS — I10 Essential (primary) hypertension: Secondary | ICD-10-CM | POA: Diagnosis not present

## 2020-12-10 DIAGNOSIS — Z79899 Other long term (current) drug therapy: Secondary | ICD-10-CM | POA: Diagnosis not present

## 2020-12-10 DIAGNOSIS — E118 Type 2 diabetes mellitus with unspecified complications: Secondary | ICD-10-CM | POA: Diagnosis not present

## 2020-12-10 DIAGNOSIS — I739 Peripheral vascular disease, unspecified: Secondary | ICD-10-CM | POA: Diagnosis not present

## 2020-12-10 DIAGNOSIS — E782 Mixed hyperlipidemia: Secondary | ICD-10-CM | POA: Diagnosis not present

## 2020-12-10 DIAGNOSIS — J431 Panlobular emphysema: Secondary | ICD-10-CM | POA: Diagnosis not present

## 2020-12-10 DIAGNOSIS — I82401 Acute embolism and thrombosis of unspecified deep veins of right lower extremity: Secondary | ICD-10-CM | POA: Diagnosis not present

## 2020-12-11 ENCOUNTER — Other Ambulatory Visit: Payer: Self-pay | Admitting: Internal Medicine

## 2020-12-11 DIAGNOSIS — M79604 Pain in right leg: Secondary | ICD-10-CM

## 2020-12-11 DIAGNOSIS — I82401 Acute embolism and thrombosis of unspecified deep veins of right lower extremity: Secondary | ICD-10-CM

## 2020-12-17 ENCOUNTER — Other Ambulatory Visit: Payer: Self-pay

## 2020-12-17 ENCOUNTER — Ambulatory Visit
Admission: RE | Admit: 2020-12-17 | Discharge: 2020-12-17 | Disposition: A | Discharge status: Home / Self Care | Payer: PPO | Source: Ambulatory Visit | Attending: Internal Medicine | Admitting: Internal Medicine

## 2020-12-17 DIAGNOSIS — M79604 Pain in right leg: Secondary | ICD-10-CM | POA: Diagnosis not present

## 2020-12-17 DIAGNOSIS — I82401 Acute embolism and thrombosis of unspecified deep veins of right lower extremity: Secondary | ICD-10-CM | POA: Insufficient documentation

## 2020-12-17 DIAGNOSIS — I82411 Acute embolism and thrombosis of right femoral vein: Secondary | ICD-10-CM | POA: Diagnosis not present

## 2020-12-17 DIAGNOSIS — I82431 Acute embolism and thrombosis of right popliteal vein: Secondary | ICD-10-CM | POA: Diagnosis not present

## 2020-12-18 DIAGNOSIS — I739 Peripheral vascular disease, unspecified: Secondary | ICD-10-CM | POA: Diagnosis not present

## 2021-01-13 ENCOUNTER — Other Ambulatory Visit (INDEPENDENT_AMBULATORY_CARE_PROVIDER_SITE_OTHER): Payer: Self-pay | Admitting: Nurse Practitioner

## 2021-01-13 DIAGNOSIS — I739 Peripheral vascular disease, unspecified: Secondary | ICD-10-CM

## 2021-01-14 ENCOUNTER — Ambulatory Visit (INDEPENDENT_AMBULATORY_CARE_PROVIDER_SITE_OTHER): Payer: PPO

## 2021-01-14 ENCOUNTER — Ambulatory Visit (INDEPENDENT_AMBULATORY_CARE_PROVIDER_SITE_OTHER): Payer: PPO | Admitting: Vascular Surgery

## 2021-01-14 ENCOUNTER — Encounter (INDEPENDENT_AMBULATORY_CARE_PROVIDER_SITE_OTHER): Payer: Self-pay | Admitting: Vascular Surgery

## 2021-01-14 ENCOUNTER — Other Ambulatory Visit: Payer: Self-pay

## 2021-01-14 VITALS — BP 171/90 | HR 68 | Resp 16 | Ht 68.0 in | Wt 188.0 lb

## 2021-01-14 DIAGNOSIS — E118 Type 2 diabetes mellitus with unspecified complications: Secondary | ICD-10-CM

## 2021-01-14 DIAGNOSIS — I70211 Atherosclerosis of native arteries of extremities with intermittent claudication, right leg: Secondary | ICD-10-CM

## 2021-01-14 DIAGNOSIS — J449 Chronic obstructive pulmonary disease, unspecified: Secondary | ICD-10-CM | POA: Insufficient documentation

## 2021-01-14 DIAGNOSIS — I739 Peripheral vascular disease, unspecified: Secondary | ICD-10-CM

## 2021-01-14 DIAGNOSIS — I824Z1 Acute embolism and thrombosis of unspecified deep veins of right distal lower extremity: Secondary | ICD-10-CM

## 2021-01-14 DIAGNOSIS — N400 Enlarged prostate without lower urinary tract symptoms: Secondary | ICD-10-CM | POA: Insufficient documentation

## 2021-01-14 DIAGNOSIS — I70219 Atherosclerosis of native arteries of extremities with intermittent claudication, unspecified extremity: Secondary | ICD-10-CM | POA: Insufficient documentation

## 2021-01-14 NOTE — Assessment & Plan Note (Signed)
blood glucose control important in reducing the progression of atherosclerotic disease. Also, involved in wound healing. On appropriate medications.  

## 2021-01-14 NOTE — Progress Notes (Signed)
Patient ID: Jonathon Barrera, male   DOB: 01/17/1941, 80 y.o.   MRN: [id number]  Chief Complaint  Patient presents with   New Patient (Initial Visit)    Ref Doy Hutching PAD    HPI [name] is a 80 y.o. male.  I am asked to see the patient by Dr. Doy Hutching for evaluation of right lower extremity claudication symptoms.  Patient has been noticing progressive pain and weakness in his right leg with activity over many months to years.  He was treated for a DVT in that right leg earlier this year which resulted in increased swelling and pain.  Has been on anticoagulation and tolerated this.  The pain is predominantly in the calf and related with significant distances of activity and walking.  This is now keeping him from doing many things he normally does this become very bothersome to him.  Has not really noticed as much in the left leg.  No open wounds or infection.  The pain does not wake him from sleep. To evaluate his lower extremity perfusion noninvasive studies were performed today demonstrating a right ABI of 0.79 with monophasic waveforms and a left ABI 0.93 with biphasic waveforms.     Past Medical History:  Diagnosis Date   Actinic keratosis    Bladder carcinoma (HCC)    BPH (benign prostatic hyperplasia)    Cancer (HCC)    MELANOMA   Colon polyps    COPD (chronic obstructive pulmonary disease) (HCC)    Diabetes mellitus without complication (HCC)    HOH (hard of hearing)    AIDS   Hyperlipemia    Hypertension    Mass    Lt lower cheek   Melanoma ([place]) 2000   Tx by Dr Koleen Nimrod   Skin sore    Squamous cell carcinoma of skin 02/01/2017   Left dorsum latera. hand. [facility]   Squamous cell carcinoma of skin 02/01/2017   Left dorsum base of thumb. [facility]    Past Surgical History:  Procedure Laterality Date   back skin cancer     BLADDER SURGERY     CARDIAC CATHETERIZATION     CATARACT EXTRACTION W/PHACO Right 04/30/2015   Procedure: CATARACT EXTRACTION PHACO AND INTRAOCULAR  LENS PLACEMENT (IOC);  Surgeon: Birder Robson, MD;  Location: [facility] ORS;  Service: Ophthalmology;  Laterality: Right;  [place] 01:03 AP% 21.4 CDE  13.57 fluid pack lot # [id number]   CATARACT EXTRACTION W/PHACO Left 05/21/2015   Procedure: CATARACT EXTRACTION PHACO AND INTRAOCULAR LENS PLACEMENT (IOC);  Surgeon: Birder Robson, MD;  Location: [facility] ORS;  Service: Ophthalmology;  Laterality: Left;  [place] 00:52 AP% 18.9 CDE 9.95 fluid pack lot #[id number]   COLONOSCOPY WITH PROPOFOL N/A 11/05/2014   Procedure: COLONOSCOPY WITH PROPOFOL;  Surgeon: Hulen Luster, MD;  Location: [facility] ENDOSCOPY;  Service: Gastroenterology;  Laterality: N/A;   HERNIA REPAIR     inguinal   KYPHOPLASTY N/A 02/23/2019   Procedure: T12 KYPHOPLASTY;  Surgeon: Hessie Knows, MD;  Location: [facility] ORS;  Service: Orthopedics;  Laterality: N/A;   LESION EXCISION N/A 02/23/2018   Procedure: EXCISION SCALP CYST AND FACIAL CYST;  Surgeon: Herbert Pun, MD;  Location: [facility] ORS;  Service: General;  Laterality: N/A;   SHOULDER ARTHROSCOPY W/ ROTATOR CUFF REPAIR Right    TUR-BT       Family History  Problem Relation Age of Onset   Diabetes Mother    Colon cancer Mother    Diabetes Father   No bleeding or clotting  disorders   Social History   Tobacco Use   Smoking status: Every Day    Packs/day: 0.50    Types: Cigarettes   Smokeless tobacco: Never  Vaping Use   Vaping Use: Never used  Substance Use Topics   Alcohol use: Yes    Alcohol/week: 12.0 standard drinks    Types: 12 Cans of beer per week   Drug use: No     No Known Allergies  Current Outpatient Medications  Medication Sig Dispense Refill   Artificial Tear Solution (SOOTHE XP) SOLN Place 1 drop into both eyes 3 (three) times daily as needed (dry eyes).     ELIQUIS 5 MG TABS tablet      finasteride (PROSCAR) 5 MG tablet Take 5 mg by mouth daily.     glimepiride (AMARYL) 4 MG tablet Take 4 mg by mouth every morning.     hydrochlorothiazide (HYDRODIURIL) 25  MG tablet Take 25 mg by mouth daily.     Misc Natural Products (URINOZINC PLUS PO) Take 1 tablet by mouth 2 (two) times daily.      telmisartan (MICARDIS) 80 MG tablet Take 80 mg by mouth daily.     No current facility-administered medications for this visit.      REVIEW OF SYSTEMS (Negative unless checked)  Constitutional: [] Weight loss  [] Fever  [] Chills Cardiac: [] Chest pain   [] Chest pressure   [] Palpitations   [] Shortness of breath when laying flat   [] Shortness of breath at rest   [] Shortness of breath with exertion. Vascular:  [x] Pain in legs with walking   [] Pain in legs at rest   [] Pain in legs when laying flat   [x] Claudication   [] Pain in feet when walking  [] Pain in feet at rest  [] Pain in feet when laying flat   [x] History of DVT   [] Phlebitis   [x] Swelling in legs   [] Varicose veins   [] Non-healing ulcers Pulmonary:   [] Uses home oxygen   [] Productive cough   [] Hemoptysis   [] Wheeze  [] COPD   [] Asthma Neurologic:  [] Dizziness  [] Blackouts   [] Seizures   [] History of stroke   [] History of TIA  [] Aphasia   [] Temporary blindness   [] Dysphagia   [] Weakness or numbness in arms   [] Weakness or numbness in legs Musculoskeletal:  [x] Arthritis   [] Joint swelling   [x] Joint pain   [] Low back pain Hematologic:  [] Easy bruising  [] Easy bleeding   [] Hypercoagulable state   [] Anemic  [] Hepatitis Gastrointestinal:  [] Blood in stool   [] Vomiting blood  [] Gastroesophageal reflux/heartburn   [] Abdominal pain Genitourinary:  [] Chronic kidney disease   [] Difficult urination  [] Frequent urination  [] Burning with urination   [] Hematuria Skin:  [] Rashes   [] Ulcers   [] Wounds Psychological:  [] History of anxiety   []  History of major depression.    Physical Exam BP (!) 171/90 (BP Location: Right Arm)   Pulse 68   Resp 16   Ht 5\' 8"  (1.727 m)   Wt 188 lb (85.3 kg)   BMI 28.59 kg/m  Gen:  WD/WN, NAD Head: [place]/AT, No temporalis wasting.  Ear/Nose/Throat: Hearing diminished, nares w/o erythema or  drainage, oropharynx w/o Erythema/Exudate Eyes: Conjunctiva clear, sclera non-icteric  Neck: trachea midline.  No JVD.  Pulmonary:  Good air movement, respirations not labored, no use of accessory muscles  Cardiac: RRR, no JVD Vascular:  Vessel Right Left  Radial Palpable Palpable  DP 1+ 2+  PT trace 1+   Gastrointestinal:. No masses, surgical incisions, or scars. Musculoskeletal: M/S 5/5 throughout.  Extremities without ischemic changes.  No deformity or atrophy. Mild RLE edema. Neurologic: Sensation grossly intact in extremities.  Symmetrical.  Speech is fluent. Motor exam as listed above. Psychiatric: Judgment intact, Mood & affect appropriate for pt's clinical situation. Dermatologic: No rashes or ulcers noted.  No cellulitis or open wounds.    Radiology US Venous Img Lower Unilateral Right (DVT)  Result Date: 12/17/2020 CLINICAL DATA:  Follow-up DVT EXAM: RIGHT LOWER EXTREMITY VENOUS DOPPLER ULTRASOUND TECHNIQUE: Gray-scale sonography with graded compression, as well as color Doppler and duplex ultrasound were performed to evaluate the lower extremity deep venous systems from the level of the common femoral vein and including the common femoral, femoral, profunda femoral, popliteal and calf veins including the posterior tibial, peroneal and gastrocnemius veins when visible. The superficial great saphenous vein was also interrogated. Spectral Doppler was utilized to evaluate flow at rest and with distal augmentation maneuvers in the common femoral, femoral and popliteal veins. COMPARISON:  None. FINDINGS: Contralateral Common Femoral Vein: Respiratory phasicity is normal and symmetric with the symptomatic side. No evidence of thrombus. Normal compressibility. Common Femoral Vein: No evidence of thrombus. Normal compressibility, respiratory phasicity and response to augmentation. Saphenofemoral Junction: No evidence of thrombus. Normal compressibility and flow  on color Doppler imaging. Profunda Femoral Vein: No evidence of thrombus. Normal compressibility and flow on color Doppler imaging. Femoral Vein: Interval recanalization of the most proximal femoral vein in the upper thigh. However, the vessel remains filled with occlusive thrombus in the mid thigh extending into the distal thigh. Popliteal Vein: Occlusive thrombus extends into the popliteal vein. Calf Veins: Occlusive thrombus extends into 1 of the paired peroneal veins. The other peroneal vein has regain patency. Both tibial veins have regained patency. Superficial Great Saphenous Vein: No evidence of thrombus. Normal compressibility. Venous Reflux:  None. Other Findings:  None. IMPRESSION: Slight interval improvement of right lower extremity DVT with restored patency in the femoral vein in the most proximal thigh, within the posterior tibial veins in the calf and within 1 of the paired peroneal veins in the calf. Persistent occlusive thrombus in the femoral vein in the mid and distal thigh, the popliteal vein and the other of the paired peroneal veins. Electronically Signed   By: Jacqulynn Cadet M.D.   On: 12/17/2020 13:44    Labs No results found for this or any previous visit (from the past 2160 hour(s)).  Assessment/Plan:  Atherosclerosis of native arteries of extremity with intermittent claudication (HCC) To evaluate his lower extremity perfusion noninvasive studies were performed today demonstrating a right ABI of 0.79 with monophasic waveforms and a left ABI 0.93 with biphasic waveforms.    Recommend:  The patient has experienced increased symptoms and is now describing lifestyle limiting claudication and mild rest pain.   Given the severity of the patient's lower extremity symptoms the patient should undergo right lower extremity angiography and intervention.  Risk and benefits were reviewed the patient.  Indications for the procedure were reviewed.  All questions were answered, the  patient agrees to proceed.   The patient should continue walking and begin a more formal exercise program.  The patient should continue antiplatelet therapy and aggressive treatment of the lipid abnormalities  The patient will follow up with me after the angiogram.   Acute deep vein thrombosis (DVT) of distal vein of right lower extremity ([place]) Diagnosed earlier this year.  Has been on anticoagulation now for about 6 months.  This may be contributing to some of his right leg symptoms as well.  He will continue anticoagulation which we can also use Eliquis to help treat his peripheral arterial disease.  He may also benefit from compression socks and elevation after his revascularization as he may have some increased reperfusion swelling.  Type II diabetes mellitus with complication (HCC) blood glucose control important in reducing the progression of atherosclerotic disease. Also, involved in wound healing. On appropriate medications.      [name] 01/14/2021, 12:35 PM   This note was created with Dragon medical transcription system.  Any errors from dictation are unintentional.

## 2021-01-14 NOTE — Assessment & Plan Note (Signed)
To evaluate his lower extremity perfusion noninvasive studies were performed today demonstrating a right ABI of 0.79 with monophasic waveforms and a left ABI 0.93 with biphasic waveforms.    Recommend:  The patient has experienced increased symptoms and is now describing lifestyle limiting claudication and mild rest pain.   Given the severity of the patient's lower extremity symptoms the patient should undergo right lower extremity angiography and intervention.  Risk and benefits were reviewed the patient.  Indications for the procedure were reviewed.  All questions were answered, the patient agrees to proceed.   The patient should continue walking and begin a more formal exercise program.  The patient should continue antiplatelet therapy and aggressive treatment of the lipid abnormalities  The patient will follow up with me after the angiogram.

## 2021-01-14 NOTE — Patient Instructions (Signed)
Endovascular Therapy for Peripheral Vascular Disease, Care After The following information offers guidance on how to care for yourself after your procedure. Your health care provider may also give you more specific instructions. If you have problems or questions, contact your health care provider. What can I expect after the procedure? After the procedure, it is common to have: Pain. Soreness and bruising around your puncture or incision (access site). Fatigue. Follow these instructions at home: Access site care  Follow instructions from your health care provider about how to take care of your access site. Make sure you: Wash your hands with soap and water for at least 20 seconds before and after you change your bandage (dressing). If soap and water are not available, use hand sanitizer. Change your dressing as told by your health care provider. Leave stitches (sutures), skin glue, or adhesive strips in place. If adhesive strip edges start to loosen and curl up, you may trim the loose edges. Do not remove adhesive strips or skin glue completely unless your health care provider tells you to do that. Check your access site every day for signs of infection. Check for: More redness, swelling, or pain. A lump or bump. Fluid or blood. Warmth. Pus or a bad smell. Medicines Take over-the-counter and prescription medicines only as told by your health care provider. You may need to take medicines to prevent blood clots and to lower your cholesterol. If you were prescribed an antibiotic medicine, take it as told by your health care provider. Do not stop taking the antibiotic even if you start to feel better. Driving Do not drive until your health care provider approves. If you were given a sedative during the procedure, it can affect you for several hours. Do not drive or operate machinery until your health care provider says that it is safe. Ask your health care provider if the medicine prescribed to  you requires you to avoid driving or using machinery. Activity Rest as told by your health care provider. Avoid sitting for a long time without moving. Get up to take short walks every 1-2 hours. This is important to improve blood flow and breathing. Ask for help if you feel weak or unsteady. Do not lift anything that is heavier than 10 lb (4.5 kg), or the limit that you are told, until your health care provider says that it is safe. Avoid activity that requires a lot of effort, such as exercise and sports, as told by your health care provider. Avoid sexual activity until your health care provider says it is safe. Follow your exercise plan as told by your health care provider. Return to your normal activities as told by your health care provider. Ask your health care provider what activities are safe for you. Eating and drinking Drink fluids as instructed to help flush out the dye used during the procedure. Follow instructions from your health care provider about eating or drinking restrictions. You may need to eat a diet that is low in salt (sodium) and fat. Avoid drinking alcohol. General instructions Do not take baths, swim, or use a hot tub until your health care provider approves. Ask your health care provider if you may take showers. You may only be allowed to take sponge baths. Do not use any products that contain nicotine or tobacco. These products include cigarettes, chewing tobacco, and vaping devices, such as e-cigarettes. If you need help quitting, ask your health care provider. Keep all follow-up visits. This is important. Contact a health care  provider if: You have a fever. You have severe pain that does not get better with medicine. You have more redness, swelling, or pain around your access site. You have a lump or bump at your access site. Get help right away if:  You have fluid or blood coming from your access site. If this happens, lie down on your back and apply pressure  to the area. You have chest pain. You have problems breathing. You have pain, numbness, or tingling in your legs. You faint. You have any symptoms of a stroke. "BE FAST" is an easy way to remember the main warning signs of a stroke: B - Balance. Signs are dizziness, sudden trouble walking, or loss of balance. E - Eyes. Signs are trouble seeing or a sudden change in vision. F - Face. Signs are sudden weakness or numbness of the face, or the face or eyelid drooping on one side. A - Arms. Signs are weakness or numbness in an arm. This happens suddenly and usually on one side of the body. S - Speech. Signs are sudden trouble speaking, slurred speech, or trouble understanding what people say. T - Time. Time to call emergency services. Write down what time symptoms started. You have other signs of a stroke, such as: A sudden, severe headache with no known cause. Nausea or vomiting. Seizure. These symptoms may represent a serious problem that is an emergency. Do not wait to see if the symptoms will go away. Get medical help right away. Call your local emergency services (911 in the [place] Do not drive yourself to the hospital. Summary After the procedure, it is common to have pain and soreness near your puncture or incision (access site). Check your access site every day for signs of infection, such as redness, swelling, or pain. You may need to take medicines to prevent blood clots and to lower your cholesterol. If you have any signs of a stroke, get help right away. This information is not intended to replace advice given to you by your health care provider. Make sure you discuss any questions you have with your health care provider. Document Revised: 10/09/2019 Document Reviewed: 10/09/2019 Elsevier Patient Education  [place].

## 2021-01-14 NOTE — H&P (View-Only) (Signed)
Patient ID: [name], male   DOB: [birth year], 80 y.o.   MRN: [id number]  Chief Complaint  Patient presents with   New Patient (Initial Visit)    Ref Jonathon Barrera    HPI Jonathon Barrera is a 80 y.o. male.  I am asked to see the patient by Dr. Doy Barrera for evaluation of right lower extremity claudication symptoms.  Patient has been noticing progressive Barrera and weakness in his right leg with activity over many months to years.  He was treated for a DVT in that right leg earlier this year which resulted in increased swelling and Barrera.  Has been on anticoagulation and tolerated this.  The Barrera is predominantly in the calf and related with significant distances of activity and walking.  This is now keeping him from doing many things he normally does this become very bothersome to him.  Has not really noticed as much in the left leg.  No open wounds or infection.  The Barrera does not wake him from sleep. To evaluate his lower extremity perfusion noninvasive studies were performed today demonstrating a right ABI of 0.79 with monophasic waveforms and a left ABI 0.93 with biphasic waveforms.     Past Medical History:  Diagnosis Date   Actinic keratosis    Bladder carcinoma (HCC)    BPH (benign prostatic hyperplasia)    Cancer (HCC)    MELANOMA   Colon polyps    COPD (chronic obstructive pulmonary disease) (HCC)    Diabetes mellitus without complication (HCC)    HOH (hard of hearing)    AIDS   Hyperlipemia    Hypertension    Mass    Lt lower cheek   Melanoma ([place]) 2000   Tx by Dr Jonathon Barrera   Skin sore    Squamous cell carcinoma of skin 02/01/2017   Left dorsum latera. hand. ED[facility]   Squamous cell carcinoma of skin 02/01/2017   Left dorsum base of thumb. [facility]    Past Surgical History:  Procedure Laterality Date   back skin cancer     BLADDER SURGERY     CARDIAC CATHETERIZATION     CATARACT EXTRACTION W/PHACO Right 04/30/2015   Procedure: CATARACT EXTRACTION PHACO AND INTRAOCULAR  LENS PLACEMENT (IOC);  Surgeon: Jonathon Robson, MD;  Location: [facility] ORS;  Service: Ophthalmology;  Laterality: Right;  [place] 01:03 AP% 21.4 CDE  13.57 fluid pack lot # [id number]   CATARACT EXTRACTION W/PHACO Left 05/21/2015   Procedure: CATARACT EXTRACTION PHACO AND INTRAOCULAR LENS PLACEMENT (IOC);  Surgeon: Jonathon Robson, MD;  Location: [facility] ORS;  Service: Ophthalmology;  Laterality: Left;  [place] 00:52 AP% 18.9 CDE 9.95 fluid pack lot #[id number]   COLONOSCOPY WITH PROPOFOL N/A 11/05/2014   Procedure: COLONOSCOPY WITH PROPOFOL;  Surgeon: Jonathon Luster, MD;  Location: [facility] ENDOSCOPY;  Service: Gastroenterology;  Laterality: N/A;   HERNIA REPAIR     inguinal   KYPHOPLASTY N/A 02/23/2019   Procedure: T12 KYPHOPLASTY;  Surgeon: Jonathon Knows, MD;  Location: [facility] ORS;  Service: Orthopedics;  Laterality: N/A;   LESION EXCISION N/A 02/23/2018   Procedure: EXCISION SCALP CYST AND FACIAL CYST;  Surgeon: Jonathon Pun, MD;  Location: [facility] ORS;  Service: General;  Laterality: N/A;   SHOULDER ARTHROSCOPY W/ ROTATOR CUFF REPAIR Right    TUR-BT       Family History  Problem Relation Age of Onset   Diabetes Mother    Colon cancer Mother    Diabetes Father   No bleeding or clotting  disorders   Social History   Tobacco Use   Smoking status: Every Day    Packs/day: 0.50    Types: Cigarettes   Smokeless tobacco: Never  Vaping Use   Vaping Use: Never used  Substance Use Topics   Alcohol use: Yes    Alcohol/week: 12.0 standard drinks    Types: 12 Cans of beer per week   Drug use: No     No Known Allergies  Current Outpatient Medications  Medication Sig Dispense Refill   Artificial Tear Solution (SOOTHE XP) SOLN Place 1 drop into both eyes 3 (three) times daily as needed (dry eyes).     ELIQUIS 5 MG TABS tablet      finasteride (PROSCAR) 5 MG tablet Take 5 mg by mouth daily.     glimepiride (AMARYL) 4 MG tablet Take 4 mg by mouth every morning.     hydrochlorothiazide (HYDRODIURIL) 25  MG tablet Take 25 mg by mouth daily.     Misc Natural Products (URINOZINC PLUS PO) Take 1 tablet by mouth 2 (two) times daily.      telmisartan (MICARDIS) 80 MG tablet Take 80 mg by mouth daily.     No current facility-administered medications for this visit.      REVIEW OF SYSTEMS (Negative unless checked)  Constitutional: [] Weight loss  [] Fever  [] Chills Cardiac: [] Chest Barrera   [] Chest pressure   [] Palpitations   [] Shortness of breath when laying flat   [] Shortness of breath at rest   [] Shortness of breath with exertion. Vascular:  [x] Barrera in legs with walking   [] Barrera in legs at rest   [] Barrera in legs when laying flat   [x] Claudication   [] Barrera in feet when walking  [] Barrera in feet at rest  [] Barrera in feet when laying flat   [x] History of DVT   [] Phlebitis   [x] Swelling in legs   [] Varicose veins   [] Non-healing ulcers Pulmonary:   [] Uses home oxygen   [] Productive cough   [] Hemoptysis   [] Wheeze  [] COPD   [] Asthma Neurologic:  [] Dizziness  [] Blackouts   [] Seizures   [] History of stroke   [] History of TIA  [] Aphasia   [] Temporary blindness   [] Dysphagia   [] Weakness or numbness in arms   [] Weakness or numbness in legs Musculoskeletal:  [x] Arthritis   [] Joint swelling   [x] Joint Barrera   [] Low back Barrera Hematologic:  [] Easy bruising  [] Easy bleeding   [] Hypercoagulable state   [] Anemic  [] Hepatitis Gastrointestinal:  [] Blood in stool   [] Vomiting blood  [] Gastroesophageal reflux/heartburn   [] Abdominal Barrera Genitourinary:  [] Chronic kidney disease   [] Difficult urination  [] Frequent urination  [] Burning with urination   [] Hematuria Skin:  [] Rashes   [] Ulcers   [] Wounds Psychological:  [] History of anxiety   []  History of major depression.    Physical Exam BP (!) 171/90 (BP Location: Right Arm)   Pulse 68   Resp 16   Ht 5\' 8"  (1.727 m)   Wt 188 lb (85.3 kg)   BMI 28.59 kg/m  Gen:  WD/WN, NAD Head: [place]/AT, No temporalis wasting.  Ear/Nose/Throat: Hearing diminished, nares w/o erythema or  drainage, oropharynx w/o Erythema/Exudate Eyes: Conjunctiva clear, sclera non-icteric  Neck: trachea midline.  No JVD.  Pulmonary:  Good air movement, respirations not labored, no use of accessory muscles  Cardiac: RRR, no JVD Vascular:  Vessel Right Left  Radial Palpable Palpable  DP 1+ 2+  PT trace 1+   Gastrointestinal:. No masses, surgical incisions, or scars. Musculoskeletal: M/S 5/5 throughout.  Extremities without ischemic changes.  No deformity or atrophy. Mild RLE edema. Neurologic: Sensation grossly intact in extremities.  Symmetrical.  Speech is fluent. Motor exam as listed above. Psychiatric: Judgment intact, Mood & affect appropriate for pt's clinical situation. Dermatologic: No rashes or ulcers noted.  No cellulitis or open wounds.    Radiology US Venous Img Lower Unilateral Right (DVT)  Result Date: 12/17/2020 CLINICAL DATA:  Follow-up DVT EXAM: RIGHT LOWER EXTREMITY VENOUS DOPPLER ULTRASOUND TECHNIQUE: Gray-scale sonography with graded compression, as well as color Doppler and duplex ultrasound were performed to evaluate the lower extremity deep venous systems from the level of the common femoral vein and including the common femoral, femoral, profunda femoral, popliteal and calf veins including the posterior tibial, peroneal and gastrocnemius veins when visible. The superficial great saphenous vein was also interrogated. Spectral Doppler was utilized to evaluate flow at rest and with distal augmentation maneuvers in the common femoral, femoral and popliteal veins. COMPARISON:  None. FINDINGS: Contralateral Common Femoral Vein: Respiratory phasicity is normal and symmetric with the symptomatic side. No evidence of thrombus. Normal compressibility. Common Femoral Vein: No evidence of thrombus. Normal compressibility, respiratory phasicity and response to augmentation. Saphenofemoral Junction: No evidence of thrombus. Normal compressibility and flow  on color Doppler imaging. Profunda Femoral Vein: No evidence of thrombus. Normal compressibility and flow on color Doppler imaging. Femoral Vein: Interval recanalization of the most proximal femoral vein in the upper thigh. However, the vessel remains filled with occlusive thrombus in the mid thigh extending into the distal thigh. Popliteal Vein: Occlusive thrombus extends into the popliteal vein. Calf Veins: Occlusive thrombus extends into 1 of the paired peroneal veins. The other peroneal vein has regain patency. Both tibial veins have regained patency. Superficial Great Saphenous Vein: No evidence of thrombus. Normal compressibility. Venous Reflux:  None. Other Findings:  None. IMPRESSION: Slight interval improvement of right lower extremity DVT with restored patency in the femoral vein in the most proximal thigh, within the posterior tibial veins in the calf and within 1 of the paired peroneal veins in the calf. Persistent occlusive thrombus in the femoral vein in the mid and distal thigh, the popliteal vein and the other of the paired peroneal veins. Electronically Signed   By: Jacqulynn Cadet M.D.   On: 12/17/2020 13:44    Labs No results found for this or any previous visit (from the past 2160 hour(s)).  Assessment/Plan:  Atherosclerosis of native arteries of extremity with intermittent claudication (HCC) To evaluate his lower extremity perfusion noninvasive studies were performed today demonstrating a right ABI of 0.79 with monophasic waveforms and a left ABI 0.93 with biphasic waveforms.    Recommend:  The patient has experienced increased symptoms and is now describing lifestyle limiting claudication and mild rest Barrera.   Given the severity of the patient's lower extremity symptoms the patient should undergo right lower extremity angiography and intervention.  Risk and benefits were reviewed the patient.  Indications for the procedure were reviewed.  All questions were answered, the  patient agrees to proceed.   The patient should continue walking and begin a more formal exercise program.  The patient should continue antiplatelet therapy and aggressive treatment of the lipid abnormalities  The patient will follow up with me after the angiogram.   Acute deep vein thrombosis (DVT) of distal vein of right lower extremity ([place]) Diagnosed earlier this year.  Has been on anticoagulation now for about 6 months.  This may be contributing to some of his right leg symptoms as well.  He will continue anticoagulation which we can also use Eliquis to help treat his peripheral arterial disease.  He may also benefit from compression socks and elevation after his revascularization as he may have some increased reperfusion swelling.  Type II diabetes mellitus with complication (HCC) blood glucose control important in reducing the progression of atherosclerotic disease. Also, involved in wound healing. On appropriate medications.      Jonathon Barrera 01/14/2021, 12:35 PM   This note was created with Dragon medical transcription system.  Any errors from dictation are unintentional.

## 2021-01-14 NOTE — Assessment & Plan Note (Signed)
Diagnosed earlier this year.  Has been on anticoagulation now for about 6 months.  This may be contributing to some of his right leg symptoms as well.  He will continue anticoagulation which we can also use Eliquis to help treat his peripheral arterial disease.  He may also benefit from compression socks and elevation after his revascularization as he may have some increased reperfusion swelling.

## 2021-01-15 ENCOUNTER — Encounter (INDEPENDENT_AMBULATORY_CARE_PROVIDER_SITE_OTHER): Payer: Self-pay

## 2021-01-15 ENCOUNTER — Telehealth (INDEPENDENT_AMBULATORY_CARE_PROVIDER_SITE_OTHER): Payer: Self-pay

## 2021-01-15 NOTE — Telephone Encounter (Signed)
Patient is schedule for right lower extremity on 01/23/21 with Dr Lucky Cowboy arrival time 6:45 am at [facility]. Pre-procedure instructions will be mailed out to patient

## 2021-01-21 DIAGNOSIS — E118 Type 2 diabetes mellitus with unspecified complications: Secondary | ICD-10-CM | POA: Diagnosis not present

## 2021-01-21 DIAGNOSIS — J431 Panlobular emphysema: Secondary | ICD-10-CM | POA: Diagnosis not present

## 2021-01-21 DIAGNOSIS — E7800 Pure hypercholesterolemia, unspecified: Secondary | ICD-10-CM | POA: Diagnosis not present

## 2021-01-21 DIAGNOSIS — I2699 Other pulmonary embolism without acute cor pulmonale: Secondary | ICD-10-CM | POA: Diagnosis not present

## 2021-01-21 DIAGNOSIS — I10 Essential (primary) hypertension: Secondary | ICD-10-CM | POA: Diagnosis not present

## 2021-01-21 DIAGNOSIS — Z79899 Other long term (current) drug therapy: Secondary | ICD-10-CM | POA: Diagnosis not present

## 2021-01-21 DIAGNOSIS — Z23 Encounter for immunization: Secondary | ICD-10-CM | POA: Diagnosis not present

## 2021-01-23 ENCOUNTER — Ambulatory Visit
Admission: RE | Admit: 2021-01-23 | Discharge: 2021-01-23 | Disposition: A | Discharge status: Home / Self Care | Payer: PPO | Attending: Vascular Surgery | Admitting: Vascular Surgery

## 2021-01-23 ENCOUNTER — Encounter
Admission: RE | Disposition: A | Discharge status: Home / Self Care | Payer: Self-pay | Source: Home / Self Care | Attending: Vascular Surgery

## 2021-01-23 ENCOUNTER — Other Ambulatory Visit (INDEPENDENT_AMBULATORY_CARE_PROVIDER_SITE_OTHER): Payer: Self-pay | Admitting: Nurse Practitioner

## 2021-01-23 ENCOUNTER — Encounter: Payer: Self-pay | Admitting: Vascular Surgery

## 2021-01-23 DIAGNOSIS — Z79899 Other long term (current) drug therapy: Secondary | ICD-10-CM | POA: Insufficient documentation

## 2021-01-23 DIAGNOSIS — F17210 Nicotine dependence, cigarettes, uncomplicated: Secondary | ICD-10-CM | POA: Diagnosis not present

## 2021-01-23 DIAGNOSIS — Z833 Family history of diabetes mellitus: Secondary | ICD-10-CM | POA: Insufficient documentation

## 2021-01-23 DIAGNOSIS — E1151 Type 2 diabetes mellitus with diabetic peripheral angiopathy without gangrene: Secondary | ICD-10-CM | POA: Diagnosis not present

## 2021-01-23 DIAGNOSIS — Z86718 Personal history of other venous thrombosis and embolism: Secondary | ICD-10-CM | POA: Insufficient documentation

## 2021-01-23 DIAGNOSIS — I70211 Atherosclerosis of native arteries of extremities with intermittent claudication, right leg: Secondary | ICD-10-CM | POA: Diagnosis not present

## 2021-01-23 DIAGNOSIS — Z7984 Long term (current) use of oral hypoglycemic drugs: Secondary | ICD-10-CM | POA: Insufficient documentation

## 2021-01-23 DIAGNOSIS — I70219 Atherosclerosis of native arteries of extremities with intermittent claudication, unspecified extremity: Secondary | ICD-10-CM

## 2021-01-23 DIAGNOSIS — Z7901 Long term (current) use of anticoagulants: Secondary | ICD-10-CM | POA: Insufficient documentation

## 2021-01-23 HISTORY — PX: LOWER EXTREMITY ANGIOGRAPHY: CATH118251

## 2021-01-23 LAB — CREATININE, SERUM
Creatinine, Ser: 0.9 mg/dL (ref 0.61–1.24)
GFR, Estimated: >60 mL/min (ref >60)

## 2021-01-23 LAB — BUN: BUN: 27 mg/dL — ABNORMAL HIGH (ref 8–23)

## 2021-01-23 LAB — GLUCOSE, CAPILLARY: Glucose-Capillary: 157 mg/dL — ABNORMAL HIGH (ref 70–99)

## 2021-01-23 SURGERY — LOWER EXTREMITY ANGIOGRAPHY
Anesthesia: Moderate Sedation | Site: Leg Lower | Laterality: Right

## 2021-01-23 MED ORDER — CEFAZOLIN SODIUM-DEXTROSE 2-4 GM/100ML-% IV SOLN: 2.0000 g | Freq: Once | INTRAVENOUS | Status: AC

## 2021-01-23 MED ORDER — HEPARIN SODIUM (PORCINE) 1000 UNIT/ML IJ SOLN
INTRAMUSCULAR | Status: AC
  Filled 2021-01-23: qty 1

## 2021-01-23 MED ORDER — ONDANSETRON HCL 4 MG/2ML IJ SOLN: 4.0000 mg | Freq: Four times a day (QID) | INTRAMUSCULAR | Status: DC | PRN

## 2021-01-23 MED ORDER — MIDAZOLAM HCL 2 MG/ML PO SYRP: 8.0000 mg | ORAL_SOLUTION | Freq: Once | ORAL | Status: DC | PRN

## 2021-01-23 MED ORDER — DIPHENHYDRAMINE HCL 50 MG/ML IJ SOLN: 50.0000 mg | Freq: Once | INTRAMUSCULAR | Status: DC | PRN

## 2021-01-23 MED ORDER — CEFAZOLIN SODIUM-DEXTROSE 2-4 GM/100ML-% IV SOLN
INTRAVENOUS | Status: AC
  Administered 2021-01-23: 2 g via INTRAVENOUS
  Filled 2021-01-23: qty 100

## 2021-01-23 MED ORDER — HYDROMORPHONE HCL 1 MG/ML IJ SOLN
1.0000 mg | Freq: Once | INTRAMUSCULAR | Status: DC | PRN
Start: 2021-01-23 — End: 2021-01-23

## 2021-01-23 MED ORDER — HEPARIN SODIUM (PORCINE) 1000 UNIT/ML IJ SOLN
INTRAMUSCULAR | Status: DC | PRN
  Administered 2021-01-23: 5000 [IU] via INTRAVENOUS

## 2021-01-23 MED ORDER — FENTANYL CITRATE PF 50 MCG/ML IJ SOSY
PREFILLED_SYRINGE | INTRAMUSCULAR | Status: AC
  Filled 2021-01-23: qty 1

## 2021-01-23 MED ORDER — IODIXANOL 320 MG/ML IV SOLN
INTRAVENOUS | Status: DC | PRN
  Administered 2021-01-23: 40 mL via INTRA_ARTERIAL

## 2021-01-23 MED ORDER — FENTANYL CITRATE (PF) 100 MCG/2ML IJ SOLN
INTRAMUSCULAR | Status: DC | PRN
  Administered 2021-01-23: 50 ug via INTRAVENOUS
  Administered 2021-01-23: 12.5 ug via INTRAVENOUS

## 2021-01-23 MED ORDER — METHYLPREDNISOLONE SODIUM SUCC 125 MG IJ SOLR: 125.0000 mg | Freq: Once | INTRAMUSCULAR | Status: DC | PRN

## 2021-01-23 MED ORDER — MIDAZOLAM HCL 2 MG/2ML IJ SOLN
INTRAMUSCULAR | Status: DC | PRN
  Administered 2021-01-23: 2 mg via INTRAVENOUS
  Administered 2021-01-23: 1 mg via INTRAVENOUS

## 2021-01-23 MED ORDER — FAMOTIDINE 20 MG PO TABS: 40.0000 mg | ORAL_TABLET | Freq: Once | ORAL | Status: DC | PRN

## 2021-01-23 MED ORDER — MIDAZOLAM HCL 5 MG/5ML IJ SOLN
INTRAMUSCULAR | Status: AC
  Filled 2021-01-23: qty 5

## 2021-01-23 MED ORDER — SODIUM CHLORIDE 0.9 % IV SOLN: INTRAVENOUS | Status: DC

## 2021-01-23 SURGICAL SUPPLY — 12 items
CATH ANGIO 5F PIGTAIL 65CM (CATHETERS) ×2 IMPLANT
CATH BEACON 5 .035 65 KMP TIP (CATHETERS) ×2 IMPLANT
COVER PROBE U/S 5X48 (MISCELLANEOUS) ×2 IMPLANT
DEVICE STARCLOSE SE CLOSURE (Vascular Products) ×2 IMPLANT
GLIDEWIRE ADV .035X260CM (WIRE) ×2 IMPLANT
KIT ENCORE 26 ADVANTAGE (KITS) IMPLANT
PACK ANGIOGRAPHY (CUSTOM PROCEDURE TRAY) ×2 IMPLANT
SHEATH ANL2 6FRX45 HC (SHEATH) ×2 IMPLANT
SHEATH BRITE TIP 5FRX11 (SHEATH) ×2 IMPLANT
SYR MEDRAD MARK 7 150ML (SYRINGE) ×2 IMPLANT
TUBING CONTRAST HIGH PRESS 72 (TUBING) ×2 IMPLANT
WIRE GUIDERIGHT .035X150 (WIRE) ×2 IMPLANT

## 2021-01-23 NOTE — Interval H&P Note (Signed)
History and Physical Interval Note:  01/23/2021 8:15 AM  Jonathon Barrera  has presented today for surgery, with the diagnosis of RT Leg Angiography   ASO with claudication   BARD Rep  cc: [name].  The various methods of treatment have been discussed with the patient and family. After consideration of risks, benefits and other options for treatment, the patient has consented to  Procedure(s): LOWER EXTREMITY ANGIOGRAPHY (Right) as a surgical intervention.  The patient's history has been reviewed, patient examined, no change in status, stable for surgery.  I have reviewed the patient's chart and labs.  Questions were answered to the patient's satisfaction.     [name]

## 2021-01-23 NOTE — Op Note (Signed)
[facility]  Percutaneous Study/Intervention Procedural Note   Date of Surgery: 01/23/2021  Surgeon(s):Chelsea Nusz    Assistants:none  Pre-operative Diagnosis: PAD with claudication right lower extremity  Post-operative diagnosis:  Same  Procedure(s) Performed:             1.  Ultrasound guidance for vascular access left femoral artery             2.  Catheter placement into right profunda femoris artery from left femoral approach             3.  Aortogram and selective right lower extremity angiogram             4.  StarClose closure device left femoral artery  EBL: 10 cc  Contrast: 40 cc  Fluoro Time: 11.5 minutes  Moderate Conscious Sedation Time: approximately 37 minutes using 3 mg of Versed and 62.5 mcg of Fentanyl              Indications:  Patient is a 80 y.o.male with fairly disabling claudication symptoms in the right lower extremity with some claudication symptoms on the left as well. The patient has noninvasive study showing reduced ABIs bilaterally worse on the right than the left. The patient is brought in for angiography for further evaluation and potential treatment. Risks and benefits are discussed and informed consent is obtained.   Procedure:  The patient was identified and appropriate procedural time out was performed.  The patient was then placed supine on the table and prepped and draped in the usual sterile fashion. Moderate conscious sedation was administered during a face to face encounter with the patient throughout the procedure with my supervision of the RN administering medicines and monitoring the patient's vital signs, pulse oximetry, telemetry and mental status throughout from the start of the procedure until the patient was taken to the recovery room. Ultrasound was used to evaluate the left common femoral artery.  It was patent .  A digital ultrasound image was acquired.  A Seldinger needle was used to access the left common femoral  artery under direct ultrasound guidance and a permanent image was performed.  A 0.035 J wire was advanced without resistance and a 5Fr sheath was placed.  Pigtail catheter was placed into the aorta and an AP aortogram was performed.  This demonstrated that the renal arteries appeared fairly normal.  The aorta was aneurysmal.  This appeared to be a small to medium size aneurysm.  The iliac arteries were somewhat tortuous but did not appear to have any high-grade stenosis. I then crossed the aortic bifurcation and advanced to the right femoral head.  Circulation time was extremely slow and weak got a catheter into the profunda femoris artery to help opacify distally as the SFA was occluded at its origin.  Selective right lower extremity angiogram was then performed. This demonstrated a flush occlusion of the right SFA with a napkin ringlike lesion in the distal common femoral artery and proximal profunda femoris artery creating some degree of stenosis in the moderate, 50 to 60% range.  The SFA reconstituted in the distal segment.  Opacification distally was very sluggish, but there appeared to be two-vessel runoff with the posterior tibial and peroneal arteries without obvious focal stenosis.  The anterior tibial artery appeared chronically diseased and did not seem to fill the foot.  The patient was systemically heparinized and a 6 [place] Ansell sheath was then placed over the [employer] wire. I then used a Kumpe  catheter and the advantage wire to try to get into the SFA but without even a stump of the SFA as well as with the distal common femoral and proximal profunda femoris disease, we were unable to get into the SFA.  Given this anatomy, I felt his best option was likely a femoral endarterectomy to help clear the lesion and distal common femoral and proximal profunda femoris artery and at that time concomitant attempt to recanalize the SFA can be performed. I elected to terminate the procedure. The sheath  was removed and StarClose closure device was deployed in the left femoral artery with excellent hemostatic result. The patient was taken to the recovery room in stable condition having tolerated the procedure well.  Findings:               Aortogram: This demonstrated that the renal arteries appeared fairly normal.  The aorta was aneurysmal.  This appeared to be a small to medium size aneurysm.  The iliac arteries were somewhat tortuous but did not appear to have any high-grade stenosis.             Right lower Extremity:  This demonstrated a flush occlusion of the right SFA with a napkin ringlike lesion in the distal common femoral artery and proximal profunda femoris artery creating some degree of stenosis in the moderate, 50 to 60% range.  The SFA reconstituted in the distal segment.  Opacification distally was very sluggish, but there appeared to be two-vessel runoff with the posterior tibial and peroneal arteries without obvious focal stenosis.  The anterior tibial artery appeared chronically diseased and did not seem to fill the foot.    Disposition: Patient was taken to the recovery room in stable condition having tolerated the procedure well.  Complications: None  [name] 01/23/2021 8:56 AM   This note was created with Dragon Medical transcription system. Any errors in dictation are purely unintentional.

## 2021-02-04 ENCOUNTER — Telehealth (INDEPENDENT_AMBULATORY_CARE_PROVIDER_SITE_OTHER): Payer: Self-pay | Admitting: Vascular Surgery

## 2021-02-04 NOTE — Telephone Encounter (Signed)
Wife called to check status of surgery. Patient wasn't able to have procedure because of clogged arteries. Patient states that JD advised for patient to have procedure to clear the clogged arteries and then try and do the angio. Please call patient to schedule.

## 2021-02-05 NOTE — Telephone Encounter (Signed)
Per Dr. Bunnie Domino op note, it seems he needs a femoral endarterectomy.  Let's get with Dr. Lucky Cowboy and figure out exactly what surgery is needed, he will then likely need cardiac clearance.  Then once that is done we can get him scheduled.

## 2021-02-06 DIAGNOSIS — G245 Blepharospasm: Secondary | ICD-10-CM | POA: Diagnosis not present

## 2021-02-11 DIAGNOSIS — E7800 Pure hypercholesterolemia, unspecified: Secondary | ICD-10-CM | POA: Diagnosis not present

## 2021-02-11 DIAGNOSIS — Z01810 Encounter for preprocedural cardiovascular examination: Secondary | ICD-10-CM | POA: Diagnosis not present

## 2021-02-11 DIAGNOSIS — J431 Panlobular emphysema: Secondary | ICD-10-CM | POA: Diagnosis not present

## 2021-02-11 DIAGNOSIS — Z720 Tobacco use: Secondary | ICD-10-CM | POA: Diagnosis not present

## 2021-02-11 DIAGNOSIS — I2699 Other pulmonary embolism without acute cor pulmonale: Secondary | ICD-10-CM | POA: Diagnosis not present

## 2021-02-11 DIAGNOSIS — R0602 Shortness of breath: Secondary | ICD-10-CM | POA: Insufficient documentation

## 2021-02-11 DIAGNOSIS — E118 Type 2 diabetes mellitus with unspecified complications: Secondary | ICD-10-CM | POA: Diagnosis not present

## 2021-02-11 DIAGNOSIS — I10 Essential (primary) hypertension: Secondary | ICD-10-CM | POA: Diagnosis not present

## 2021-02-11 DIAGNOSIS — I824Z1 Acute embolism and thrombosis of unspecified deep veins of right distal lower extremity: Secondary | ICD-10-CM | POA: Diagnosis not present

## 2021-02-11 NOTE — Telephone Encounter (Signed)
Patient is being schedule

## 2021-02-12 DIAGNOSIS — R0602 Shortness of breath: Secondary | ICD-10-CM | POA: Diagnosis not present

## 2021-02-12 DIAGNOSIS — Z01810 Encounter for preprocedural cardiovascular examination: Secondary | ICD-10-CM | POA: Diagnosis not present

## 2021-02-18 ENCOUNTER — Telehealth (INDEPENDENT_AMBULATORY_CARE_PROVIDER_SITE_OTHER): Payer: Self-pay

## 2021-02-18 DIAGNOSIS — E118 Type 2 diabetes mellitus with unspecified complications: Secondary | ICD-10-CM | POA: Diagnosis not present

## 2021-02-18 DIAGNOSIS — Z01810 Encounter for preprocedural cardiovascular examination: Secondary | ICD-10-CM | POA: Diagnosis not present

## 2021-02-18 DIAGNOSIS — Z720 Tobacco use: Secondary | ICD-10-CM | POA: Diagnosis not present

## 2021-02-18 DIAGNOSIS — R0602 Shortness of breath: Secondary | ICD-10-CM | POA: Diagnosis not present

## 2021-02-18 DIAGNOSIS — I10 Essential (primary) hypertension: Secondary | ICD-10-CM | POA: Diagnosis not present

## 2021-02-18 DIAGNOSIS — I2699 Other pulmonary embolism without acute cor pulmonale: Secondary | ICD-10-CM | POA: Diagnosis not present

## 2021-02-18 DIAGNOSIS — J431 Panlobular emphysema: Secondary | ICD-10-CM | POA: Diagnosis not present

## 2021-02-18 DIAGNOSIS — I824Z1 Acute embolism and thrombosis of unspecified deep veins of right distal lower extremity: Secondary | ICD-10-CM | POA: Diagnosis not present

## 2021-02-18 DIAGNOSIS — E7800 Pure hypercholesterolemia, unspecified: Secondary | ICD-10-CM | POA: Diagnosis not present

## 2021-02-18 NOTE — Telephone Encounter (Addendum)
I attempted to contact the patient to schedule a right femoral endarterectomy and right SFA stent placement with Dr. Lucky Cowboy. A message was left for a return call. Patient has been scheduled with Dr.Dew for a right femoral endarterectomy and right SFA stent placement on 02/26/21. Pre-op and covid testing is on 02/24/21 at 11:50 and 12:00 pm respectively at the [facility]. Patient was given pre-surgical instructions and it was mailed.

## 2021-02-19 NOTE — Telephone Encounter (Signed)
Patient's wife called wanting to know if the patient should stop his Eliquis before his surgery on 02/26/21. Patient is scheduled for pre-op on 02/24/21 as well. I advised the patient should discontinue Eliquis 2 days prior to his surgery and that his pre-op his medications will be discussed as to what to take or not.

## 2021-02-24 ENCOUNTER — Other Ambulatory Visit
Admission: RE | Admit: 2021-02-24 | Discharge: 2021-02-24 | Disposition: A | Discharge status: Home / Self Care | Payer: PPO | Source: Ambulatory Visit | Attending: Vascular Surgery | Admitting: Vascular Surgery

## 2021-02-24 ENCOUNTER — Other Ambulatory Visit: Payer: Self-pay

## 2021-02-24 ENCOUNTER — Encounter: Payer: Self-pay | Admitting: Vascular Surgery

## 2021-02-24 ENCOUNTER — Encounter
Admission: RE | Admit: 2021-02-24 | Discharge: 2021-02-24 | Disposition: A | Discharge status: Home / Self Care | Payer: PPO | Source: Ambulatory Visit | Attending: Vascular Surgery | Admitting: Vascular Surgery

## 2021-02-24 ENCOUNTER — Other Ambulatory Visit (INDEPENDENT_AMBULATORY_CARE_PROVIDER_SITE_OTHER): Payer: Self-pay | Admitting: Nurse Practitioner

## 2021-02-24 DIAGNOSIS — Z833 Family history of diabetes mellitus: Secondary | ICD-10-CM | POA: Diagnosis not present

## 2021-02-24 DIAGNOSIS — E785 Hyperlipidemia, unspecified: Secondary | ICD-10-CM | POA: Diagnosis present

## 2021-02-24 DIAGNOSIS — I70219 Atherosclerosis of native arteries of extremities with intermittent claudication, unspecified extremity: Secondary | ICD-10-CM | POA: Diagnosis not present

## 2021-02-24 DIAGNOSIS — Z20822 Contact with and (suspected) exposure to covid-19: Secondary | ICD-10-CM

## 2021-02-24 DIAGNOSIS — Z7901 Long term (current) use of anticoagulants: Secondary | ICD-10-CM | POA: Diagnosis not present

## 2021-02-24 DIAGNOSIS — I70211 Atherosclerosis of native arteries of extremities with intermittent claudication, right leg: Secondary | ICD-10-CM

## 2021-02-24 DIAGNOSIS — I70229 Atherosclerosis of native arteries of extremities with rest pain, unspecified extremity: Secondary | ICD-10-CM | POA: Diagnosis not present

## 2021-02-24 DIAGNOSIS — F17210 Nicotine dependence, cigarettes, uncomplicated: Secondary | ICD-10-CM | POA: Diagnosis present

## 2021-02-24 DIAGNOSIS — Z85820 Personal history of malignant melanoma of skin: Secondary | ICD-10-CM | POA: Diagnosis not present

## 2021-02-24 DIAGNOSIS — Z86711 Personal history of pulmonary embolism: Secondary | ICD-10-CM | POA: Diagnosis not present

## 2021-02-24 DIAGNOSIS — E1151 Type 2 diabetes mellitus with diabetic peripheral angiopathy without gangrene: Secondary | ICD-10-CM | POA: Diagnosis present

## 2021-02-24 DIAGNOSIS — Z8249 Family history of ischemic heart disease and other diseases of the circulatory system: Secondary | ICD-10-CM | POA: Diagnosis not present

## 2021-02-24 DIAGNOSIS — J449 Chronic obstructive pulmonary disease, unspecified: Secondary | ICD-10-CM | POA: Diagnosis present

## 2021-02-24 DIAGNOSIS — I10 Essential (primary) hypertension: Secondary | ICD-10-CM | POA: Diagnosis present

## 2021-02-24 DIAGNOSIS — Z01812 Encounter for preprocedural laboratory examination: Secondary | ICD-10-CM | POA: Insufficient documentation

## 2021-02-24 DIAGNOSIS — N400 Enlarged prostate without lower urinary tract symptoms: Secondary | ICD-10-CM | POA: Diagnosis present

## 2021-02-24 DIAGNOSIS — Z86718 Personal history of other venous thrombosis and embolism: Secondary | ICD-10-CM | POA: Diagnosis not present

## 2021-02-24 DIAGNOSIS — I70221 Atherosclerosis of native arteries of extremities with rest pain, right leg: Secondary | ICD-10-CM | POA: Diagnosis present

## 2021-02-24 DIAGNOSIS — Z800 Family history of malignant neoplasm of digestive organs: Secondary | ICD-10-CM | POA: Diagnosis not present

## 2021-02-24 DIAGNOSIS — Z8551 Personal history of malignant neoplasm of bladder: Secondary | ICD-10-CM | POA: Diagnosis not present

## 2021-02-24 HISTORY — DX: Peripheral vascular disease, unspecified: I73.9

## 2021-02-24 LAB — CBC WITH DIFFERENTIAL/PLATELET
Abs Immature Granulocytes: 0.01 K/uL (ref 0.00–0.07)
Basophils Absolute: 0.1 K/uL (ref 0.0–0.1)
Basophils Relative: 2 %
Eosinophils Absolute: 0.2 K/uL (ref 0.0–0.5)
Eosinophils Relative: 5 %
HCT: 35.8 % — ABNORMAL LOW (ref 39.0–52.0)
Hemoglobin: 12.2 g/dL — ABNORMAL LOW (ref 13.0–17.0)
Immature Granulocytes: 0 %
Lymphocytes Relative: 23 %
Lymphs Abs: 0.8 K/uL (ref 0.7–4.0)
MCH: 32.5 pg (ref 26.0–34.0)
MCHC: 34.1 g/dL (ref 30.0–36.0)
MCV: 95.5 fL (ref 80.0–100.0)
Monocytes Absolute: 0.4 K/uL (ref 0.1–1.0)
Monocytes Relative: 12 %
Neutro Abs: 1.9 K/uL (ref 1.7–7.7)
Neutrophils Relative %: 58 %
Platelets: 153 K/uL (ref 150–400)
RBC: 3.75 MIL/uL — ABNORMAL LOW (ref 4.22–5.81)
RDW: 13.7 % (ref 11.5–15.5)
WBC: 3.2 K/uL — ABNORMAL LOW (ref 4.0–10.5)
nRBC: 0 % (ref 0.0–0.2)

## 2021-02-24 LAB — TYPE AND SCREEN
ABO/RH(D): A POS
Antibody Screen: NEGATIVE

## 2021-02-24 NOTE — Patient Instructions (Signed)
Your procedure is scheduled on: 02/26/21 Report to [facility]. To find out your arrival time please call [phone] between 1PM - 3PM on 02/25/21.  Remember: Instructions that are not followed completely may result in serious medical risk, up to and including death, or upon the discretion of your surgeon and anesthesiologist your surgery may need to be rescheduled.     _X__ 1. Do not eat food or drink any liquids after midnight the night before your procedure.                 No gum chewing or hard candies.   __X__2.  On the morning of surgery brush your teeth with toothpaste and water, you                 may rinse your mouth with mouthwash if you wish.  Do not swallow any              toothpaste of mouthwash.     _X__ 3.  No Alcohol for 24 hours before or after surgery.   _X__ 4.  Do Not Smoke or use e-cigarettes For 24 Hours Prior to Your Surgery.                 Do not use any chewable tobacco products for at least 6 hours prior to                 surgery.  ____  5.  Bring all medications with you on the day of surgery if instructed.   __X__  6.  Notify your doctor if there is any change in your medical condition      (cold, fever, infections).     Do not wear jewelry, make-up, hairpins, clips or nail polish. Do not wear lotions, powders, or perfumes.  Do not shave body hair 48 hours prior to surgery. Men may shave face and neck. Do not bring valuables to the hospital.    [facility] is not responsible for any belongings or valuables.  Contacts, dentures/partials or body piercings may not be worn into surgery. Bring a case for your contacts, glasses or hearing aids, a denture cup will be supplied. Leave your suitcase in the car. After surgery it may be brought to your room. For patients admitted to the hospital, discharge time is determined by your treatment team.   Patients discharged the day of surgery will not be  allowed to drive home.   Please read over the following fact sheets that you were given:   MRSA Information, CHG soap  __X__ Take these medicines the morning of surgery with A SIP OF WATER:    1. finasteride (PROSCAR) 5 MG tablet  2.   3.   4.  5.  6.  ____ Fleet Enema (as directed)   __X__ Use CHG Soap/SAGE wipes as directed  ____ Use inhalers on the day of surgery  ____ Stop metformin/Janumet/Farxiga 2 days prior to surgery    ____ Take 1/2 of usual insulin dose the night before surgery. No insulin the morning          of surgery.   __ X_ Hold Eliquis x 4 doses. Last dose Monday 02/24/21 am  __X__ Stop Anti-inflammatories 7 days before surgery such as Advil, Ibuprofen, Motrin,  BC or Goodies Powder, Naprosyn, Naproxen, Aleve, Aspirin   May take Tylenol if needed  __X__ Stop all herbals and supplements, fish oil and vitamins  until after surgery.    ____ Bring C-Pap to the hospital.

## 2021-02-24 NOTE — Pre-Procedure Instructions (Addendum)
Note with instructions for patient to stop his Eliquis 2 days prior to his vascular procedure on 02/26/21/cn    [name], [facility]   Certified Medical Assistant  Other  Telephone Encounter  Signed  Encounter Date:  02/18/2021       Related encounter: Telephone from 02/18/2021 in [facility]       Signed            Patient's wife called wanting to know if the patient should stop his Eliquis before his surgery on 02/26/21. Patient is scheduled for pre-op on 02/24/21 as well. I advised the patient should discontinue Eliquis 2 days prior to his surgery and that his pre-op his medications will be discussed as to what to take or not.         Note Details  Author [name], [place] Time 02/19/2021  9:56 AM  Author Type Certified Medical Assistant Status Signed  Last Editor [name], [facility] Other

## 2021-02-25 LAB — SARS CORONAVIRUS 2 (TAT 6-24 HRS): SARS Coronavirus 2: NEGATIVE

## 2021-02-25 MED ORDER — SODIUM CHLORIDE 0.9 % IV SOLN: INTRAVENOUS | Status: DC

## 2021-02-25 MED ORDER — CEFAZOLIN SODIUM-DEXTROSE 2-4 GM/100ML-% IV SOLN
2.0000 g | INTRAVENOUS | Status: AC
  Administered 2021-02-26: 2 g via INTRAVENOUS

## 2021-02-25 MED ORDER — CHLORHEXIDINE GLUCONATE CLOTH 2 % EX PADS
6.0000 | MEDICATED_PAD | Freq: Once | CUTANEOUS | Status: AC
  Administered 2021-02-25: 6 via TOPICAL

## 2021-02-25 MED ORDER — ORAL CARE MOUTH RINSE: 15.0000 mL | Freq: Once | OROMUCOSAL | Status: AC

## 2021-02-25 MED ORDER — CHLORHEXIDINE GLUCONATE CLOTH 2 % EX PADS
6.0000 | MEDICATED_PAD | Freq: Once | CUTANEOUS | Status: AC
  Administered 2021-02-26: 6 via TOPICAL

## 2021-02-25 MED ORDER — CHLORHEXIDINE GLUCONATE 0.12 % MT SOLN: 15.0000 mL | Freq: Once | OROMUCOSAL | Status: AC

## 2021-02-25 MED ORDER — FAMOTIDINE 20 MG PO TABS
20.0000 mg | ORAL_TABLET | Freq: Once | ORAL | Status: AC
  Administered 2021-02-26: 20 mg via ORAL

## 2021-02-26 ENCOUNTER — Inpatient Hospital Stay: Payer: PPO

## 2021-02-26 ENCOUNTER — Inpatient Hospital Stay: Payer: PPO | Admitting: Urgent Care

## 2021-02-26 ENCOUNTER — Encounter
Admission: RE | Disposition: A | Discharge status: Home / Self Care | Payer: Self-pay | Source: Home / Self Care | Attending: Vascular Surgery

## 2021-02-26 ENCOUNTER — Inpatient Hospital Stay
Admission: RE | Admit: 2021-02-26 | Discharge: 2021-02-27 | DRG: 254 | Disposition: A | Discharge status: Home / Self Care | Payer: PPO | Attending: Vascular Surgery | Admitting: Vascular Surgery

## 2021-02-26 ENCOUNTER — Encounter: Payer: Self-pay | Admitting: Vascular Surgery

## 2021-02-26 ENCOUNTER — Other Ambulatory Visit: Payer: Self-pay

## 2021-02-26 DIAGNOSIS — Z7901 Long term (current) use of anticoagulants: Secondary | ICD-10-CM

## 2021-02-26 DIAGNOSIS — Z20822 Contact with and (suspected) exposure to covid-19: Secondary | ICD-10-CM | POA: Diagnosis present

## 2021-02-26 DIAGNOSIS — I70221 Atherosclerosis of native arteries of extremities with rest pain, right leg: Secondary | ICD-10-CM | POA: Diagnosis present

## 2021-02-26 DIAGNOSIS — J449 Chronic obstructive pulmonary disease, unspecified: Secondary | ICD-10-CM | POA: Diagnosis present

## 2021-02-26 DIAGNOSIS — Z86711 Personal history of pulmonary embolism: Secondary | ICD-10-CM | POA: Diagnosis not present

## 2021-02-26 DIAGNOSIS — I10 Essential (primary) hypertension: Secondary | ICD-10-CM | POA: Diagnosis present

## 2021-02-26 DIAGNOSIS — E785 Hyperlipidemia, unspecified: Secondary | ICD-10-CM | POA: Diagnosis present

## 2021-02-26 DIAGNOSIS — Z800 Family history of malignant neoplasm of digestive organs: Secondary | ICD-10-CM | POA: Diagnosis not present

## 2021-02-26 DIAGNOSIS — Z833 Family history of diabetes mellitus: Secondary | ICD-10-CM | POA: Diagnosis not present

## 2021-02-26 DIAGNOSIS — N400 Enlarged prostate without lower urinary tract symptoms: Secondary | ICD-10-CM | POA: Diagnosis present

## 2021-02-26 DIAGNOSIS — E1151 Type 2 diabetes mellitus with diabetic peripheral angiopathy without gangrene: Secondary | ICD-10-CM | POA: Diagnosis present

## 2021-02-26 DIAGNOSIS — Z85820 Personal history of malignant melanoma of skin: Secondary | ICD-10-CM | POA: Diagnosis not present

## 2021-02-26 DIAGNOSIS — Z8551 Personal history of malignant neoplasm of bladder: Secondary | ICD-10-CM | POA: Diagnosis not present

## 2021-02-26 DIAGNOSIS — Z86718 Personal history of other venous thrombosis and embolism: Secondary | ICD-10-CM

## 2021-02-26 DIAGNOSIS — Z8249 Family history of ischemic heart disease and other diseases of the circulatory system: Secondary | ICD-10-CM

## 2021-02-26 DIAGNOSIS — I70229 Atherosclerosis of native arteries of extremities with rest pain, unspecified extremity: Secondary | ICD-10-CM | POA: Diagnosis present

## 2021-02-26 DIAGNOSIS — F17210 Nicotine dependence, cigarettes, uncomplicated: Secondary | ICD-10-CM | POA: Diagnosis present

## 2021-02-26 DIAGNOSIS — I70211 Atherosclerosis of native arteries of extremities with intermittent claudication, right leg: Secondary | ICD-10-CM | POA: Diagnosis not present

## 2021-02-26 HISTORY — DX: Other forms of dyspnea: R06.09

## 2021-02-26 HISTORY — DX: Long term (current) use of anticoagulants: Z79.01

## 2021-02-26 HISTORY — DX: Atherosclerosis of aorta: I70.0

## 2021-02-26 HISTORY — PX: ENDARTERECTOMY FEMORAL: SHX5804

## 2021-02-26 HISTORY — DX: Unspecified right bundle-branch block: I45.10

## 2021-02-26 HISTORY — DX: Diverticulosis of intestine, part unspecified, without perforation or abscess without bleeding: K57.90

## 2021-02-26 HISTORY — DX: Peripheral vascular disease, unspecified: I73.9

## 2021-02-26 HISTORY — DX: Type 2 diabetes mellitus without complications: E11.9

## 2021-02-26 LAB — BASIC METABOLIC PANEL
Anion gap: 14 (ref 5–15)
BUN: 17 mg/dL (ref 8–23)
CO2: 27 mmol/L (ref 22–32)
Calcium: 8.5 mg/dL — ABNORMAL LOW (ref 8.9–10.3)
Chloride: 94 mmol/L — ABNORMAL LOW (ref 98–111)
Creatinine, Ser: 0.85 mg/dL (ref 0.61–1.24)
GFR, Estimated: >60 mL/min (ref >60)
Glucose, Bld: 114 mg/dL — ABNORMAL HIGH (ref 70–99)
Potassium: 3.2 mmol/L — ABNORMAL LOW (ref 3.5–5.1)
Sodium: 135 mmol/L (ref 135–145)

## 2021-02-26 LAB — GLUCOSE, CAPILLARY
Glucose-Capillary: 155 mg/dL — ABNORMAL HIGH (ref 70–99)
Glucose-Capillary: 318 mg/dL — ABNORMAL HIGH (ref 70–99)
Glucose-Capillary: 272 mg/dL — ABNORMAL HIGH (ref 70–99)

## 2021-02-26 LAB — POCT I-STAT, CHEM 8
BUN: 28 mg/dL — ABNORMAL HIGH (ref 8–23)
Calcium, Ion: 1.21 mmol/L (ref 1.15–1.40)
Chloride: 99 mmol/L (ref 98–111)
Creatinine, Ser: 1.1 mg/dL (ref 0.61–1.24)
Glucose, Bld: 144 mg/dL — ABNORMAL HIGH (ref 70–99)
HCT: 40 % (ref 39.0–52.0)
Hemoglobin: 13.6 g/dL (ref 13.0–17.0)
Potassium: 4.5 mmol/L (ref 3.5–5.1)
Sodium: 138 mmol/L (ref 135–145)
TCO2: 28 mmol/L (ref 22–32)

## 2021-02-26 LAB — CREATININE, SERUM
Creatinine, Ser: 0.89 mg/dL (ref 0.61–1.24)
GFR, Estimated: >60 mL/min (ref >60)

## 2021-02-26 LAB — CBC
HCT: 32.6 % — ABNORMAL LOW (ref 39.0–52.0)
Hemoglobin: 10.9 g/dL — ABNORMAL LOW (ref 13.0–17.0)
MCH: 31.5 pg (ref 26.0–34.0)
MCHC: 33.4 g/dL (ref 30.0–36.0)
MCV: 94.2 fL (ref 80.0–100.0)
Platelets: 118 10*3/uL — ABNORMAL LOW (ref 150–400)
RBC: 3.46 MIL/uL — ABNORMAL LOW (ref 4.22–5.81)
RDW: 14 % (ref 11.5–15.5)
WBC: 4 10*3/uL (ref 4.0–10.5)
nRBC: 0 % (ref 0.0–0.2)

## 2021-02-26 LAB — MRSA NEXT GEN BY PCR, NASAL: MRSA by PCR Next Gen: NOT DETECTED

## 2021-02-26 SURGERY — ENDARTERECTOMY, FEMORAL
Anesthesia: General | Laterality: Right

## 2021-02-26 MED ORDER — ACETAMINOPHEN 10 MG/ML IV SOLN: 1000.0000 mg | Freq: Once | INTRAVENOUS | Status: DC | PRN

## 2021-02-26 MED ORDER — LACTATED RINGERS IV SOLN: INTRAVENOUS | Status: DC | PRN

## 2021-02-26 MED ORDER — ENOXAPARIN SODIUM 30 MG/0.3ML IJ SOSY: 30.0000 mg | PREFILLED_SYRINGE | INTRAMUSCULAR | Status: DC

## 2021-02-26 MED ORDER — NITROGLYCERIN IN D5W 200-5 MCG/ML-% IV SOLN
INTRAVENOUS | Status: AC
  Filled 2021-02-26: qty 250

## 2021-02-26 MED ORDER — IRBESARTAN 150 MG PO TABS
150.0000 mg | ORAL_TABLET | Freq: Every day | ORAL | Status: DC
  Administered 2021-02-26 – 2021-02-27 (×2): 150 mg via ORAL
  Filled 2021-02-26 (×2): qty 1

## 2021-02-26 MED ORDER — FAMOTIDINE 20 MG PO TABS
ORAL_TABLET | ORAL | Status: AC
  Filled 2021-02-26: qty 1

## 2021-02-26 MED ORDER — POTASSIUM CHLORIDE CRYS ER 20 MEQ PO TBCR: 20.0000 meq | EXTENDED_RELEASE_TABLET | Freq: Every day | ORAL | Status: DC | PRN

## 2021-02-26 MED ORDER — ACETAMINOPHEN 325 MG PO TABS: 325.0000 mg | ORAL_TABLET | ORAL | Status: DC | PRN

## 2021-02-26 MED ORDER — ACETAMINOPHEN 10 MG/ML IV SOLN
INTRAVENOUS | Status: AC
  Filled 2021-02-26: qty 100

## 2021-02-26 MED ORDER — FAMOTIDINE IN NACL 20-0.9 MG/50ML-% IV SOLN
20.0000 mg | Freq: Two times a day (BID) | INTRAVENOUS | Status: DC
  Administered 2021-02-26 – 2021-02-27 (×3): 20 mg via INTRAVENOUS
  Filled 2021-02-26 (×3): qty 50

## 2021-02-26 MED ORDER — METOPROLOL TARTRATE 5 MG/5ML IV SOLN: 2.0000 mg | INTRAVENOUS | Status: DC | PRN

## 2021-02-26 MED ORDER — FENTANYL CITRATE (PF) 100 MCG/2ML IJ SOLN: 25.0000 ug | INTRAMUSCULAR | Status: DC | PRN

## 2021-02-26 MED ORDER — OXYCODONE HCL 5 MG PO TABS: 5.0000 mg | ORAL_TABLET | Freq: Once | ORAL | Status: DC | PRN

## 2021-02-26 MED ORDER — HEPARIN 30,000 UNITS/1000 ML (OHS) CELLSAVER SOLUTION
Status: AC
  Filled 2021-02-26: qty 1000

## 2021-02-26 MED ORDER — DEXAMETHASONE SODIUM PHOSPHATE 10 MG/ML IJ SOLN
INTRAMUSCULAR | Status: DC | PRN
  Administered 2021-02-26: 8 mg via INTRAVENOUS

## 2021-02-26 MED ORDER — DEXAMETHASONE SODIUM PHOSPHATE 10 MG/ML IJ SOLN
INTRAMUSCULAR | Status: AC
  Filled 2021-02-26: qty 1

## 2021-02-26 MED ORDER — LIDOCAINE HCL (PF) 2 % IJ SOLN
INTRAMUSCULAR | Status: AC
  Filled 2021-02-26: qty 5

## 2021-02-26 MED ORDER — FINASTERIDE 5 MG PO TABS
5.0000 mg | ORAL_TABLET | Freq: Every day | ORAL | Status: DC
  Administered 2021-02-27: 5 mg via ORAL
  Filled 2021-02-26: qty 1

## 2021-02-26 MED ORDER — FENTANYL CITRATE (PF) 100 MCG/2ML IJ SOLN
INTRAMUSCULAR | Status: AC
  Filled 2021-02-26: qty 2

## 2021-02-26 MED ORDER — ACETAMINOPHEN 650 MG RE SUPP: 325.0000 mg | RECTAL | Status: DC | PRN

## 2021-02-26 MED ORDER — ENOXAPARIN SODIUM 40 MG/0.4ML IJ SOSY
40.0000 mg | PREFILLED_SYRINGE | INTRAMUSCULAR | Status: DC
  Administered 2021-02-27: 40 mg via SUBCUTANEOUS
  Filled 2021-02-26: qty 0.4

## 2021-02-26 MED ORDER — LABETALOL HCL 5 MG/ML IV SOLN: 10.0000 mg | INTRAVENOUS | Status: DC | PRN

## 2021-02-26 MED ORDER — ALUM & MAG HYDROXIDE-SIMETH 200-200-20 MG/5ML PO SUSP: 15.0000 mL | ORAL | Status: DC | PRN

## 2021-02-26 MED ORDER — HYDRALAZINE HCL 20 MG/ML IJ SOLN: 5.0000 mg | INTRAMUSCULAR | Status: DC | PRN

## 2021-02-26 MED ORDER — OXYCODONE-ACETAMINOPHEN 5-325 MG PO TABS: 1.0000 | ORAL_TABLET | ORAL | Status: DC | PRN

## 2021-02-26 MED ORDER — HEPARIN 5000 UNITS IN NS 1000 ML (FLUSH)
INTRAMUSCULAR | Status: DC | PRN
  Administered 2021-02-26: 200 mL via INTRAMUSCULAR

## 2021-02-26 MED ORDER — ONDANSETRON HCL 4 MG/2ML IJ SOLN
INTRAMUSCULAR | Status: AC
  Filled 2021-02-26: qty 2

## 2021-02-26 MED ORDER — FENTANYL CITRATE (PF) 100 MCG/2ML IJ SOLN
INTRAMUSCULAR | Status: DC | PRN
  Administered 2021-02-26: 50 ug via INTRAVENOUS
  Administered 2021-02-26: 25 ug via INTRAVENOUS
  Administered 2021-02-26: 12.5 ug via INTRAVENOUS
  Administered 2021-02-26: 37.5 ug via INTRAVENOUS
  Administered 2021-02-26: 25 ug via INTRAVENOUS

## 2021-02-26 MED ORDER — SODIUM CHLORIDE 0.9 % IV SOLN: INTRAVENOUS | Status: DC

## 2021-02-26 MED ORDER — SORBITOL 70 % SOLN
30.0000 mL | Freq: Every day | Status: DC | PRN
  Filled 2021-02-26: qty 30

## 2021-02-26 MED ORDER — EPHEDRINE 5 MG/ML INJ
INTRAVENOUS | Status: AC
  Filled 2021-02-26: qty 5

## 2021-02-26 MED ORDER — ONDANSETRON HCL 4 MG/2ML IJ SOLN: 4.0000 mg | Freq: Once | INTRAMUSCULAR | Status: DC | PRN

## 2021-02-26 MED ORDER — HYDROCHLOROTHIAZIDE 25 MG PO TABS
25.0000 mg | ORAL_TABLET | Freq: Every day | ORAL | Status: DC
  Administered 2021-02-27: 25 mg via ORAL
  Filled 2021-02-26: qty 1

## 2021-02-26 MED ORDER — EPHEDRINE SULFATE 50 MG/ML IJ SOLN
INTRAMUSCULAR | Status: DC | PRN
  Administered 2021-02-26: 2.5 mg via INTRAVENOUS
  Administered 2021-02-26: 5 mg via INTRAVENOUS
  Administered 2021-02-26: 2.5 mg via INTRAVENOUS

## 2021-02-26 MED ORDER — CEFAZOLIN SODIUM-DEXTROSE 2-4 GM/100ML-% IV SOLN
INTRAVENOUS | Status: AC
  Filled 2021-02-26: qty 100

## 2021-02-26 MED ORDER — HEPARIN SODIUM (PORCINE) 5000 UNIT/ML IJ SOLN
INTRAMUSCULAR | Status: AC
  Filled 2021-02-26: qty 1

## 2021-02-26 MED ORDER — INSULIN ASPART 100 UNIT/ML IJ SOLN
0.0000 [IU] | Freq: Three times a day (TID) | INTRAMUSCULAR | Status: DC
  Administered 2021-02-26: 11 [IU] via SUBCUTANEOUS
  Filled 2021-02-26: qty 1

## 2021-02-26 MED ORDER — OXYCODONE HCL 5 MG/5ML PO SOLN: 5.0000 mg | Freq: Once | ORAL | Status: DC | PRN

## 2021-02-26 MED ORDER — PROPOFOL 10 MG/ML IV BOLUS
INTRAVENOUS | Status: AC
  Filled 2021-02-26: qty 20

## 2021-02-26 MED ORDER — PHENYLEPHRINE HCL (PRESSORS) 10 MG/ML IV SOLN
INTRAVENOUS | Status: AC
  Filled 2021-02-26: qty 1

## 2021-02-26 MED ORDER — SENNOSIDES-DOCUSATE SODIUM 8.6-50 MG PO TABS: 1.0000 | ORAL_TABLET | Freq: Every evening | ORAL | Status: DC | PRN

## 2021-02-26 MED ORDER — LIDOCAINE HCL (CARDIAC) PF 100 MG/5ML IV SOSY
PREFILLED_SYRINGE | INTRAVENOUS | Status: DC | PRN
  Administered 2021-02-26: 100 mg via INTRAVENOUS

## 2021-02-26 MED ORDER — GUAIFENESIN-DM 100-10 MG/5ML PO SYRP: 15.0000 mL | ORAL_SOLUTION | ORAL | Status: DC | PRN

## 2021-02-26 MED ORDER — CHLORHEXIDINE GLUCONATE 0.12 % MT SOLN
OROMUCOSAL | Status: AC
  Administered 2021-02-26: 15 mL via OROMUCOSAL
  Filled 2021-02-26: qty 15

## 2021-02-26 MED ORDER — PHENOL 1.4 % MT LIQD
1.0000 | OROMUCOSAL | Status: DC | PRN
  Administered 2021-02-27: 1 via OROMUCOSAL
  Filled 2021-02-26 (×2): qty 177

## 2021-02-26 MED ORDER — ONDANSETRON HCL 4 MG/2ML IJ SOLN: 4.0000 mg | Freq: Four times a day (QID) | INTRAMUSCULAR | Status: DC | PRN

## 2021-02-26 MED ORDER — DOPAMINE-DEXTROSE 3.2-5 MG/ML-% IV SOLN: 3.0000 ug/kg/min | INTRAVENOUS | Status: DC

## 2021-02-26 MED ORDER — PHENYLEPHRINE HCL (PRESSORS) 10 MG/ML IV SOLN
INTRAVENOUS | Status: DC | PRN
Start: 2021-02-26 — End: 2021-02-26
  Administered 2021-02-26: 100 ug via INTRAVENOUS
  Administered 2021-02-26: 50 ug via INTRAVENOUS
  Administered 2021-02-26 (×2): 100 ug via INTRAVENOUS

## 2021-02-26 MED ORDER — ASPIRIN EC 81 MG PO TBEC
81.0000 mg | DELAYED_RELEASE_TABLET | Freq: Every day | ORAL | Status: DC
  Administered 2021-02-27: 81 mg via ORAL
  Filled 2021-02-26: qty 1

## 2021-02-26 MED ORDER — 0.9 % SODIUM CHLORIDE (POUR BTL) OPTIME
TOPICAL | Status: DC | PRN
  Administered 2021-02-26: 100 mL

## 2021-02-26 MED ORDER — PROPOFOL 10 MG/ML IV BOLUS
INTRAVENOUS | Status: DC | PRN
  Administered 2021-02-26: 150 mg via INTRAVENOUS

## 2021-02-26 MED ORDER — MAGNESIUM SULFATE 2 GM/50ML IV SOLN
2.0000 g | Freq: Every day | INTRAVENOUS | Status: DC | PRN
  Filled 2021-02-26: qty 50

## 2021-02-26 MED ORDER — SOOTHE XP OP SOLN: 1.0000 [drp] | Freq: Three times a day (TID) | OPHTHALMIC | Status: DC | PRN

## 2021-02-26 MED ORDER — SODIUM CHLORIDE 0.9 % IV SOLN: 500.0000 mL | Freq: Once | INTRAVENOUS | Status: DC | PRN

## 2021-02-26 MED ORDER — SUGAMMADEX SODIUM 200 MG/2ML IV SOLN
INTRAVENOUS | Status: DC | PRN
  Administered 2021-02-26: 200 mg via INTRAVENOUS

## 2021-02-26 MED ORDER — DOCUSATE SODIUM 100 MG PO CAPS
100.0000 mg | ORAL_CAPSULE | Freq: Every day | ORAL | Status: DC
  Administered 2021-02-27: 100 mg via ORAL
  Filled 2021-02-26: qty 1

## 2021-02-26 MED ORDER — "VISTASEAL 4 ML SINGLE DOSE KIT "
PACK | CUTANEOUS | Status: DC | PRN
  Administered 2021-02-26: 4 mL via TOPICAL

## 2021-02-26 MED ORDER — CHLORHEXIDINE GLUCONATE CLOTH 2 % EX PADS: 6.0000 | MEDICATED_PAD | Freq: Every day | CUTANEOUS | Status: DC

## 2021-02-26 MED ORDER — ROCURONIUM BROMIDE 100 MG/10ML IV SOLN
INTRAVENOUS | Status: DC | PRN
  Administered 2021-02-26: 10 mg via INTRAVENOUS
  Administered 2021-02-26: 20 mg via INTRAVENOUS
  Administered 2021-02-26: 50 mg via INTRAVENOUS
  Administered 2021-02-26: 20 mg via INTRAVENOUS

## 2021-02-26 MED ORDER — HEMOSTATIC AGENTS (NO CHARGE) OPTIME
TOPICAL | Status: DC | PRN
  Administered 2021-02-26: 1 via TOPICAL

## 2021-02-26 MED ORDER — HEPARIN SODIUM (PORCINE) 1000 UNIT/ML IJ SOLN
INTRAMUSCULAR | Status: DC | PRN
  Administered 2021-02-26: 6000 [IU] via INTRAVENOUS
  Administered 2021-02-26: 2000 [IU] via INTRAVENOUS

## 2021-02-26 MED ORDER — ONDANSETRON HCL 4 MG/2ML IJ SOLN
INTRAMUSCULAR | Status: DC | PRN
  Administered 2021-02-26: 4 mg via INTRAVENOUS

## 2021-02-26 MED ORDER — MORPHINE SULFATE (PF) 2 MG/ML IV SOLN: 2.0000 mg | INTRAVENOUS | Status: DC | PRN

## 2021-02-26 MED ORDER — POLYVINYL ALCOHOL 1.4 % OP SOLN
1.0000 [drp] | OPHTHALMIC | Status: DC | PRN
  Filled 2021-02-26: qty 15

## 2021-02-26 MED ORDER — PHENYLEPHRINE HCL-NACL 20-0.9 MG/250ML-% IV SOLN
INTRAVENOUS | Status: DC | PRN
  Administered 2021-02-26: 30 ug/min via INTRAVENOUS

## 2021-02-26 MED ORDER — ACETAMINOPHEN 10 MG/ML IV SOLN
INTRAVENOUS | Status: DC | PRN
  Administered 2021-02-26: 1000 mg via INTRAVENOUS

## 2021-02-26 MED ORDER — NITROGLYCERIN IN D5W 200-5 MCG/ML-% IV SOLN: 5.0000 ug/min | INTRAVENOUS | Status: DC

## 2021-02-26 MED ORDER — ROCURONIUM BROMIDE 10 MG/ML (PF) SYRINGE
PREFILLED_SYRINGE | INTRAVENOUS | Status: AC
  Filled 2021-02-26: qty 10

## 2021-02-26 MED ORDER — CEFAZOLIN SODIUM-DEXTROSE 2-4 GM/100ML-% IV SOLN
2.0000 g | Freq: Three times a day (TID) | INTRAVENOUS | Status: AC
  Administered 2021-02-26 – 2021-02-27 (×2): 2 g via INTRAVENOUS
  Filled 2021-02-26 (×3): qty 100

## 2021-02-26 MED ORDER — DEXMEDETOMIDINE (PRECEDEX) IN NS 20 MCG/5ML (4 MCG/ML) IV SYRINGE
PREFILLED_SYRINGE | INTRAVENOUS | Status: DC | PRN
  Administered 2021-02-26 (×2): 4 ug via INTRAVENOUS

## 2021-02-26 SURGICAL SUPPLY — 77 items
BAG DECANTER FOR FLEXI CONT (MISCELLANEOUS) ×3 IMPLANT
BALLN LUTONIX 018 4X300X130 (BALLOONS) ×3
BALLN LUTONIX 018 5X300X130 (BALLOONS) ×3
BALLOON LUTONIX 018 4X300X130 (BALLOONS) ×2 IMPLANT
BALLOON LUTONIX 018 5X300X130 (BALLOONS) ×2 IMPLANT
BLADE SURG 15 STRL LF DISP TIS (BLADE) ×2 IMPLANT
BLADE SURG 15 STRL SS (BLADE) ×1
BLADE SURG SZ11 CARB STEEL (BLADE) ×3 IMPLANT
BOOT SUTURE AID YELLOW STND (SUTURE) ×3 IMPLANT
BRUSH SCRUB EZ  4% CHG (MISCELLANEOUS) ×1
BRUSH SCRUB EZ 4% CHG (MISCELLANEOUS) ×2 IMPLANT
CATH BEACON 5 .035 40 KMP TP (CATHETERS) ×2 IMPLANT
CATH BEACON 5 .035 65 KMP TIP (CATHETERS) ×3 IMPLANT
CATH BEACON 5 .038 40 KMP TP (CATHETERS) ×1
CHLORAPREP W/TINT 26 (MISCELLANEOUS) ×3 IMPLANT
DERMABOND ADVANCED (GAUZE/BANDAGES/DRESSINGS) ×1
DERMABOND ADVANCED .7 DNX12 (GAUZE/BANDAGES/DRESSINGS) ×2 IMPLANT
DRAPE INCISE IOBAN 66X45 STRL (DRAPES) ×3 IMPLANT
DRSG OPSITE POSTOP 4X6 (GAUZE/BANDAGES/DRESSINGS) ×3 IMPLANT
ELECT CAUTERY BLADE 6.4 (BLADE) ×3 IMPLANT
ELECT REM PT RETURN 9FT ADLT (ELECTROSURGICAL) ×3
ELECTRODE REM PT RTRN 9FT ADLT (ELECTROSURGICAL) ×2 IMPLANT
GAUZE 4X4 16PLY ~~LOC~~+RFID DBL (SPONGE) ×3 IMPLANT
GLIDEWIRE ADV .035X180CM (WIRE) ×3 IMPLANT
GLOVE SURG SYN 7.0 (GLOVE) ×6 IMPLANT
GLOVE SURG UNDER LTX SZ7.5 (GLOVE) ×3 IMPLANT
GOWN STRL REUS W/ TWL LRG LVL3 (GOWN DISPOSABLE) ×2 IMPLANT
GOWN STRL REUS W/ TWL XL LVL3 (GOWN DISPOSABLE) ×4 IMPLANT
GOWN STRL REUS W/TWL LRG LVL3 (GOWN DISPOSABLE) ×1
GOWN STRL REUS W/TWL XL LVL3 (GOWN DISPOSABLE) ×2
HEMOSTAT SURGICEL 2X3 (HEMOSTASIS) ×3 IMPLANT
IV NS 500ML (IV SOLUTION) ×1
IV NS 500ML BAXH (IV SOLUTION) ×2 IMPLANT
KIT ENCORE 26 ADVANTAGE (KITS) ×3 IMPLANT
KIT TURNOVER KIT A (KITS) ×3 IMPLANT
LABEL OR SOLS (LABEL) ×3 IMPLANT
LOOP RED MAXI  1X406MM (MISCELLANEOUS) ×2
LOOP VESSEL MAXI 1X406 RED (MISCELLANEOUS) ×4 IMPLANT
LOOP VESSEL MINI 0.8X406 BLUE (MISCELLANEOUS) ×4 IMPLANT
LOOPS BLUE MINI 0.8X406MM (MISCELLANEOUS) ×2
MANIFOLD NEPTUNE II (INSTRUMENTS) ×3 IMPLANT
NDL SAFETY ECLIPSE 18X1.5 (NEEDLE) ×2 IMPLANT
NEEDLE HYPO 18GX1.5 SHARP (NEEDLE) ×1
NS IRRIG 500ML POUR BTL (IV SOLUTION) ×3 IMPLANT
PACK ANGIOGRAPHY (CUSTOM PROCEDURE TRAY) ×3 IMPLANT
PACK BASIN MAJOR ARMC (MISCELLANEOUS) ×3 IMPLANT
PACK UNIVERSAL (MISCELLANEOUS) ×3 IMPLANT
PATCH CAROTID ECM VASC 1X10 (Prosthesis & Implant Heart) ×3 IMPLANT
PENCIL ELECTRO HAND CTR (MISCELLANEOUS) IMPLANT
SET WALTER ACTIVATION W/DRAPE (SET/KITS/TRAYS/PACK) ×3 IMPLANT
SHEATH BRITE TIP 5FRX11 (SHEATH) ×3 IMPLANT
SHEATH BRITE TIP 6FRX11 (SHEATH) ×3 IMPLANT
SHEATH BRITE TIP 6FRX5.5 (SHEATH) ×3 IMPLANT
SPONGE T-LAP 18X18 ~~LOC~~+RFID (SPONGE) ×6 IMPLANT
STENT VIABAHN 6X250X120 (Permanent Stent) ×6 IMPLANT
SUT MNCRL 4-0 (SUTURE) ×1
SUT MNCRL 4-0 27XMFL (SUTURE) ×2
SUT PROLENE 5 0 RB 1 DA (SUTURE) ×6 IMPLANT
SUT PROLENE 6 0 BV (SUTURE) ×12 IMPLANT
SUT PROLENE 7 0 BV 1 (SUTURE) ×15 IMPLANT
SUT SILK 2 0 (SUTURE) ×1
SUT SILK 2-0 18XBRD TIE 12 (SUTURE) ×2 IMPLANT
SUT SILK 3 0 (SUTURE) ×1
SUT SILK 3-0 18XBRD TIE 12 (SUTURE) ×2 IMPLANT
SUT SILK 4 0 (SUTURE) ×1
SUT SILK 4-0 18XBRD TIE 12 (SUTURE) ×2 IMPLANT
SUT VIC AB 2-0 CT1 27 (SUTURE) ×2
SUT VIC AB 2-0 CT1 TAPERPNT 27 (SUTURE) ×4 IMPLANT
SUT VIC AB 3-0 SH 27 (SUTURE) ×1
SUT VIC AB 3-0 SH 27X BRD (SUTURE) ×2 IMPLANT
SUT VICRYL+ 3-0 36IN CT-1 (SUTURE) ×6 IMPLANT
SUTURE MNCRL 4-0 27XMF (SUTURE) ×2 IMPLANT
SYR 20ML LL LF (SYRINGE) ×3 IMPLANT
SYR 5ML LL (SYRINGE) ×3 IMPLANT
TRAY FOLEY MTR SLVR 16FR STAT (SET/KITS/TRAYS/PACK) ×3 IMPLANT
WATER STERILE IRR 500ML POUR (IV SOLUTION) ×3 IMPLANT
WIRE G V18X300CM (WIRE) ×3 IMPLANT

## 2021-02-26 NOTE — Op Note (Signed)
OPERATIVE NOTE   PROCEDURE: 1.   Right common femoral, profunda femoris, and superficial femoral artery endarterectomies and patch angioplasty 2.   Right lower extremity angiogram 3.   Viabahn stent placement x2 to the right SFA and popliteal arteries with a pair of 6 mm diameter by 25 cm length Viabahn stents postdilated with 5 mm diameter Lutonix drug-coated angioplasty balloon    PRE-OPERATIVE DIAGNOSIS: 1.Atherosclerotic occlusive disease right lower extremities with claudication   POST-OPERATIVE DIAGNOSIS: Same  SURGEON: [name], MD  CO-surgeon:  Hortencia Pilar, MD  ANESTHESIA:  general  ESTIMATED BLOOD LOSS: 25 cc  FINDING(S): 1.  significant plaque in right common femoral, profunda femoris, and superficial femoral arteries 2.  Right SFA occlusion  SPECIMEN(S):  Right common femoral, profunda femoris, and superficial femoral artery plaque.  INDICATIONS:    Patient presents with disabling claudication symptoms of the right leg.  Right femoral endarterectomy as well as endoluminal therapy to the right SFA and popliteal arteries is planned to try to improve perfusion.  The risks and benefits as well as alternative therapies including intervention were reviewed in detail all questions were answered the patient agrees to proceed with surgery.  DESCRIPTION: After obtaining full informed written consent, the patient was brought back to the operating room and placed supine upon the operating table.  The patient received IV antibiotics prior to induction.  After obtaining adequate anesthesia, the patient was prepped and draped in the standard fashion appropriate time out is called.    Vertical incision was created overlying the right femoral arteries. The common femoral artery proximally, and superficial femoral artery, and primary profunda femoris artery branches were encircled with vessel loops and prepared for control. The right femoral arteries were found to have significant  plaque from the common femoral artery into the profunda and superficial femoral arteries.   6000 units of heparin was given and allowed circulate for 5 minutes.  Additional heparin was given later in the case as needed  Attention is then turned to the right femoral artery.  An arteriotomy is made with 11 blade and extended with Potts scissors in the common femoral artery and carried down onto the first 3-4 cm of the superficial femoral artery. An endarterectomy was then performed. The [facility] was used to create a plane. The proximal endpoint was cut flush with tenotomy scissors. This was in the proximal common femoral artery. An eversion endarterectomy was then performed for the first 2-3 cm of the profunda femoris artery. Good backbleeding was then seen.  A fairly extensive endarterectomy was required in the proximal superficial femoral artery due to the thick tenacious plaque in the flush occlusion.  This extended well beyond the arteriotomy site of the superficial femoral artery by grasping the plaque several centimeters downstream with a hemostat.  The distal endpoint of the superficial femoral artery endarterectomy was created with gentle traction and the distal endpoint was fairly feathered and we will plan to stent up to the distal edge of the endarterectomy site as this was a long chronic occlusion.  The Cormatrix patcth is then selected and prepared for a patch angioplasty.  It is cut and beveled and started at the proximal endpoint with a 6-0 Prolene suture.  Approximately one half of the suture line is run medially and laterally and the distal end point was cut and bevelled to match the arteriotomy.  A second 6-0 Prolene was started at the distal end point and run to the mid portion to complete the arteriotomy.  The vessel was flushed prior to release of control and completion of the anastomosis.  At this point, flow was established first to the profunda femoris artery and then to the  superficial femoral artery.  We then turned our attention to the endoluminal portion of the procedure.  The midportion of the patch was accessed with a Seldinger needle.  A J-wire and 5 French sheath were placed and we would upsized to a 6 [place] sheath.  Using a Kumpe catheter and advantage wire, was able to easily cross the occlusion in the right SFA and popliteal arteries and confirm intraluminal flow in the below-knee popliteal artery.  There is two-vessel runoff distally through the peroneal and posterior tibial arteries.  I then placed a V 18 wire and remove the diagnostic catheter.  2 inflations with a 4 mm diameter by 30 cm length Lutonix drug-coated angioplasty balloon was performed in the right SFA and popliteal artery to the midportion.  I then performed imaging which showed large areas of residual high-grade stenosis throughout the SFA and above-knee popliteal arteries.  A pair of 6 mm diameter by 25 cm length Viabahn stents were then deployed from the popliteal artery just below the knee joint all the way up to the distal endpoint of the SFA endarterectomy.  These were postdilated with 5 mm diameter Lutonix drug-coated balloons with excellent angiographic ablation result and less than 10% residual stenosis.  There is preserved two-vessel runoff distally after intra-arterial nitroglycerin was given and vasospasm was relieved.  The sheath was then removed and a 5-0 Prolene pursestring suture was placed around the exit site.  Surgicel and Vistacel topical hemostatic agents were placed in the femoral incision and hemostasis was complete. The femoral incision was then closed in a layered fashion with 2 layers of 2-0 Vicryl, 2 layers of 3-0 Vicryl, and 4-0 Monocryl for the skin closure. Dermabond and sterile dressing were then placed over the incision.  The patient was then awakened from anesthesia and taken to the recovery room in stable condition having tolerated the procedure well.  COMPLICATIONS:  None  CONDITION: Stable     [name] 02/26/2021 1:06 PM   This note was created with Dragon Medical transcription system. Any errors in dictation are purely unintentional.

## 2021-02-26 NOTE — Anesthesia Postprocedure Evaluation (Signed)
Anesthesia Post Note  Patient: CAPTAIN BLUCHER  Procedure(s) Performed: ENDARTERECTOMY FEMORAL (SFA STENT PLACEMEN) (Right) APPLICATION OF CELL SAVER  Patient location during evaluation: PACU Anesthesia Type: General Level of consciousness: awake and alert, oriented and patient cooperative Pain management: pain level controlled Vital Signs Assessment: post-procedure vital signs reviewed and stable Respiratory status: spontaneous breathing, nonlabored ventilation and respiratory function stable Cardiovascular status: blood pressure returned to baseline and stable Postop Assessment: adequate PO intake Anesthetic complications: no   No notable events documented.   Last Vitals:  Vitals:   02/26/21 1330 02/26/21 1408  BP:  130/74  Pulse: 71 68  Resp: 15 17  Temp: (!) 36.4 C 36.6 C  SpO2: 100% 100%    Last Pain:  Vitals:   02/26/21 1408  TempSrc: Oral  PainSc: 0-No pain                 Darrin Nipper

## 2021-02-26 NOTE — Progress Notes (Signed)
   02/26/21 1430  Clinical Encounter Type  Visited With Patient and family together  Visit Type Initial;Spiritual support;Social support  Referral From Chaplain  Consult/Referral To [employer] visited with PT and family who was at bedside. Chaplain established initial pastoral care, and let family know that there is always a chaplain present. PT was recently brought to the floor from another unit. Chaplain ministered with peaceful present, reflective listening, and hospitality.   Andee Poles, M. Div.

## 2021-02-26 NOTE — H&P (Signed)
[facility] Admission History & Physical  MRN : [id number]  Jonathon TEBBETTS is a 80 y.o. (07/31/1940) male who presents with chief complaint of No chief complaint on file. Marland Kitchen  History of Present Illness: Patient presents today for definitive repair of his peripheral arterial disease causing disabling claudication symptoms of the right lower extremity.  He was found to have severe femoral bifurcation disease on angiogram a few weeks ago.  No new ulcerations or infection.  May have early rest pain but has very disabling claudication symptoms of that right leg.  No major changes since his last visit  Current Facility-Administered Medications  Medication Dose Route Frequency Provider Last Rate Last Admin   0.9 %  sodium chloride infusion   Intravenous Continuous Martha Clan, MD 10 mL/hr at 02/26/21 0809 New Bag at 02/26/21 0809   ceFAZolin (ANCEF) 2-4 GM/100ML-% IVPB            ceFAZolin (ANCEF) IVPB 2g/100 mL premix  2 g Intravenous On Call to OR Kris Hartmann, NP       famotidine (PEPCID) 20 MG tablet             Past Medical History:  Diagnosis Date   Actinic keratosis    Acute deep vein thrombosis (DVT) of distal vein of right lower extremity ([place]) 06/19/2020   Aortic atherosclerosis ([place])    Bladder carcinoma ([place]) 05/26/2007   a.) papillary-transitional cell   BPH (benign prostatic hyperplasia)    Colon polyps    COPD (chronic obstructive pulmonary disease) ([place])    Current use of long term anticoagulation    a.) Apixaban   Diverticulosis    DOE (dyspnea on exertion)    HOH (hard of hearing)    a.) uses BILATERAL assistive devices   Hyperlipemia    Hypertension    Mass    Lt lower cheek   Melanoma ([place]) 2000   Tx by Dr Koleen Nimrod   PAD (peripheral artery disease) [facility])    Peripheral vascular disease ([place])    Pulmonary embolism ([place]) 06/19/2020   a.) RLL; no associated RIGHT heart strain   RBBB (right bundle branch block)    Squamous cell  carcinoma of skin 02/01/2017   Left dorsum latera. hand. EDC   Squamous cell carcinoma of skin 02/01/2017   Left dorsum base of thumb. EDC   T2DM (type 2 diabetes mellitus) ([place])     Past Surgical History:  Procedure Laterality Date   back skin cancer     BLADDER SURGERY     CARDIAC CATHETERIZATION     CATARACT EXTRACTION W/PHACO Right 04/30/2015   Procedure: CATARACT EXTRACTION PHACO AND INTRAOCULAR LENS PLACEMENT ([place]);  Surgeon: Birder Robson, MD;  Location: [facility] ORS;  Service: Ophthalmology;  Laterality: Right;  [place] 01:03 AP% 21.4 CDE  13.57 fluid pack lot # [id number]   CATARACT EXTRACTION W/PHACO Left 05/21/2015   Procedure: CATARACT EXTRACTION PHACO AND INTRAOCULAR LENS PLACEMENT ([facility]);  Surgeon: Birder Robson, MD;  Location: [facility] ORS;  Service: Ophthalmology;  Laterality: Left;  [place] 00:52 AP% 18.9 CDE 9.95 fluid pack lot #[id number]   COLONOSCOPY WITH PROPOFOL N/A 11/05/2014   Procedure: COLONOSCOPY WITH PROPOFOL;  Surgeon: Hulen Luster, MD;  Location: [facility] ENDOSCOPY;  Service: Gastroenterology;  Laterality: N/A;   HERNIA REPAIR     inguinal   KYPHOPLASTY N/A 02/23/2019   Procedure: T12 KYPHOPLASTY;  Surgeon: Hessie Knows, MD;  Location: [facility] ORS;  Service: Orthopedics;  Laterality: N/A;   LESION EXCISION  N/A 02/23/2018   Procedure: EXCISION SCALP CYST AND FACIAL CYST;  Surgeon: Herbert Pun, MD;  Location: [facility] ORS;  Service: General;  Laterality: N/A;   LOWER EXTREMITY ANGIOGRAPHY Right 01/23/2021   Procedure: LOWER EXTREMITY ANGIOGRAPHY;  Surgeon: Algernon Huxley, MD;  Location: [place] [facility];  Service: Cardiovascular;  Laterality: Right;   SHOULDER ARTHROSCOPY W/ ROTATOR CUFF REPAIR Right    TUR-BT       Social History   Tobacco Use   Smoking status: Every Day    Packs/day: 0.50    Types: Cigarettes   Smokeless tobacco: Never  Vaping Use   Vaping Use: Never used  Substance Use Topics   Alcohol use: Yes    Alcohol/week: 6.0 standard drinks    Types: 6  Cans of beer per week   Drug use: No     Family History  Problem Relation Age of Onset   Diabetes Mother    Colon cancer Mother    Diabetes Father     No Known Allergies   REVIEW OF SYSTEMS (Negative unless checked)  Constitutional: [] Weight loss  [] Fever  [] Chills Cardiac: [] Chest pain   [] Chest pressure   [] Palpitations   [] Shortness of breath when laying flat   [] Shortness of breath at rest   [] Shortness of breath with exertion. Vascular:  [x] Pain in legs with walking   [] Pain in legs at rest   [] Pain in legs when laying flat   [x] Claudication   [] Pain in feet when walking  [] Pain in feet at rest  [] Pain in feet when laying flat   [] History of DVT   [] Phlebitis   [] Swelling in legs   [] Varicose veins   [] Non-healing ulcers Pulmonary:   [] Uses home oxygen   [] Productive cough   [] Hemoptysis   [] Wheeze  [] COPD   [] Asthma Neurologic:  [] Dizziness  [] Blackouts   [] Seizures   [] History of stroke   [] History of TIA  [] Aphasia   [] Temporary blindness   [] Dysphagia   [] Weakness or numbness in arms   [] Weakness or numbness in legs Musculoskeletal:  [x] Arthritis   [] Joint swelling   [] Joint pain   [] Low back pain Hematologic:  [] Easy bruising  [] Easy bleeding   [] Hypercoagulable state   [] Anemic  [] Hepatitis Gastrointestinal:  [] Blood in stool   [] Vomiting blood  [] Gastroesophageal reflux/heartburn   [] Difficulty swallowing. Genitourinary:  [] Chronic kidney disease   [] Difficult urination  [] Frequent urination  [] Burning with urination   [] Blood in urine Skin:  [] Rashes   [] Ulcers   [] Wounds Psychological:  [] History of anxiety   []  History of major depression.  Physical Examination  Vitals:   02/26/21 0820  BP: 120/78  Pulse: 89  Resp: 20  Temp: 98.1 F (36.7 C)  TempSrc: Temporal  SpO2: 99%  Weight: 85.3 kg  Height: 5\' 9"  (1.753 m)   Body mass index is 27.76 kg/m. Gen: WD/WN, NAD.  Appears younger than stated age Head: Muse/AT, No temporalis wasting.  Ear/Nose/Throat: Hearing  grossly intact, nares w/o erythema or drainage, oropharynx w/o Erythema/Exudate,  Eyes: Conjunctiva clear, sclera non-icteric Neck: Trachea midline.  No JVD.  Pulmonary:  Good air movement, respirations not labored, no use of accessory muscles.  Cardiac: RRR, normal S1, S2. Vascular:  Vessel Right Left  Radial Palpable Palpable                          PT Trace Palpable 1+ Palpable  DP 1+ Palpable 2+ Palpable   Gastrointestinal:  soft, non-tender/non-distended. No guarding/reflex.  Musculoskeletal: M/S 5/5 throughout.  Extremities without ischemic changes.  No deformity or atrophy.  Neurologic: Sensation grossly intact in extremities.  Symmetrical.  Speech is fluent. Motor exam as listed above. Psychiatric: Judgment intact, Mood & affect appropriate for pt's clinical situation. Dermatologic: No rashes or ulcers noted.  No cellulitis or open wounds. Lymph : No Cervical, Axillary, or Inguinal lymphadenopathy.     CBC Lab Results  Component Value Date   WBC 3.2 (L) 02/24/2021   HGB 13.6 02/26/2021   HCT 40.0 02/26/2021   MCV 95.5 02/24/2021   PLT 153 02/24/2021    BMET    Component Value Date/Time   NA 138 02/26/2021 0812   K 4.5 02/26/2021 0812   K 3.9 02/23/2012 0928   CL 99 02/26/2021 0812   CO2 27 02/24/2021 1317   GLUCOSE 144 (H) 02/26/2021 0812   BUN 28 (H) 02/26/2021 0812   CREATININE 1.10 02/26/2021 0812   CALCIUM 8.5 (L) 02/24/2021 1317   GFRNONAA >60 02/24/2021 1317   Estimated Creatinine Clearance: 59.9 mL/min (by C-G formula based on SCr of 1.1 mg/dL).  COAG No results found for: INR, PROTIME  Radiology No results found.   Assessment/Plan 1.  PAD with claudication right lower extremity.  For right femoral endarterectomy, planned right SFA stents as well.  Will need an extensive endarterectomy including the origins of the SFA and profunda femoris arteries as well.  Risks and benefits are discussed.  Patient agreeable proceed. 2.  DVT.  Had this in  the right leg earlier this year.  May also be contributing to some of the swelling and pain in the right leg. 3.  Diabetes.  Stable on outpatient medications and blood glucose control important in reducing the progression of atherosclerotic disease. Also, involved in wound healing. On appropriate medications.    [name], MD  02/26/2021 8:55 AM

## 2021-02-26 NOTE — Anesthesia Procedure Notes (Signed)
Procedure Name: Intubation Date/Time: 02/26/2021 10:00 AM Performed by: Rolla Plate, CRNA Pre-anesthesia Checklist: Patient identified, Patient being monitored, Timeout performed, Emergency Drugs available and Suction available Patient Re-evaluated:Patient Re-evaluated prior to induction Oxygen Delivery Method: Circle system utilized Preoxygenation: Pre-oxygenation with 100% oxygen Induction Type: IV induction Ventilation: Mask ventilation without difficulty Laryngoscope Size: McGraph and 4 Grade View: Grade I Tube type: Oral Tube size: 7.5 mm Number of attempts: 1 Airway Equipment and Method: Bougie stylet and Video-laryngoscopy Placement Confirmation: ETT inserted through vocal cords under direct vision, positive ETCO2 and breath sounds checked- equal and bilateral Secured at: 22 cm Tube secured with: Tape Dental Injury: Teeth and Oropharynx as per pre-operative assessment

## 2021-02-26 NOTE — Anesthesia Preprocedure Evaluation (Signed)
Anesthesia Evaluation  Patient identified by MRN, date of birth, ID band Patient awake    Reviewed: Allergy & Precautions, NPO status , Patient's Chart, lab work & pertinent test results  History of Anesthesia Complications Negative for: history of anesthetic complications  Airway Mallampati: III  TM Distance: >3 FB Neck ROM: Limited    Dental  (+) Lower Dentures, Upper Dentures   Pulmonary neg sleep apnea, COPD, Current SmokerPatient did not abstain from smoking.,    Pulmonary exam normal breath sounds clear to auscultation       Cardiovascular Exercise Tolerance: Good METShypertension, Pt. on medications + Peripheral Vascular Disease  (-) CAD and (-) Past MI Normal cardiovascular exam(-) dysrhythmias  Rhythm:Regular Rate:Normal  Normal stress echo 2022   Neuro/Psych negative neurological ROS  negative psych ROS   GI/Hepatic negative GI ROS, Neg liver ROS, neg GERD  ,  Endo/Other  diabetes, Well Controlled, Type 2  Renal/GU negative Renal ROS Bladder dysfunction  Bladder CA Hx    Musculoskeletal negative musculoskeletal ROS (+)   Abdominal Normal abdominal exam  (+)   Peds negative pediatric ROS (+)  Hematology negative hematology ROS (+)   Anesthesia Other Findings Past Medical History: No date: Bladder carcinoma (HCC) No date: BPH (benign prostatic hyperplasia) No date: Cancer ([place])     Comment:  MELANOMA No date: Colon polyps No date: COPD (chronic obstructive pulmonary disease) (HCC) No date: Diabetes mellitus without complication (HCC) No date: HOH (hard of hearing)     Comment:  AIDS No date: Hyperlipemia No date: Hypertension No date: Mass     Comment:  Lt lower cheek No date: Skin sore  Reproductive/Obstetrics                             Anesthesia Physical  Anesthesia Plan  ASA: 3  Anesthesia Plan: General   Post-op Pain Management:    Induction:  Intravenous  PONV Risk Score and Plan: 1 and Ondansetron, Dexamethasone and Treatment may vary due to age or medical condition  Airway Management Planned: Oral ETT  Additional Equipment: Arterial line  Intra-op Plan:   Post-operative Plan: Extubation in OR  Informed Consent: I have reviewed the patients History and Physical, chart, labs and discussed the procedure including the risks, benefits and alternatives for the proposed anesthesia with the patient or authorized representative who has indicated his/her understanding and acceptance.     Dental advisory given  Plan Discussed with: CRNA and Surgeon  Anesthesia Plan Comments: (Discussed risks of anesthesia with patient, including PONV, sore throat, lip/dental damage, bleeding requiring blood transfusion. Rare risks discussed as well, such as cardiorespiratory and neurological sequelae, and allergic reactions. Discussed the role of CRNA in patient's perioperative care. Plan for arterial line and large bore IV. Patient understands. Patient counseled on benefits of smoking cessation, and increased perioperative risks associated with continued smoking. )        Anesthesia Quick Evaluation

## 2021-02-26 NOTE — Op Note (Signed)
OPERATIVE NOTE   PROCEDURE: Right common femoral, superficial femoral and profunda femoris endarterectomy with Cormatrix patch angioplasty. Open angioplasty and stent placement right superficial femoral and popliteal arteries using 6 mm diameter Viabahn stents  PRE-OPERATIVE DIAGNOSIS: Atherosclerotic occlusive disease right lower extremity with lifestyle limiting claudication and rest pain symptoms; hypertension; diabetes mellitus  POST-OPERATIVE DIAGNOSIS: Same  CO-SURGEON: Katha Cabal, MD and Algernon Huxley, M.D.  ASSISTANT(S): None  ANESTHESIA: general  ESTIMATED BLOOD LOSS: 25 cc  FINDING(S): Profound calcific plaque noted of the right common femoral extending into the profunda femoris arteries as well as down the extensive length of the SFA and popliteal arteries  SPECIMEN(S):  Calcific plaque from the common femoral, superficial femoral and the profunda femoris artery for permanent section  INDICATIONS:   Jonathon Barrera 80 y.o. y.o.male who presents with complaints of lifestyle limiting claudication and pain continuously in the right lower extremity. The patient has documented severe atherosclerotic occlusive disease and has undergone minimally invasive treatments in the past. However, at this point his primary area of stricture stenosis resides in the common femoral and origins of the superficial femoral and profunda femoris extending into these arteries and therefore this is not amenable to intervention. The patient is therefore undergoing open endarterectomy. The risks and benefits of surgery have been reviewed with the patient, all questions have answered; alternative therapies have been reviewed as well and the patient has agreed to proceed with surgical open repair.  DESCRIPTION: After obtaining full informed written consent, the patient was brought back to the operating room and placed supine upon the operating table.  The  patient received IV antibiotics prior to induction.  After obtaining adequate anesthesia, the patient was prepped and draped in the standard fashion for right femoral exposure.  Co-surgeons are required because this is a complicated procedure with work being performed simultaneously from both the patient's right left sides.  This also expedites the procedure making a shorter operative time reducing complications and improving patient safety.  Attention was turned to the right groin with Dr. Lucky Cowboy working on the patient's right and myself working on the left of the patient.  Vertical  Incision was made over the right common femoral artery and dissection carried down to the common femoral artery with electrocautery.  I dissected out the common femoral artery from the distal external iliac artery (identified by the superficial circumflex vessels) down to the femoral bifurcation.  On initial inspection, the common femoral artery was: densely calcified and there was no palpable pulse noted.    Subsequently the dissection was continued to include all circumflex branches and the profunda femoral artery and superficial femoral artery. The superficial femoral artery was dissected circumferentially for a distance of approximately 3-4 cm and the profunda femoris was dissected circumferentially out to the fourth order branches individual vessel loops were placed around each branch.  Control of all branches was obtained with vessel loops.  A softer area in the distal external iliac artery amendable to clamping was identified.    The patient was given 5000 units of Heparin intravenously, which was a therapeutic bolus.   After waiting 3 minutes, the distal external iliac artery was clamped and all of the vessel loops were placed under tension.  Arteriotomy was made in the common femoral artery with a 11-blade and extended it with a Potts scissor proximally and distally extending the distal end down the SFA for approximately 3  cm.   Endarterectomy was then performed under direct visualization  using a freer elevator and a right angle from the mid common femoral extending up both proximally and distally. Proximally the endarterectomy was brought up to the level of the clamp where a clean edge was obtained. Distally the endarterectomy was carried down to a soft spot in the SFA where a feathered edge would was obtained.    The profunda femoris was treated with an eversion technique extending endarterectomy approximately 1 cm distally again obtaining a featheredge on the right side.  7-0 Prolene interrupted tacking sutures were placed to secure the leading edge of the plaque in the profunda femoris.  A total of 5 interrupted tacking sutures were utilized.  At this point, a corematrix patch was fashioned for the geometry of the arteriotomy.  The patch was sewn to the artery with 2 running stitches of 6-0 Prolene, running from each end.  Prior to completing the patch angioplasty, the profunda femoral artery was flushed as was the superficial femoral artery. The system was then forward flushed. The endarterectomy site was then irrigated copiously with heparinized saline. The patch angioplasty was completed in the usual fashion.  Flow was then reestablished first to the profunda femoris and then the superficial femoral artery. Any gaps or bleeding sites in the suture line were easily controlled with a 6-0 Prolene suture.   At this point we began the interventional portion of the surgery for which Dr. Lucky Cowboy is the primary and I am the first assistant.  Please refer to Dr. Bunnie Domino note for any specific details.  Access was obtained to the superficial femoral artery by inserting a Seldinger needle through the center of the patch and negotiating the wire into the superficial femoral artery.  Initially a 5 French sheath was placed and this was later upsized to a 6 [place] sheath.  With a combination of a 0.014 advantage wire and a Kumpe catheter Dr.  Lucky Cowboy was able to cross the SFA and popliteal occlusion.  Hand-injection contrast demonstrated intraluminal positioning in the distal popliteal.  There was two-vessel runoff consistent with previous angiography.  A V 18 wire was then advanced through the Kumpe catheter.  Initial attempts at advancing the Viabahn stent were unsuccessful secondary to the stenosis and a 4 mm balloon was used to predilate the lesion in its entirety.  Then 6 mm x 250 mm Viabahn stents were deployed with the first deployed distally.  The distal edge was located a level in the distal popliteal just below the tibial plateau.  The second stent was then extended so that the leading edge was within the portion of the SFA that had been successfully treated with endarterectomy.  The 2 stents were then postdilated with a 5 mm balloon serial inflations were performed.  Follow-up imaging demonstrated the stents to be widely patent with less than 10% residual stenosis.  There is preservation of the two-vessel distal runoff.  At this point we elected to end the case wires and catheters were removed.  The sheath was removed after a pursestring suture of 5-0 Prolene was placed to control the bleeding.  The right groin was then irrigated copiously with sterile saline and subsequently Vistaseal and Surgicel were placed in the wound. The incision was repaired with a double layer of 2-0 Vicryl, a double layer of 3-0 Vicryl, and a layer of 4-0 Monocryl in a subcuticular fashion.  The skin was cleaned, dried, and reinforced with Dermabond.  COMPLICATIONS: None  CONDITION: Jonathon Barrera, M.D.  [facility] Office: [phone]  02/26/2021, 12:48 PM

## 2021-02-26 NOTE — Anesthesia Procedure Notes (Signed)
Arterial Line Insertion Start/End11/12/2020 10:00 AM, 02/26/2021 10:10 AM Performed by: Darrin Nipper, MD, Rolla Plate, CRNA, CRNA  Patient location: OR. Preanesthetic checklist: patient identified, IV checked, site marked, risks and benefits discussed, surgical consent, monitors and equipment checked, pre-op evaluation, timeout performed and anesthesia consent Patient sedated Left, radial was placed Catheter size: 20 G Hand hygiene performed , maximum sterile barriers used  and Seldinger technique used Allen's test indicative of satisfactory collateral circulation Attempts: 2 Procedure performed without using ultrasound guided technique. Following insertion, dressing applied. Post procedure assessment: normal and unchanged  Patient tolerated the procedure well with no immediate complications.

## 2021-02-26 NOTE — Transfer of Care (Addendum)
Immediate Anesthesia Transfer of Care Note  Patient: Jonathon Barrera  Procedure(s) Performed: ENDARTERECTOMY FEMORAL (SFA STENT PLACEMEN) (Right) APPLICATION OF CELL SAVER  Patient Location: PACU  Anesthesia Type:General  Level of Consciousness: sedated  Airway & Oxygen Therapy: Patient Spontanous Breathing and Patient connected to face mask oxygen  Post-op Assessment: Report given to RN and Post -op Vital signs reviewed and stable  Post vital signs: Reviewed  Last Vitals:  Vitals Value Taken Time  BP    Temp    Pulse 74 02/26/21 1259  Resp 15 02/26/21 1259  SpO2 100 % 02/26/21 1259  Vitals shown include unvalidated device data.  Last Pain:  Vitals:   02/26/21 0820  TempSrc: Temporal  PainSc: 0-No pain      Patients Stated Pain Goal: 0 (26/41/58 3094)  Complications: No notable events documented.

## 2021-02-27 ENCOUNTER — Encounter: Payer: Self-pay | Admitting: Vascular Surgery

## 2021-02-27 DIAGNOSIS — I70229 Atherosclerosis of native arteries of extremities with rest pain, unspecified extremity: Secondary | ICD-10-CM

## 2021-02-27 LAB — BASIC METABOLIC PANEL
Anion gap: 5 (ref 5–15)
BUN: 19 mg/dL (ref 8–23)
CO2: 23 mmol/L (ref 22–32)
Calcium: 8 mg/dL — ABNORMAL LOW (ref 8.9–10.3)
Chloride: 107 mmol/L (ref 98–111)
Creatinine, Ser: 1.01 mg/dL (ref 0.61–1.24)
GFR, Estimated: >60 mL/min (ref >60)
Glucose, Bld: 215 mg/dL — ABNORMAL HIGH (ref 70–99)
Potassium: 3.8 mmol/L (ref 3.5–5.1)
Sodium: 135 mmol/L (ref 135–145)

## 2021-02-27 LAB — HEMOGLOBIN A1C
Hgb A1c MFr Bld: 7.7 % — ABNORMAL HIGH (ref 4.8–5.6)
Mean Plasma Glucose: 174.29 mg/dL

## 2021-02-27 LAB — CBC
HCT: 30.2 % — ABNORMAL LOW (ref 39.0–52.0)
Hemoglobin: 10 g/dL — ABNORMAL LOW (ref 13.0–17.0)
MCH: 31.3 pg (ref 26.0–34.0)
MCHC: 33.1 g/dL (ref 30.0–36.0)
MCV: 94.4 fL (ref 80.0–100.0)
Platelets: 115 10*3/uL — ABNORMAL LOW (ref 150–400)
RBC: 3.2 MIL/uL — ABNORMAL LOW (ref 4.22–5.81)
RDW: 13.7 % (ref 11.5–15.5)
WBC: 4.9 10*3/uL (ref 4.0–10.5)
nRBC: 0 % (ref 0.0–0.2)

## 2021-02-27 LAB — GLUCOSE, CAPILLARY: Glucose-Capillary: 115 mg/dL — ABNORMAL HIGH (ref 70–99)

## 2021-02-27 LAB — SURGICAL PATHOLOGY

## 2021-02-27 MED ORDER — OXYCODONE-ACETAMINOPHEN 5-325 MG PO TABS: 1.0000 | ORAL_TABLET | Freq: Four times a day (QID) | ORAL | 0 refills | Status: DC | PRN

## 2021-02-27 MED ORDER — ASPIRIN 81 MG PO TBEC: 81.0000 mg | DELAYED_RELEASE_TABLET | Freq: Every day | ORAL | 3 refills | Status: DC

## 2021-02-27 MED ORDER — APIXABAN 5 MG PO TABS
5.0000 mg | ORAL_TABLET | Freq: Two times a day (BID) | ORAL | Status: DC
  Administered 2021-02-27: 5 mg via ORAL
  Filled 2021-02-27: qty 1

## 2021-02-27 NOTE — Discharge Instructions (Signed)
Vascular Surgery Discharge Instructions:  1) You may remove your dressing on Saturday and shower. Gently clean your incision with soap and water. Gently pat dry. 2) The purple tinted glue on top of your incision will slowly flake off on its own over the next week or two. Please do not remove it. 3) Please do not engage in strenuous activity or lifting greater than 10 pounds until you are cleared at your first operative follow-up. 4) Please do not drive for at least 2 weeks.

## 2021-02-27 NOTE — Discharge Summary (Signed)
[facility]    Discharge Summary  Patient ID:  Jonathon Barrera MRN: [id number] DOB/AGE: 1940/06/10 80 y.o.  Admit date: 02/26/2021 Discharge date: 02/27/2021 Date of Surgery: 02/26/2021 Surgeon: Surgeon(s): Dew, Erskine Squibb, MD Schnier, Dolores Lory, MD  Admission Diagnosis: Atherosclerosis of artery of extremity with rest pain [facility]) [I70.229]  Discharge Diagnoses:  Atherosclerosis of artery of extremity with rest pain [facility]) [I70.229]  Secondary Diagnoses: Past Medical History:  Diagnosis Date   Actinic keratosis    Acute deep vein thrombosis (DVT) of distal vein of right lower extremity ([place]) 06/19/2020   Aortic atherosclerosis (HCC)    Bladder carcinoma ([place]) 05/26/2007   a.) papillary-transitional cell   BPH (benign prostatic hyperplasia)    Colon polyps    COPD (chronic obstructive pulmonary disease) ([place])    Current use of long term anticoagulation    a.) Apixaban   Diverticulosis    DOE (dyspnea on exertion)    HOH (hard of hearing)    a.) uses BILATERAL assistive devices   Hyperlipemia    Hypertension    Mass    Lt lower cheek   Melanoma ([place]) 2000   Tx by Dr Koleen Nimrod   PAD (peripheral artery disease) [facility])    Peripheral vascular disease ([place])    Pulmonary embolism ([place]) 06/19/2020   a.) RLL; no associated RIGHT heart strain   RBBB (right bundle branch block)    Squamous cell carcinoma of skin 02/01/2017   Left dorsum latera. hand. EDC   Squamous cell carcinoma of skin 02/01/2017   Left dorsum base of thumb. EDC   T2DM (type 2 diabetes mellitus) ([place])    Procedure(s): 02/26/21: 1.   Right common femoral, profunda femoris, and superficial femoral artery endarterectomies and patch angioplasty 2.   Right lower extremity angiogram 3.   Viabahn stent placement x2 to the right SFA and popliteal arteries with a pair of 6 mm diameter by 25 cm length Viabahn stents postdilated with 5 mm diameter Lutonix drug-coated angioplasty  balloon  Discharged Condition: Good  HPI / Hospital Course:  Patient presents with disabling claudication symptoms of the right leg.  Right femoral endarterectomy as well as endoluminal therapy to the right SFA and popliteal arteries is planned to try to improve perfusion.  The risks and benefits as well as alternative therapies including intervention were reviewed in detail all questions were answered the patient agrees to proceed with surgery. On 02/26/21, the patient underwent:  1.   Right common femoral, profunda femoris, and superficial femoral artery endarterectomies and patch angioplasty 2.   Right lower extremity angiogram 3.   Viabahn stent placement x2 to the right SFA and popliteal arteries with a pair of 6 mm diameter by 25 cm length Viabahn stents postdilated with 5 mm diameter Lutonix drug-coated angioplasty balloon  The patient tolerated the procedure well was transferred from the operating room to the ICU for observation overnight.  Patient's site of surgery was unremarkable.  During his brief stay, the patient's diet was advanced, his discomfort was controlled to the use of p.o. pain medication, after his Foley was removed he was urinating independently and he was ambulating at baseline.  Patient was evaluated by physical therapy who deemed it safe for him to be discharged home without home services.  Day of discharge, the patient was afebrile with stable vital signs and improvement in his physical exam.  Physical Exam:  Alert and oriented x3, no acute distress Cardiovascular: Regular rate and rhythm Pulmonary: Clear  to station bilaterally Abdomen: Soft, nontender, nondistended Right groin:   Incision: Dermabond intact.  Clean and dry.  No swelling or drainage. Extremity:   Right: Thigh soft.  Calf soft.  Extremities warm distally toes.  Minimal edema.  Warm./Sensory is intact.  Labs: As below  Complications: None  Consults: None  Significant Diagnostic Studies: CBC Lab  Results  Component Value Date   WBC 4.9 02/27/2021   HGB 10.0 (L) 02/27/2021   HCT 30.2 (L) 02/27/2021   MCV 94.4 02/27/2021   PLT 115 (L) 02/27/2021   BMET    Component Value Date/Time   NA 135 02/27/2021 0041   K 3.8 02/27/2021 0041   K 3.9 02/23/2012 0928   CL 107 02/27/2021 0041   CO2 23 02/27/2021 0041   GLUCOSE 215 (H) 02/27/2021 0041   BUN 19 02/27/2021 0041   CREATININE 1.01 02/27/2021 0041   CALCIUM 8.0 (L) 02/27/2021 0041   GFRNONAA >60 02/27/2021 0041   COAG No results found for: INR, PROTIME  Disposition:  Discharge to :Home  Allergies as of 02/27/2021   No Known Allergies      Medication List     TAKE these medications    aspirin 81 MG EC tablet Take 1 tablet (81 mg total) by mouth daily at 6 (six) AM. Swallow whole. Start taking on: February 28, 2021   Eliquis 5 MG Tabs tablet Generic drug: apixaban Take 5 mg by mouth 2 (two) times daily.   finasteride 5 MG tablet Commonly known as: PROSCAR Take 5 mg by mouth daily.   glimepiride 4 MG tablet Commonly known as: AMARYL Take 4 mg by mouth in the morning and at bedtime.   hydrochlorothiazide 25 MG tablet Commonly known as: HYDRODIURIL Take 25 mg by mouth daily.   oxyCODONE-acetaminophen 5-325 MG tablet Commonly known as: PERCOCET/ROXICET Take 1 tablet by mouth every 6 (six) hours as needed for moderate pain or severe pain.   Soothe XP Soln Place 1 drop into both eyes 3 (three) times daily as needed (dry eyes).   telmisartan 80 MG tablet Commonly known as: MICARDIS Take 80 mg by mouth daily.   URINOZINC PLUS PO Take 1 tablet by mouth 2 (two) times daily.       Verbal and written Discharge instructions given to the patient. Wound care per Discharge AVS  Follow-up Information     Kris Hartmann, NP Follow up in 2 week(s).   Specialty: Vascular Surgery Why: First post-op visit. Incision check. Contact information: [place] [address] [phone]                 Signed: Sela Hua, PA-C 02/27/2021, 1:06 PM

## 2021-02-27 NOTE — Plan of Care (Signed)
  Problem: Clinical Measurements: Goal: Ability to maintain clinical measurements within normal limits will improve Outcome: Progressing Goal: Will remain free from infection Outcome: Progressing Goal: Diagnostic test results will improve Outcome: Progressing Goal: Cardiovascular complication will be avoided Outcome: Progressing   Problem: Pain Managment: Goal: General experience of comfort will improve Outcome: Progressing

## 2021-02-27 NOTE — Evaluation (Signed)
Physical Therapy Evaluation Patient Details Name: Jonathon Barrera MRN: [id number] DOB: 1940-10-07 Today's Date: 02/27/2021  History of Present Illness  Jonathon Barrera is a 80 y.o male  who presents for definitive repair of his peripheral arterial disease causing disabling claudication symptoms of the right lower extremity. 02/26/21 Underwent R common femoral endarterectomy with angioplasty and stent placement.   Clinical Impression  Pt is a pleasant 80 year old male who presents to PT evaluation s/p day #1 R endartectomy with angioplasty + stent placement. Pt reports prior to admission he was independent without assistive device usage and lives with his spouse who is available 24/7 for assistance and supervision. Upon evaluation, pt able to complete sit to stand transfer with SPV from recliner chair and ambulated 150 ft with RW and SBA/CGA for safety. Recommending supervision/assistance for OOB mobility at discharge with spouse and usage of walker that patient already owns. Verbally instructed patient and spouse on safety with stair climbing into the home. No further PT needs identified at this time and will complete orders.      Recommendations for follow up therapy are one component of a multi-disciplinary discharge planning process, led by the attending physician.  Recommendations may be updated based on patient status, additional functional criteria and insurance authorization.  Follow Up Recommendations No PT follow up    Assistance Recommended at Discharge Intermittent Supervision/Assistance  Functional Status Assessment Patient has not had a recent decline in their functional status  Equipment Recommendations  None recommended by PT    Recommendations for Other Services       Precautions / Restrictions Precautions Precautions: Fall Restrictions Weight Bearing Restrictions: Yes RLE Weight Bearing: Weight bearing as tolerated      Mobility  Bed Mobility                General bed mobility comments: Pt received in recliner chair    Transfers Overall transfer level: Needs assistance Equipment used: Rolling walker (2 wheels) Transfers: Sit to/from Stand Sit to Stand: Supervision           General transfer comment: Able to stand from recliner chair with usage of UEs. No AD at initial standing but able to stabilize without assistance with initial standing    Ambulation/Gait Ambulation/Gait assistance: Min guard Gait Distance (Feet): 150 Feet Assistive device: Rolling walker (2 wheels) Gait Pattern/deviations: WFL(Within Functional Limits);Decreased stride length Gait velocity: slightly decreased     General Gait Details: Demonstrating near normal gait pattern with usage of RW. Slightly decreased stride length likely due to incisional discomfort on R LE  Stairs            Wheelchair Mobility    Modified Rankin (Stroke Patients Only)       Balance Overall balance assessment: Needs assistance Sitting-balance support: No upper extremity supported;Feet supported Sitting balance-Leahy Scale: Normal     Standing balance support: Bilateral upper extremity supported;During functional activity Standing balance-Leahy Scale: Good Standing balance comment: Good static standing balance, Fair dynamic standing balance without UE support on device     Tandem Stance - Right Leg: 15 Tandem Stance - Left Leg: 15       High Level Balance Comments: Feet together - no increased sway throughout static balance assessment             Pertinent Vitals/Pain Pain Assessment: No/denies pain    Home Living Family/patient expects to be discharged to:: Private residence Living Arrangements: Spouse/significant other Available Help at Discharge: Family;Available 24 hours/day Type  of Home: House Home Access: Stairs to enter Entrance Stairs-Rails: Left Entrance Stairs-Number of Steps: 3   Home Layout: One level Home Equipment: Standard  Walker;Cane - single point Additional Comments: Pt reports living with spouse who is able to provide 24/7 assistance.    Prior Function Prior Level of Function : Independent/Modified Independent             Mobility Comments: IND with all ADLs and ambulated without any assistive devices prior to admission       Hand Dominance        Extremity/Trunk Assessment   Upper Extremity Assessment Upper Extremity Assessment: Overall WFL for tasks assessed    Lower Extremity Assessment Lower Extremity Assessment: Overall WFL for tasks assessed       Communication   Communication: No difficulties  Cognition Arousal/Alertness: Awake/alert Behavior During Therapy: WFL for tasks assessed/performed Overall Cognitive Status: Within Functional Limits for tasks assessed                                          General Comments      Exercises Other Exercises Other Exercises: Educated on recommendations with going home and verbally explaining how to complete stair training and car transfer for safety.   Assessment/Plan    PT Assessment Patient does not need any further PT services  PT Problem List         PT Treatment Interventions      PT Goals (Current goals can be found in the Care Plan section)  Acute Rehab PT Goals Patient Stated Goal: to go home PT Goal Formulation: With patient Time For Goal Achievement: 02/27/21 Potential to Achieve Goals: Good    Frequency     Barriers to discharge        Co-evaluation               AM-PAC PT "6 Clicks" Mobility  Outcome Measure Help needed turning from your back to your side while in a flat bed without using bedrails?: None Help needed moving from lying on your back to sitting on the side of a flat bed without using bedrails?: None Help needed moving to and from a bed to a chair (including a wheelchair)?: None Help needed standing up from a chair using your arms (e.g., wheelchair or bedside chair)?:  None Help needed to walk in hospital room?: A Little Help needed climbing 3-5 steps with a railing? : A Little 6 Click Score: 22    End of Session Equipment Utilized During Treatment: Gait belt Activity Tolerance: Patient tolerated treatment well Patient left: in chair;with call bell/phone within reach;with family/visitor present Nurse Communication: Mobility status PT Visit Diagnosis: Other abnormalities of gait and mobility (R26.89)    Time: 2694-8546 PT Time Calculation (min) (ACUTE ONLY): 18 min   Charges:   PT Evaluation $PT Eval Low Complexity: 1 Low PT Treatments $Gait Training: 8-22 mins        Andrey Campanile, SPT   Andrey Campanile 02/27/2021, 12:24 PM

## 2021-02-27 NOTE — Progress Notes (Signed)
OT Cancellation Note  Patient Details Name: Jonathon Barrera MRN: [id number] DOB: 05-28-1940   Cancelled Treatment:    Reason Eval/Treat Not Completed: OT screened, no needs identified, will sign off. Order received, chart reviewed. Pt back to baseline functional independence. No skilled acute OT needs identified. Will sign off. Please re-consult if additional needs arise.   Dessie Coma, M.S. OTR/L  02/27/21, 10:31 AM  ascom [phone]

## 2021-02-27 NOTE — Progress Notes (Signed)
Patient alert and oriented x4. Patient vascular site shows no signs of bleeding, no hematoma present, no bruising seen. +2 Radial and +1 dorsal pulses, extremities warm and dry. Patient ambulated the full unit, tolerated well. Patient voided post foley removal at 0900. Follow up appointment made, with Dr Jerelyn Scott, Thursday December 1 at 10am. Discharge and medication instructions reviewed with patient and wife.

## 2021-03-04 DIAGNOSIS — I739 Peripheral vascular disease, unspecified: Secondary | ICD-10-CM | POA: Diagnosis not present

## 2021-03-04 DIAGNOSIS — B0223 Postherpetic polyneuropathy: Secondary | ICD-10-CM | POA: Diagnosis not present

## 2021-03-04 DIAGNOSIS — Z09 Encounter for follow-up examination after completed treatment for conditions other than malignant neoplasm: Secondary | ICD-10-CM | POA: Diagnosis not present

## 2021-03-20 ENCOUNTER — Other Ambulatory Visit: Payer: Self-pay

## 2021-03-20 ENCOUNTER — Ambulatory Visit (INDEPENDENT_AMBULATORY_CARE_PROVIDER_SITE_OTHER): Payer: PPO | Admitting: Nurse Practitioner

## 2021-03-20 ENCOUNTER — Encounter (INDEPENDENT_AMBULATORY_CARE_PROVIDER_SITE_OTHER): Payer: Self-pay | Admitting: Nurse Practitioner

## 2021-03-20 VITALS — BP 117/70 | HR 101 | Ht 69.0 in | Wt 179.0 lb

## 2021-03-20 DIAGNOSIS — I10 Essential (primary) hypertension: Secondary | ICD-10-CM | POA: Insufficient documentation

## 2021-03-20 DIAGNOSIS — Z860100 Personal history of colon polyps, unspecified: Secondary | ICD-10-CM | POA: Insufficient documentation

## 2021-03-20 DIAGNOSIS — E785 Hyperlipidemia, unspecified: Secondary | ICD-10-CM | POA: Insufficient documentation

## 2021-03-20 DIAGNOSIS — I70211 Atherosclerosis of native arteries of extremities with intermittent claudication, right leg: Secondary | ICD-10-CM

## 2021-03-20 DIAGNOSIS — Z720 Tobacco use: Secondary | ICD-10-CM | POA: Insufficient documentation

## 2021-03-20 MED ORDER — SILVER SULFADIAZINE 1 % EX CREA: 1.0000 "application " | TOPICAL_CREAM | Freq: Every day | CUTANEOUS | 3 refills | Status: DC

## 2021-03-31 ENCOUNTER — Encounter (INDEPENDENT_AMBULATORY_CARE_PROVIDER_SITE_OTHER): Payer: Self-pay | Admitting: Nurse Practitioner

## 2021-03-31 NOTE — Progress Notes (Signed)
Subjective:    Patient ID: Jonathon Barrera, male    DOB: Feb 06, 1941, 80 y.o.   MRN: [id number] Chief Complaint  Patient presents with   Follow-up    2 week post site check    Patient presents today for evaluation following intervention on 02/27/2021 including:  PROCEDURE: 1.   Right common femoral, profunda femoris, and superficial femoral artery endarterectomies and patch angioplasty 2.   Right lower extremity angiogram 3.   Viabahn stent placement x2 to the right SFA and popliteal arteries with a pair of 6 mm diameter by 25 cm length Viabahn stents postdilated with 5 mm diameter Lutonix drug-coated angioplasty balloon  Today the wound is mostly healed with just a very small area of dehiscence at the proximal portion.  Overall he has been walking well with no significant drainage or bleeding.  He does have some soreness present but is doing well overall.   Review of Systems  Cardiovascular:  Negative for leg swelling.  Skin:  Positive for wound.  All other systems reviewed and are negative.     Objective:   Physical Exam Vitals reviewed.  HENT:     Head: Normocephalic.  Cardiovascular:     Rate and Rhythm: Tachycardia present.     Pulses:          Dorsalis pedis pulses are 2+ on the left side.       Posterior tibial pulses are 1+ on the left side.  Pulmonary:     Effort: Pulmonary effort is normal.  Skin:    General: Skin is warm and dry.  Neurological:     Mental Status: He is alert and oriented to person, place, and time.  Psychiatric:        Mood and Affect: Mood normal.        Behavior: Behavior normal.        Thought Content: Thought content normal.        Judgment: Judgment normal.    BP 117/70   Pulse (!) 101   Ht 5\' 9"  (1.753 m)   Wt 179 lb (81.2 kg)   BMI 26.43 kg/m   Past Medical History:  Diagnosis Date   Actinic keratosis    Acute deep vein thrombosis (DVT) of distal vein of right lower extremity ([place]) 06/19/2020   Aortic atherosclerosis (HCC)     Bladder carcinoma ([place]) 05/26/2007   a.) papillary-transitional cell   BPH (benign prostatic hyperplasia)    Colon polyps    COPD (chronic obstructive pulmonary disease) (HCC)    Current use of long term anticoagulation    a.) Apixaban   Diverticulosis    DOE (dyspnea on exertion)    HOH (hard of hearing)    a.) uses BILATERAL assistive devices   Hyperlipemia    Hypertension    Mass    Lt lower cheek   Melanoma ([place]) 2000   Tx by Dr Koleen Nimrod   PAD (peripheral artery disease) [facility])    Peripheral vascular disease ([place])    Pulmonary embolism ([place]) 06/19/2020   a.) RLL; no associated RIGHT heart strain   RBBB (right bundle branch block)    Squamous cell carcinoma of skin 02/01/2017   Left dorsum latera. hand. ED[facility]   Squamous cell carcinoma of skin 02/01/2017   Left dorsum base of thumb. [facility]   T2DM (type 2 diabetes mellitus) ([place])     Social History   Socioeconomic History   Marital status: Married    Spouse name: Not on file  Number of children: Not on file   Years of education: Not on file   Highest education level: Not on file  Occupational History   Not on file  Tobacco Use   Smoking status: Every Day    Packs/day: 0.50    Types: Cigarettes   Smokeless tobacco: Never  Vaping Use   Vaping Use: Never used  Substance and Sexual Activity   Alcohol use: Yes    Alcohol/week: 6.0 standard drinks    Types: 6 Cans of beer per week   Drug use: No   Sexual activity: Not on file  Other Topics Concern   Not on file  Social History Narrative   Not on file   Social Determinants of Health   Financial Resource Strain: Not on file  Food Insecurity: Not on file  Transportation Needs: Not on file  Physical Activity: Not on file  Stress: Not on file  Social Connections: Not on file  Intimate Partner Violence: Not on file    Past Surgical History:  Procedure Laterality Date   back skin cancer     BLADDER SURGERY     CARDIAC CATHETERIZATION     CATARACT  EXTRACTION W/PHACO Right 04/30/2015   Procedure: CATARACT EXTRACTION PHACO AND INTRAOCULAR LENS PLACEMENT ([place]);  Surgeon: Birder Robson, MD;  Location: [facility] ORS;  Service: Ophthalmology;  Laterality: Right;  [place] 01:03 AP% 21.4 CDE  13.57 fluid pack lot # [id number]   CATARACT EXTRACTION W/PHACO Left 05/21/2015   Procedure: CATARACT EXTRACTION PHACO AND INTRAOCULAR LENS PLACEMENT (IOC);  Surgeon: Birder Robson, MD;  Location: [facility] ORS;  Service: Ophthalmology;  Laterality: Left;  [place] 00:52 AP% 18.9 CDE 9.95 fluid pack lot #[id number]   COLONOSCOPY WITH PROPOFOL N/A 11/05/2014   Procedure: COLONOSCOPY WITH PROPOFOL;  Surgeon: Hulen Luster, MD;  Location: [facility] ENDOSCOPY;  Service: Gastroenterology;  Laterality: N/A;   ENDARTERECTOMY FEMORAL Right 02/26/2021   Procedure: ENDARTERECTOMY FEMORAL (SFA STENT PLACEMEN);  Surgeon: Algernon Huxley, MD;  Location: [facility] ORS;  Service: Vascular;  Laterality: Right;   HERNIA REPAIR     inguinal   KYPHOPLASTY N/A 02/23/2019   Procedure: T12 KYPHOPLASTY;  Surgeon: Hessie Knows, MD;  Location: [facility] ORS;  Service: Orthopedics;  Laterality: N/A;   LESION EXCISION N/A 02/23/2018   Procedure: EXCISION SCALP CYST AND FACIAL CYST;  Surgeon: Herbert Pun, MD;  Location: [facility] ORS;  Service: General;  Laterality: N/A;   LOWER EXTREMITY ANGIOGRAPHY Right 01/23/2021   Procedure: LOWER EXTREMITY ANGIOGRAPHY;  Surgeon: Algernon Huxley, MD;  Location: [place] CV LAB;  Service: Cardiovascular;  Laterality: Right;   SHOULDER ARTHROSCOPY W/ ROTATOR CUFF REPAIR Right    TUR-BT      Family History  Problem Relation Age of Onset   Diabetes Mother    Colon cancer Mother    Diabetes Father     No Known Allergies  CBC Latest Ref Rng & Units 02/27/2021 02/26/2021 02/26/2021  WBC 4.0 - 10.5 K/uL 4.9 4.0 -  Hemoglobin 13.0 - 17.0 g/dL 10.0(L) 10.9(L) 13.6  Hematocrit 39.0 - 52.0 % 30.2(L) 32.6(L) 40.0  Platelets 150 - 400 K/uL 115(L) 118(L) -      CMP     Component  Value Date/Time   NA 135 02/27/2021 0041   K 3.8 02/27/2021 0041   K 3.9 02/23/2012 0928   CL 107 02/27/2021 0041   CO2 23 02/27/2021 0041   GLUCOSE 215 (H) 02/27/2021 0041   BUN 19 02/27/2021 0041   CREATININE 1.01 02/27/2021  0041   CALCIUM 8.0 (L) 02/27/2021 0041   GFRNONAA >60 02/27/2021 0041     No results found.     Assessment & Plan:   1. Atherosclerosis of native artery of right lower extremity with intermittent claudication ([place]) Overall patient is doing well postprocedure.  We will have the patient place soft Silvadene on the open area of the wound daily.  He is advised to place the ointment and cover with a Band-Aid.  We will have the patient return to the office in approximately 2 weeks for wound reevaluation as well as noninvasive studies.   Current Outpatient Medications on File Prior to Visit  Medication Sig Dispense Refill   Artificial Tear Solution (SOOTHE XP) SOLN Place 1 drop into both eyes 3 (three) times daily as needed (dry eyes).     aspirin EC 81 MG EC tablet Take 1 tablet (81 mg total) by mouth daily at 6 (six) AM. Swallow whole. 90 tablet 3   ELIQUIS 5 MG TABS tablet Take 5 mg by mouth 2 (two) times daily.     finasteride (PROSCAR) 5 MG tablet Take 5 mg by mouth daily.     glimepiride (AMARYL) 4 MG tablet Take 4 mg by mouth in the morning and at bedtime.     hydrochlorothiazide (HYDRODIURIL) 25 MG tablet Take 25 mg by mouth daily.     Misc Natural Products (URINOZINC PLUS PO) Take 1 tablet by mouth 2 (two) times daily.      oxyCODONE-acetaminophen (PERCOCET/ROXICET) 5-325 MG tablet Take 1 tablet by mouth every 6 (six) hours as needed for moderate pain or severe pain. 28 tablet 0   telmisartan (MICARDIS) 80 MG tablet Take 80 mg by mouth daily.     No current facility-administered medications on file prior to visit.    There are no Patient Instructions on file for this visit. No follow-ups on file.   Kris Hartmann, NP

## 2021-04-02 ENCOUNTER — Other Ambulatory Visit (INDEPENDENT_AMBULATORY_CARE_PROVIDER_SITE_OTHER): Payer: Self-pay | Admitting: Nurse Practitioner

## 2021-04-02 DIAGNOSIS — Z95820 Peripheral vascular angioplasty status with implants and grafts: Secondary | ICD-10-CM

## 2021-04-02 DIAGNOSIS — I70211 Atherosclerosis of native arteries of extremities with intermittent claudication, right leg: Secondary | ICD-10-CM

## 2021-04-03 ENCOUNTER — Ambulatory Visit (INDEPENDENT_AMBULATORY_CARE_PROVIDER_SITE_OTHER): Payer: PPO

## 2021-04-03 ENCOUNTER — Ambulatory Visit (INDEPENDENT_AMBULATORY_CARE_PROVIDER_SITE_OTHER): Payer: PPO | Admitting: Nurse Practitioner

## 2021-04-03 ENCOUNTER — Other Ambulatory Visit: Payer: Self-pay

## 2021-04-03 VITALS — BP 123/75 | HR 80 | Ht 69.0 in | Wt 184.0 lb

## 2021-04-03 DIAGNOSIS — F17210 Nicotine dependence, cigarettes, uncomplicated: Secondary | ICD-10-CM | POA: Diagnosis not present

## 2021-04-03 DIAGNOSIS — Z720 Tobacco use: Secondary | ICD-10-CM

## 2021-04-03 DIAGNOSIS — I70211 Atherosclerosis of native arteries of extremities with intermittent claudication, right leg: Secondary | ICD-10-CM

## 2021-04-03 DIAGNOSIS — Z95820 Peripheral vascular angioplasty status with implants and grafts: Secondary | ICD-10-CM | POA: Diagnosis not present

## 2021-04-03 DIAGNOSIS — E118 Type 2 diabetes mellitus with unspecified complications: Secondary | ICD-10-CM

## 2021-04-13 ENCOUNTER — Encounter (INDEPENDENT_AMBULATORY_CARE_PROVIDER_SITE_OTHER): Payer: Self-pay | Admitting: Nurse Practitioner

## 2021-04-13 NOTE — Progress Notes (Signed)
Subjective:    Patient ID: Jonathon Barrera, male    DOB: 1940/05/03, 80 y.o.   MRN: [id number] Chief Complaint  Patient presents with   Follow-up    2 week abi    PROCEDURE: 1.   Right common femoral, profunda femoris, and superficial femoral artery endarterectomies and patch angioplasty 2.   Right lower extremity angiogram 3.   Viabahn stent placement x2 to the right SFA and popliteal arteries with a pair of 6 mm diameter by 25 cm length Viabahn stents postdilated with 5 mm diameter Lutonix drug-coated angioplasty balloon   Today the previous wound is completely healed.  He notes that the pain he had is is gone.  Overall he notes that he is doing much better.  Today his right ABI is 1.32 with a left to 0.86.  He has biphasic tibial artery waveforms on the right with monophasic on the left.  He has slightly dampened toe waveforms bilaterally.   Review of Systems  Cardiovascular:  Negative for leg swelling.  Skin:  Negative for wound.      Objective:   Physical Exam Vitals reviewed.  HENT:     Head: Normocephalic.  Cardiovascular:     Rate and Rhythm: Normal rate.     Pulses:          Dorsalis pedis pulses are 2+ on the right side and 1+ on the left side.       Posterior tibial pulses are 1+ on the right side and 1+ on the left side.  Pulmonary:     Effort: Pulmonary effort is normal.  Skin:    General: Skin is warm and dry.     Capillary Refill: Capillary refill takes less than 2 seconds.  Neurological:     Mental Status: He is alert and oriented to person, place, and time.  Psychiatric:        Mood and Affect: Mood normal.        Behavior: Behavior normal.        Thought Content: Thought content normal.        Judgment: Judgment normal.    BP 123/75    Pulse 80    Ht 5\' 9"  (1.753 m)    Wt 184 lb (83.5 kg)    BMI 27.17 kg/m   Past Medical History:  Diagnosis Date   Actinic keratosis    Acute deep vein thrombosis (DVT) of distal vein of right lower extremity ([place])  06/19/2020   Aortic atherosclerosis (HCC)    Bladder carcinoma ([place]) 05/26/2007   a.) papillary-transitional cell   BPH (benign prostatic hyperplasia)    Colon polyps    COPD (chronic obstructive pulmonary disease) (HCC)    Current use of long term anticoagulation    a.) Apixaban   Diverticulosis    DOE (dyspnea on exertion)    HOH (hard of hearing)    a.) uses BILATERAL assistive devices   Hyperlipemia    Hypertension    Mass    Lt lower cheek   Melanoma ([place]) 2000   Tx by Dr Koleen Nimrod   PAD (peripheral artery disease) [facility])    Peripheral vascular disease ([place])    Pulmonary embolism ([place]) 06/19/2020   a.) RLL; no associated RIGHT heart strain   RBBB (right bundle branch block)    Squamous cell carcinoma of skin 02/01/2017   Left dorsum latera. hand. ED[facility]   Squamous cell carcinoma of skin 02/01/2017   Left dorsum base of thumb. [place]  T2DM (type 2 diabetes mellitus) ([place])     Social History   Socioeconomic History   Marital status: Married    Spouse name: Not on file   Number of children: Not on file   Years of education: Not on file   Highest education level: Not on file  Occupational History   Not on file  Tobacco Use   Smoking status: Every Day    Packs/day: 0.50    Types: Cigarettes   Smokeless tobacco: Never  Vaping Use   Vaping Use: Never used  Substance and Sexual Activity   Alcohol use: Yes    Alcohol/week: 6.0 standard drinks    Types: 6 Cans of beer per week   Drug use: No   Sexual activity: Not on file  Other Topics Concern   Not on file  Social History Narrative   Not on file   Social Determinants of Health   Financial Resource Strain: Not on file  Food Insecurity: Not on file  Transportation Needs: Not on file  Physical Activity: Not on file  Stress: Not on file  Social Connections: Not on file  Intimate Partner Violence: Not on file    Past Surgical History:  Procedure Laterality Date   back skin cancer     BLADDER SURGERY      CARDIAC CATHETERIZATION     CATARACT EXTRACTION W/PHACO Right 04/30/2015   Procedure: CATARACT EXTRACTION PHACO AND INTRAOCULAR LENS PLACEMENT ([place]);  Surgeon: Birder Robson, MD;  Location: [facility] ORS;  Service: Ophthalmology;  Laterality: Right;  [place] 01:03 AP% 21.4 CDE  13.57 fluid pack lot # [id number] H   CATARACT EXTRACTION W/PHACO Left 05/21/2015   Procedure: CATARACT EXTRACTION PHACO AND INTRAOCULAR LENS PLACEMENT (IOC);  Surgeon: Birder Robson, MD;  Location: [facility] ORS;  Service: Ophthalmology;  Laterality: Left;  [place] 00:52 AP% 18.9 CDE 9.95 fluid pack lot #[id number]   COLONOSCOPY WITH PROPOFOL N/A 11/05/2014   Procedure: COLONOSCOPY WITH PROPOFOL;  Surgeon: Hulen Luster, MD;  Location: [facility] ENDOSCOPY;  Service: Gastroenterology;  Laterality: N/A;   ENDARTERECTOMY FEMORAL Right 02/26/2021   Procedure: ENDARTERECTOMY FEMORAL (SFA STENT PLACEMEN);  Surgeon: Algernon Huxley, MD;  Location: [facility] ORS;  Service: Vascular;  Laterality: Right;   HERNIA REPAIR     inguinal   KYPHOPLASTY N/A 02/23/2019   Procedure: T12 KYPHOPLASTY;  Surgeon: Hessie Knows, MD;  Location: [facility] ORS;  Service: Orthopedics;  Laterality: N/A;   LESION EXCISION N/A 02/23/2018   Procedure: EXCISION SCALP CYST AND FACIAL CYST;  Surgeon: Herbert Pun, MD;  Location: [facility] ORS;  Service: General;  Laterality: N/A;   LOWER EXTREMITY ANGIOGRAPHY Right 01/23/2021   Procedure: LOWER EXTREMITY ANGIOGRAPHY;  Surgeon: Algernon Huxley, MD;  Location: [place] CV LAB;  Service: Cardiovascular;  Laterality: Right;   SHOULDER ARTHROSCOPY W/ ROTATOR CUFF REPAIR Right    TUR-BT      Family History  Problem Relation Age of Onset   Diabetes Mother    Colon cancer Mother    Diabetes Father     No Known Allergies  CBC Latest Ref Rng & Units 02/27/2021 02/26/2021 02/26/2021  WBC 4.0 - 10.5 K/uL 4.9 4.0 -  Hemoglobin 13.0 - 17.0 g/dL 10.0(L) 10.9(L) 13.6  Hematocrit 39.0 - 52.0 % 30.2(L) 32.6(L) 40.0  Platelets 150 - 400 K/uL 115(L)  118(L) -      CMP     Component Value Date/Time   NA 135 02/27/2021 0041   K 3.8 02/27/2021 0041   K 3.9 02/23/2012  0928   CL 107 02/27/2021 0041   CO2 23 02/27/2021 0041   GLUCOSE 215 (H) 02/27/2021 0041   BUN 19 02/27/2021 0041   CREATININE 1.01 02/27/2021 0041   CALCIUM 8.0 (L) 02/27/2021 0041   GFRNONAA >60 02/27/2021 0041     VAS [place] ABI WITH/WO TBI  Result Date: 04/07/2021 1.23  LOWER EXTREMITY DOPPLER STUDY Patient Name:  ABB GOBERT  Date of Exam:   04/03/2021 Medical Rec #: [id number]         Accession #:    [id number] Date of Birth: 09/21/1940          Patient Gender: M Patient Age:   70 years Exam Location:  [facility] Procedure:      VAS [place] ABI WITH/WO TBI Referring Phys: Eulogio Ditch --------------------------------------------------------------------------------  Indications: Peripheral artery disease.  Vascular Interventions: 02/26/21: Right CFA/PFA/SFA endarterectomy with right                         SFA/popliteal stent;. Performing Technologist: Blondell Reveal RT, RDMS, RVT  Examination Guidelines: A complete evaluation includes at minimum, Doppler waveform signals and systolic blood pressure reading at the level of bilateral brachial, anterior tibial, and posterior tibial arteries, when vessel segments are accessible. Bilateral testing is considered an integral part of a complete examination. Photoelectric Plethysmograph (PPG) waveforms and toe systolic pressure readings are included as required and additional duplex testing as needed. Limited examinations for reoccurring indications may be performed as noted.  ABI Findings: +---------+------------------+-----+--------+--------+  Right     Rt Pressure (mmHg) Index Waveform Comment   +---------+------------------+-----+--------+--------+  Brachial  145                                         +---------+------------------+-----+--------+--------+  ATA                                absent              +---------+------------------+-----+--------+--------+  PTA       178                1.23  biphasic           +---------+------------------+-----+--------+--------+  PERO      175                1.21  biphasic           +---------+------------------+-----+--------+--------+  Great Toe 47                 0.32  Abnormal           +---------+------------------+-----+--------+--------+ +---------+------------------+-----+----------+-------+  Left      Lt Pressure (mmHg) Index Waveform   Comment  +---------+------------------+-----+----------+-------+  Brachial  144                                          +---------+------------------+-----+----------+-------+  ATA       121                0.83  monophasic          +---------+------------------+-----+----------+-------+  PTA       125  0.86  monophasic          +---------+------------------+-----+----------+-------+  Great Toe 56                 0.39  Abnormal            +---------+------------------+-----+----------+-------+ +-------+-----------+-----------+------------+------------+  ABI/TBI Today's ABI Today's TBI Previous ABI Previous TBI  +-------+-----------+-----------+------------+------------+  Right   1.23        0.32        0.79         0.58          +-------+-----------+-----------+------------+------------+  Left    0.86        0.39        0.93         0.65          +-------+-----------+-----------+------------+------------+ Right ABIs appear increased compared to prior study on 01/14/21. Left ABIs appear essentially unchanged.  Summary: Right: Resting right ankle-brachial index is within normal range. No evidence of significant right lower extremity arterial disease. The right toe-brachial index is abnormal. Left: Resting left ankle-brachial index indicates mild left lower extremity arterial disease. The left toe-brachial index is abnormal.  *See table(s) above for measurements and observations. Electronically signed by [name] MD on  04/07/2021 at 12:44:04 PM.    Final        Assessment & Plan:   1. Atherosclerosis of native artery of right lower extremity with intermittent claudication (HCC) Recommend:  The patient is status post successful endarterectomy with intervention.  The patient reports that the claudication symptoms and leg pain is essentially gone.   The patient denies lifestyle limiting changes at this point in time.  No further invasive studies, angiography or surgery at this time The patient should continue walking and begin a more formal exercise program.  The patient should continue antiplatelet therapy and aggressive treatment of the lipid abnormalities  Smoking cessation was again discussed  The patient should continue wearing graduated compression socks 10-15 mmHg strength to control the mild edema.  Patient should undergo noninvasive studies as ordered. The patient will follow up with me after the studies.    We will have the patient return in 3 months  2. Type II diabetes mellitus with complication (HCC) Continue hypoglycemic medications as already ordered, these medications have been reviewed and there are no changes at this time.  Hgb A1C to be monitored as already arranged by primary service   3. Tobacco abuse Smoking cessation was discussed, 3-10 minutes spent on this topic specifically    Current Outpatient Medications on File Prior to Visit  Medication Sig Dispense Refill   Artificial Tear Solution (SOOTHE XP) SOLN Place 1 drop into both eyes 3 (three) times daily as needed (dry eyes).     aspirin EC 81 MG EC tablet Take 1 tablet (81 mg total) by mouth daily at 6 (six) AM. Swallow whole. 90 tablet 3   ELIQUIS 5 MG TABS tablet Take 5 mg by mouth 2 (two) times daily.     finasteride (PROSCAR) 5 MG tablet Take 5 mg by mouth daily.     glimepiride (AMARYL) 4 MG tablet Take 4 mg by mouth in the morning and at bedtime.     hydrochlorothiazide (HYDRODIURIL) 25 MG tablet Take 25 mg by  mouth daily.     Misc Natural Products (URINOZINC PLUS PO) Take 1 tablet by mouth 2 (two) times daily.      oxyCODONE-acetaminophen (PERCOCET/ROXICET) 5-325 MG tablet Take 1 tablet by  mouth every 6 (six) hours as needed for moderate pain or severe pain. 28 tablet 0   silver sulfADIAZINE (SILVADENE) 1 % cream Apply 1 application topically daily. 50 g 3   telmisartan (MICARDIS) 80 MG tablet Take 80 mg by mouth daily.     No current facility-administered medications on file prior to visit.    There are no Patient Instructions on file for this visit. No follow-ups on file.   Kris Hartmann, NP

## 2021-04-25 DIAGNOSIS — E7800 Pure hypercholesterolemia, unspecified: Secondary | ICD-10-CM | POA: Diagnosis not present

## 2021-04-25 DIAGNOSIS — Z79899 Other long term (current) drug therapy: Secondary | ICD-10-CM | POA: Diagnosis not present

## 2021-04-25 DIAGNOSIS — I10 Essential (primary) hypertension: Secondary | ICD-10-CM | POA: Diagnosis not present

## 2021-04-25 DIAGNOSIS — E118 Type 2 diabetes mellitus with unspecified complications: Secondary | ICD-10-CM | POA: Diagnosis not present

## 2021-04-29 DIAGNOSIS — D649 Anemia, unspecified: Secondary | ICD-10-CM | POA: Diagnosis not present

## 2021-05-01 DIAGNOSIS — G245 Blepharospasm: Secondary | ICD-10-CM | POA: Diagnosis not present

## 2021-05-01 DIAGNOSIS — K5520 Angiodysplasia of colon without hemorrhage: Secondary | ICD-10-CM | POA: Diagnosis not present

## 2021-05-01 DIAGNOSIS — R195 Other fecal abnormalities: Secondary | ICD-10-CM | POA: Diagnosis not present

## 2021-05-01 DIAGNOSIS — D500 Iron deficiency anemia secondary to blood loss (chronic): Secondary | ICD-10-CM | POA: Diagnosis not present

## 2021-05-01 DIAGNOSIS — Z8371 Family history of colonic polyps: Secondary | ICD-10-CM | POA: Diagnosis not present

## 2021-05-01 DIAGNOSIS — Z86010 Personal history of colonic polyps: Secondary | ICD-10-CM | POA: Diagnosis not present

## 2021-05-01 DIAGNOSIS — Z800 Family history of malignant neoplasm of digestive organs: Secondary | ICD-10-CM | POA: Diagnosis not present

## 2021-05-15 DIAGNOSIS — Z1211 Encounter for screening for malignant neoplasm of colon: Secondary | ICD-10-CM | POA: Diagnosis not present

## 2021-05-16 ENCOUNTER — Encounter: Payer: Self-pay | Admitting: Gastroenterology

## 2021-05-18 ENCOUNTER — Encounter: Payer: Self-pay | Admitting: Gastroenterology

## 2021-05-18 NOTE — H&P (Signed)
Pre-Procedure H&P   Patient ID: Jonathon Barrera is a 81 y.o. male.  Gastroenterology Provider: Annamaria Helling, DO  Referring Provider: Octavia Bruckner, PA PCP: Idelle Crouch, MD  Date: 05/19/2021  HPI Mr. Jonathon Barrera is a 81 y.o. male who presents today for Esophagogastroduodenoscopy and Colonoscopy for Iron deficiency anemia, Hemoccult positive, personal history of colon polyps.  Patient currently on Eliquis for history of DVT/PE held for this procedure for 3 days. Patient has had several gram drop in hemoglobin going from 13 approx 5 months ago to 8.6 with ferritin of 16 iron sat 7%.  After his femoral endarterectomy in November of last year his hemoglobin was 10 postop. Had heme positive stools last year, however, developed DVT PE requiring anticoagulation which prevented him from undergoing colonoscopy. Currently on iron supplementation.  Normal stress test in October 2022 Actively uses half a pack a day of tobacco. Denies any other GI symptoms at this time Mother with colon cancer in her 12s.  Brother with colon polyps  Last colonoscopy July 2016 with sigmoid diverticulosis and cecal AVM.  2007 demonstrated tubular adenoma.  Past Medical History:  Diagnosis Date   Actinic keratosis    Acute deep vein thrombosis (DVT) of distal vein of right lower extremity ([place]) 06/19/2020   Aortic atherosclerosis (HCC)    Bladder carcinoma ([place]) 05/26/2007   a.) papillary-transitional cell   BPH (benign prostatic hyperplasia)    Colon polyps    COPD (chronic obstructive pulmonary disease) (HCC)    Current use of long term anticoagulation    a.) Apixaban   Diverticulosis    DOE (dyspnea on exertion)    HOH (hard of hearing)    a.) uses BILATERAL assistive devices   Hyperlipemia    Hypertension    Mass    Lt lower cheek   Melanoma ([place]) 2000   Tx by Dr Koleen Nimrod   PAD (peripheral artery disease) [facility])    Peripheral vascular disease ([place])    Pulmonary embolism ([place])  06/19/2020   a.) RLL; no associated RIGHT heart strain   RBBB (right bundle branch block)    Squamous cell carcinoma of skin 02/01/2017   Left dorsum latera. hand. EDC   Squamous cell carcinoma of skin 02/01/2017   Left dorsum base of thumb. EDC   T2DM (type 2 diabetes mellitus) ([place])     Past Surgical History:  Procedure Laterality Date   back skin cancer     BLADDER SURGERY     CARDIAC CATHETERIZATION     CATARACT EXTRACTION W/PHACO Right 04/30/2015   Procedure: CATARACT EXTRACTION PHACO AND INTRAOCULAR LENS PLACEMENT ([place]);  Surgeon: Birder Robson, MD;  Location: [facility] ORS;  Service: Ophthalmology;  Laterality: Right;  [place] 01:03 AP% 21.4 CDE  13.57 fluid pack lot # [id number]   CATARACT EXTRACTION W/PHACO Left 05/21/2015   Procedure: CATARACT EXTRACTION PHACO AND INTRAOCULAR LENS PLACEMENT ([facility]);  Surgeon: Birder Robson, MD;  Location: [facility] ORS;  Service: Ophthalmology;  Laterality: Left;  [place] 00:52 AP% 18.9 CDE 9.95 fluid pack lot #[id number]   COLONOSCOPY WITH PROPOFOL N/A 11/05/2014   Procedure: COLONOSCOPY WITH PROPOFOL;  Surgeon: Hulen Luster, MD;  Location: [facility] ENDOSCOPY;  Service: Gastroenterology;  Laterality: N/A;   ENDARTERECTOMY FEMORAL Right 02/26/2021   Procedure: ENDARTERECTOMY FEMORAL (SFA STENT PLACEMEN);  Surgeon: Algernon Huxley, MD;  Location: [facility] ORS;  Service: Vascular;  Laterality: Right;   HERNIA REPAIR     inguinal   KYPHOPLASTY N/A 02/23/2019   Procedure:  T12 KYPHOPLASTY;  Surgeon: Hessie Knows, MD;  Location: [facility] ORS;  Service: Orthopedics;  Laterality: N/A;   LESION EXCISION N/A 02/23/2018   Procedure: EXCISION SCALP CYST AND FACIAL CYST;  Surgeon: Herbert Pun, MD;  Location: [facility] ORS;  Service: General;  Laterality: N/A;   LOWER EXTREMITY ANGIOGRAPHY Right 01/23/2021   Procedure: LOWER EXTREMITY ANGIOGRAPHY;  Surgeon: Algernon Huxley, MD;  Location: [place];  Service: Cardiovascular;  Laterality: Right;   SHOULDER ARTHROSCOPY W/ ROTATOR  CUFF REPAIR Right    TUR-BT      Family History Mother with colon cancer in her 52s.  Brother with colon polyps No other h/o GI disease or malignancy  Review of Systems  Constitutional:  Negative for activity change, appetite change, chills, diaphoresis, fatigue, fever and unexpected weight change.  HENT:  Negative for trouble swallowing and voice change.   Respiratory:  Negative for shortness of breath and wheezing.   Cardiovascular:  Negative for chest pain, palpitations and leg swelling.  Gastrointestinal:  Positive for blood in stool (melena). Negative for abdominal distention, abdominal pain, anal bleeding, constipation, diarrhea, nausea and vomiting.  Musculoskeletal:  Negative for arthralgias and myalgias.  Skin:  Negative for color change and pallor.  Neurological:  Negative for dizziness, syncope and weakness.  Psychiatric/Behavioral:  Negative for confusion. The patient is not nervous/anxious.   All other systems reviewed and are negative.   Medications No current facility-administered medications on file prior to encounter.   Current Outpatient Medications on File Prior to Encounter  Medication Sig Dispense Refill   aspirin EC 81 MG EC tablet Take 1 tablet (81 mg total) by mouth daily at 6 (six) AM. Swallow whole. 90 tablet 3   ferrous sulfate 325 (65 FE) MG tablet Take 325 mg by mouth daily with breakfast.     finasteride (PROSCAR) 5 MG tablet Take 5 mg by mouth daily.     hydrochlorothiazide (HYDRODIURIL) 25 MG tablet Take 25 mg by mouth daily.     Saw Palmetto-Phytosterols (PROSTATE SR PO) Take 2 capsules by mouth daily.     telmisartan (MICARDIS) 80 MG tablet Take 80 mg by mouth daily.     Artificial Tear Solution (SOOTHE XP) SOLN Place 1 drop into both eyes 3 (three) times daily as needed (dry eyes).     ELIQUIS 5 MG TABS tablet Take 5 mg by mouth 2 (two) times daily.     glimepiride (AMARYL) 4 MG tablet Take 4 mg by mouth in the morning and at bedtime.     Misc  Natural Products (URINOZINC PLUS PO) Take 1 tablet by mouth 2 (two) times daily.      oxyCODONE-acetaminophen (PERCOCET/ROXICET) 5-325 MG tablet Take 1 tablet by mouth every 6 (six) hours as needed for moderate pain or severe pain. (Patient not taking: Reported on 05/19/2021) 28 tablet 0   silver sulfADIAZINE (SILVADENE) 1 % cream Apply 1 application topically daily. 50 g 3    Pertinent medications related to GI and procedure were reviewed by me with the patient prior to the procedure   Current Facility-Administered Medications:    0.9 %  sodium chloride infusion, , Intravenous, Continuous, Annamaria Helling, DO, Last Rate: 20 mL/hr at 05/19/21 0916, New Bag at 05/19/21 0916      No Known Allergies Allergies were reviewed by me prior to the procedure  Objective    Vitals:   05/19/21 0852  BP: 98/65  Pulse: 85  Resp: 18  Temp: (!) 96.7 F (35.9  C)  TempSrc: Temporal  SpO2: 100%  Weight: 83.9 kg  Height: 5\' 6"  (1.676 m)     Physical Exam Vitals and nursing note reviewed.  Constitutional:      General: He is not in acute distress.    Appearance: Normal appearance. He is not ill-appearing, toxic-appearing or diaphoretic.  HENT:     Head: Normocephalic and atraumatic.     Nose: Nose normal.     Mouth/Throat:     Mouth: Mucous membranes are moist.     Pharynx: Oropharynx is clear.     Comments: Upper and lower dentures in place Eyes:     General: No scleral icterus.    Extraocular Movements: Extraocular movements intact.  Cardiovascular:     Rate and Rhythm: Normal rate and regular rhythm.     Heart sounds: Normal heart sounds. No murmur heard.   No friction rub. No gallop.  Pulmonary:     Effort: Pulmonary effort is normal. No respiratory distress.     Breath sounds: Normal breath sounds. No wheezing, rhonchi or rales.  Abdominal:     General: Abdomen is flat. Bowel sounds are normal. There is no distension.     Palpations: Abdomen is soft.     Tenderness:  There is no abdominal tenderness. There is no guarding or rebound.  Musculoskeletal:     Cervical back: Neck supple.     Right lower leg: No edema.     Left lower leg: No edema.  Skin:    General: Skin is warm and dry.     Coloration: Skin is not jaundiced or pale.  Neurological:     General: No focal deficit present.     Mental Status: He is alert and oriented to person, place, and time. Mental status is at baseline.  Psychiatric:        Mood and Affect: Mood normal.        Behavior: Behavior normal.        Thought Content: Thought content normal.        Judgment: Judgment normal.     Assessment:  Mr. Jonathon Barrera is a 81 y.o. male  who presents today for Esophagogastroduodenoscopy and Colonoscopy for Iron deficiency anemia, Hemoccult positive, personal history of colon polyps.  Plan:  Esophagogastroduodenoscopy and Colonoscopy with possible intervention today  Esophagogastroduodenoscopy and Colonoscopy with possible biopsy, control of bleeding, polypectomy, and interventions as necessary has been discussed with the patient/patient representative. Informed consent was obtained from the patient/patient representative after explaining the indication, nature, and risks of the procedure including but not limited to death, bleeding, perforation, missed neoplasm/lesions, cardiorespiratory compromise, and reaction to medications. Opportunity for questions was given and appropriate answers were provided. Patient/patient representative has verbalized understanding is amenable to undergoing the procedure.   Annamaria Helling, DO  [facility] Gastroenterology  Portions of the record may have been created with voice recognition software. Occasional wrong-word or 'sound-a-like' substitutions may have occurred due to the inherent limitations of voice recognition software.  Read the chart carefully and recognize, using context, where substitutions may have occurred.

## 2021-05-19 ENCOUNTER — Ambulatory Visit: Payer: PPO | Admitting: Anesthesiology

## 2021-05-19 ENCOUNTER — Encounter: Payer: Self-pay | Admitting: Gastroenterology

## 2021-05-19 ENCOUNTER — Ambulatory Visit
Admission: RE | Admit: 2021-05-19 | Discharge: 2021-05-19 | Disposition: A | Discharge status: Home / Self Care | Payer: PPO | Attending: Gastroenterology | Admitting: Gastroenterology

## 2021-05-19 ENCOUNTER — Encounter
Admission: RE | Disposition: A | Discharge status: Home / Self Care | Payer: Self-pay | Source: Home / Self Care | Attending: Gastroenterology

## 2021-05-19 DIAGNOSIS — F17210 Nicotine dependence, cigarettes, uncomplicated: Secondary | ICD-10-CM | POA: Insufficient documentation

## 2021-05-19 DIAGNOSIS — K222 Esophageal obstruction: Secondary | ICD-10-CM | POA: Diagnosis not present

## 2021-05-19 DIAGNOSIS — Q2733 Arteriovenous malformation of digestive system vessel: Secondary | ICD-10-CM | POA: Diagnosis not present

## 2021-05-19 DIAGNOSIS — J449 Chronic obstructive pulmonary disease, unspecified: Secondary | ICD-10-CM | POA: Diagnosis not present

## 2021-05-19 DIAGNOSIS — K5521 Angiodysplasia of colon with hemorrhage: Secondary | ICD-10-CM | POA: Insufficient documentation

## 2021-05-19 DIAGNOSIS — K5790 Diverticulosis of intestine, part unspecified, without perforation or abscess without bleeding: Secondary | ICD-10-CM | POA: Diagnosis not present

## 2021-05-19 DIAGNOSIS — Z800 Family history of malignant neoplasm of digestive organs: Secondary | ICD-10-CM | POA: Diagnosis not present

## 2021-05-19 DIAGNOSIS — D509 Iron deficiency anemia, unspecified: Secondary | ICD-10-CM | POA: Insufficient documentation

## 2021-05-19 DIAGNOSIS — Z8371 Family history of colonic polyps: Secondary | ICD-10-CM | POA: Diagnosis not present

## 2021-05-19 DIAGNOSIS — K2980 Duodenitis without bleeding: Secondary | ICD-10-CM | POA: Insufficient documentation

## 2021-05-19 DIAGNOSIS — D122 Benign neoplasm of ascending colon: Secondary | ICD-10-CM | POA: Diagnosis not present

## 2021-05-19 DIAGNOSIS — K640 First degree hemorrhoids: Secondary | ICD-10-CM | POA: Diagnosis not present

## 2021-05-19 DIAGNOSIS — K209 Esophagitis, unspecified without bleeding: Secondary | ICD-10-CM | POA: Insufficient documentation

## 2021-05-19 DIAGNOSIS — K317 Polyp of stomach and duodenum: Secondary | ICD-10-CM | POA: Diagnosis not present

## 2021-05-19 DIAGNOSIS — E1151 Type 2 diabetes mellitus with diabetic peripheral angiopathy without gangrene: Secondary | ICD-10-CM | POA: Insufficient documentation

## 2021-05-19 DIAGNOSIS — K264 Chronic or unspecified duodenal ulcer with hemorrhage: Secondary | ICD-10-CM | POA: Insufficient documentation

## 2021-05-19 DIAGNOSIS — N319 Neuromuscular dysfunction of bladder, unspecified: Secondary | ICD-10-CM | POA: Diagnosis not present

## 2021-05-19 DIAGNOSIS — Z1211 Encounter for screening for malignant neoplasm of colon: Secondary | ICD-10-CM | POA: Diagnosis not present

## 2021-05-19 DIAGNOSIS — D124 Benign neoplasm of descending colon: Secondary | ICD-10-CM | POA: Diagnosis not present

## 2021-05-19 DIAGNOSIS — Z79899 Other long term (current) drug therapy: Secondary | ICD-10-CM | POA: Insufficient documentation

## 2021-05-19 DIAGNOSIS — Z86718 Personal history of other venous thrombosis and embolism: Secondary | ICD-10-CM | POA: Insufficient documentation

## 2021-05-19 DIAGNOSIS — Z7901 Long term (current) use of anticoagulants: Secondary | ICD-10-CM | POA: Diagnosis not present

## 2021-05-19 DIAGNOSIS — K921 Melena: Secondary | ICD-10-CM | POA: Insufficient documentation

## 2021-05-19 DIAGNOSIS — K5730 Diverticulosis of large intestine without perforation or abscess without bleeding: Secondary | ICD-10-CM | POA: Diagnosis not present

## 2021-05-19 DIAGNOSIS — K5520 Angiodysplasia of colon without hemorrhage: Secondary | ICD-10-CM | POA: Diagnosis not present

## 2021-05-19 DIAGNOSIS — K649 Unspecified hemorrhoids: Secondary | ICD-10-CM | POA: Diagnosis not present

## 2021-05-19 DIAGNOSIS — I10 Essential (primary) hypertension: Secondary | ICD-10-CM | POA: Diagnosis not present

## 2021-05-19 DIAGNOSIS — Z86711 Personal history of pulmonary embolism: Secondary | ICD-10-CM | POA: Insufficient documentation

## 2021-05-19 DIAGNOSIS — E785 Hyperlipidemia, unspecified: Secondary | ICD-10-CM | POA: Diagnosis not present

## 2021-05-19 DIAGNOSIS — K269 Duodenal ulcer, unspecified as acute or chronic, without hemorrhage or perforation: Secondary | ICD-10-CM | POA: Diagnosis not present

## 2021-05-19 DIAGNOSIS — Z86010 Personal history of colonic polyps: Secondary | ICD-10-CM | POA: Diagnosis not present

## 2021-05-19 HISTORY — PX: ESOPHAGOGASTRODUODENOSCOPY (EGD) WITH PROPOFOL: SHX5813

## 2021-05-19 HISTORY — PX: COLONOSCOPY WITH PROPOFOL: SHX5780

## 2021-05-19 LAB — GLUCOSE, CAPILLARY: Glucose-Capillary: 165 mg/dL — ABNORMAL HIGH (ref 70–99)

## 2021-05-19 SURGERY — COLONOSCOPY WITH PROPOFOL
Anesthesia: General

## 2021-05-19 MED ORDER — EPHEDRINE SULFATE (PRESSORS) 50 MG/ML IJ SOLN
INTRAMUSCULAR | Status: DC | PRN
  Administered 2021-05-19: 5 mg via INTRAVENOUS
  Administered 2021-05-19 (×2): 10 mg via INTRAVENOUS

## 2021-05-19 MED ORDER — PROPOFOL 10 MG/ML IV BOLUS
INTRAVENOUS | Status: DC | PRN
  Administered 2021-05-19: 20 mg via INTRAVENOUS
  Administered 2021-05-19: 50 mg via INTRAVENOUS

## 2021-05-19 MED ORDER — SODIUM CHLORIDE 0.9 % IV SOLN: INTRAVENOUS | Status: DC

## 2021-05-19 MED ORDER — PROPOFOL 500 MG/50ML IV EMUL
INTRAVENOUS | Status: DC | PRN
  Administered 2021-05-19: 200 ug/kg/min via INTRAVENOUS

## 2021-05-19 MED ORDER — PHENYLEPHRINE HCL (PRESSORS) 10 MG/ML IV SOLN
INTRAVENOUS | Status: DC | PRN
  Administered 2021-05-19 (×2): 80 ug via INTRAVENOUS
  Administered 2021-05-19 (×2): 160 ug via INTRAVENOUS
  Administered 2021-05-19 (×3): 80 ug via INTRAVENOUS

## 2021-05-19 NOTE — Op Note (Signed)
[facility] Gastroenterology Patient Name: Jonathon Barrera Procedure Date: 05/19/2021 9:35 AM MRN: [id number] Account #: [id number] Date of Birth: 05-Mar-1941 Admit Type: Outpatient Age: 81 Room: [facility] ENDO ROOM 2 Gender: Male Note Status: Finalized Instrument Name: Upper Endoscope [id number] Procedure:             Upper GI endoscopy Indications:           Melena, Iron deficiency anemia Providers:             Rueben Bash, DO Referring MD:          Leonie Douglas. Doy Hutching, MD (Referring MD) Medicines:             Monitored Anesthesia Care Complications:         No immediate complications. Estimated blood loss:                         Minimal. Procedure:             Pre-Anesthesia Assessment:                        - Prior to the procedure, a History and Physical was                         performed, and patient medications and allergies were                         reviewed. The patient is competent. The risks and                         benefits of the procedure and the sedation options and                         risks were discussed with the patient. All questions                         were answered and informed consent was obtained.                         Patient identification and proposed procedure were                         verified by the physician, the nurse, the anesthetist                         and the technician in the endoscopy suite. Mental                         Status Examination: alert and oriented. Airway                         Examination: normal oropharyngeal airway and neck                         mobility. Respiratory Examination: clear to                         auscultation. CV Examination: RRR, no murmurs, no S3  or S4. Prophylactic Antibiotics: The patient does not                         require prophylactic antibiotics. Prior                         Anticoagulants: The patient has taken Eliquis                          (apixaban), last dose was 3 days prior to procedure.                         ASA Grade Assessment: III - A patient with severe                         systemic disease. After reviewing the risks and                         benefits, the patient was deemed in satisfactory                         condition to undergo the procedure. The anesthesia                         plan was to use monitored anesthesia care (MAC).                         Immediately prior to administration of medications,                         the patient was re-assessed for adequacy to receive                         sedatives. The heart rate, respiratory rate, oxygen                         saturations, blood pressure, adequacy of pulmonary                         ventilation, and response to care were monitored                         throughout the procedure. The physical status of the                         patient was re-assessed after the procedure.                        After obtaining informed consent, the endoscope was                         passed under direct vision. Throughout the procedure,                         the patient's blood pressure, pulse, and oxygen                         saturations were monitored continuously. The Endoscope  was introduced through the mouth, and advanced to the                         second part of duodenum. The upper GI endoscopy was                         accomplished without difficulty. The patient tolerated                         the procedure well. Findings:      A few localized erosions without bleeding were found in the duodenal       bulb. Biopsies for histology were taken with a cold forceps for       evaluation of celiac disease. Estimated blood loss was minimal.      The entire examined stomach was normal. Biopsies were taken with a cold       forceps for Helicobacter pylori testing. Estimated blood loss was        minimal.      LA Grade A (one or more mucosal breaks less than 5 mm, not extending       between tops of 2 mucosal folds) esophagitis with no bleeding was found.       Estimated blood loss: none.      A widely patent and non-obstructing Schatzki ring was found at the       gastroesophageal junction. Estimated blood loss: none.      The exam of the esophagus was otherwise normal.      The exam was otherwise without abnormality. Impression:            - Duodenal erosions without bleeding. Biopsied.                        - Normal stomach. Biopsied.                        - LA Grade A esophagitis with no bleeding.                        - Widely patent and non-obstructing Schatzki ring.                        - The examination was otherwise normal. Recommendation:        - Discharge patient to home.                        - Resume previous diet.                        - Resume Eliquis (apixaban) at prior dose in 2 days.                         Refer to managing physician for further adjustment of                         therapy.                        - Continue present medications.                        -  Use Protonix (pantoprazole) 40 mg PO daily.                        - Await pathology results.                        - Return to GI clinic as previously scheduled.                        - The findings and recommendations were discussed with                         the patient's family.                        - The findings and recommendations were discussed with                         the patient. Procedure Code(s):     --- Professional ---                        [phone], Esophagogastroduodenoscopy, flexible,                         transoral; with biopsy, single or multiple Diagnosis Code(s):     --- Professional ---                        K26.9, Duodenal ulcer, unspecified as acute or                         chronic, without hemorrhage or perforation                        K20.90,  Esophagitis, unspecified without bleeding                        K22.2, Esophageal obstruction                        K92.1, Melena (includes Hematochezia)                        D50.9, Iron deficiency anemia, unspecified CPT copyright 2019 American Medical Association. All rights reserved. The codes documented in this report are preliminary and upon coder review may  be revised to meet current compliance requirements. Attending Participation:      I personally performed the entire procedure. Volney American, DO Annamaria Helling DO, DO 05/19/2021 10:37:08 AM This report has been signed electronically. Number of Addenda: 0 Note Initiated On: 05/19/2021 9:35 AM Estimated Blood Loss:  Estimated blood loss was minimal.      [facility]

## 2021-05-19 NOTE — Anesthesia Postprocedure Evaluation (Signed)
Anesthesia Post Note  Patient: Jonathon Barrera  Procedure(s) Performed: COLONOSCOPY WITH PROPOFOL ESOPHAGOGASTRODUODENOSCOPY (EGD) WITH PROPOFOL  Patient location during evaluation: Endoscopy Anesthesia Type: General Level of consciousness: awake and alert Pain management: pain level controlled Vital Signs Assessment: post-procedure vital signs reviewed and stable Respiratory status: spontaneous breathing, nonlabored ventilation, respiratory function stable and patient connected to nasal cannula oxygen Cardiovascular status: blood pressure returned to baseline and stable Postop Assessment: no apparent nausea or vomiting Anesthetic complications: no   No notable events documented.   Last Vitals:  Vitals:   05/19/21 1045 05/19/21 1055  BP: 117/70 125/68  Pulse: 76 76  Resp: (!) 21 (!) 21  Temp:    SpO2: 100% 100%    Last Pain:  Vitals:   05/19/21 1055  TempSrc:   PainSc: 0-No pain                 Arita Miss

## 2021-05-19 NOTE — Interval H&P Note (Signed)
History and Physical Interval Note: Preprocedure H&P from 05/19/21  was reviewed and there was no interval change after seeing and examining the patient.  Written consent was obtained from the patient after discussion of risks, benefits, and alternatives. Patient has consented to proceed with Esophagogastroduodenoscopy and Colonoscopy with possible intervention   05/19/2021 9:31 AM  Quinn Plowman  has presented today for surgery, with the diagnosis of D50.0 - Iron deficiency anemia due to chronic blood loss R19.5- Heme positive stool Z86.010 - Hx of adenomatous polyp of colon K55.20  AVM (arteriovenous malformation) of colon.  The various methods of treatment have been discussed with the patient and family. After consideration of risks, benefits and other options for treatment, the patient has consented to  Procedure(s) with comments: COLONOSCOPY WITH PROPOFOL (N/A) - DM Patient is hard of hearing and would like for wife to answer questions, please. ESOPHAGOGASTRODUODENOSCOPY (EGD) WITH PROPOFOL (N/A) as a surgical intervention.  The patient's history has been reviewed, patient examined, no change in status, stable for surgery.  I have reviewed the patient's chart and labs.  Questions were answered to the patient's satisfaction.     Annamaria Helling

## 2021-05-19 NOTE — Op Note (Signed)
[facility] Gastroenterology Patient Name: Jonathon Barrera Procedure Date: 05/19/2021 9:34 AM MRN: [id number] Account #: [id number] Date of Birth: [birth year] Admit Type: Outpatient Age: 81 Room: [facility] ENDO ROOM 2 Gender: Male Note Status: [place] Instrument Name: Jasper Riling [id number] Procedure:             Colonoscopy Indications:           Melena, Evaluation of unexplained GI bleeding                         presenting with fecal occult blood, Personal history                         of colonic polyps Providers:             Rueben Bash, DO Referring MD:          Leonie Douglas. Doy Hutching, MD (Referring MD) Medicines:             Monitored Anesthesia Care Complications:         No immediate complications. Estimated blood loss:                         Minimal. Procedure:             Pre-Anesthesia Assessment:                        - Prior to the procedure, a History and Physical was                         performed, and patient medications and allergies were                         reviewed. The patient is competent. The risks and                         benefits of the procedure and the sedation options and                         risks were discussed with the patient. All questions                         were answered and informed consent was obtained.                         Patient identification and proposed procedure were                         verified by the physician, the nurse, the anesthetist                         and the technician in the endoscopy suite. Mental                         Status Examination: alert and oriented. Airway                         Examination: normal oropharyngeal airway and neck  mobility. Respiratory Examination: clear to                         auscultation. CV Examination: RRR, no murmurs, no S3                         or S4. Prophylactic Antibiotics: The patient does not                          require prophylactic antibiotics. Prior                         Anticoagulants: The patient has taken Eliquis                         (apixaban), last dose was 3 days prior to procedure.                         ASA Grade Assessment: III - A patient with severe                         systemic disease. After reviewing the risks and                         benefits, the patient was deemed in satisfactory                         condition to undergo the procedure. The anesthesia                         plan was to use monitored anesthesia care (MAC).                         Immediately prior to administration of medications,                         the patient was re-assessed for adequacy to receive                         sedatives. The heart rate, respiratory rate, oxygen                         saturations, blood pressure, adequacy of pulmonary                         ventilation, and response to care were monitored                         throughout the procedure. The physical status of the                         patient was re-assessed after the procedure.                        After obtaining informed consent, the colonoscope was                         passed under direct vision. Throughout the procedure,  the patient's blood pressure, pulse, and oxygen                         saturations were monitored continuously. The                         Colonoscope was introduced through the anus and                         advanced to the the cecum, identified by appendiceal                         orifice and ileocecal valve. The colonoscopy was                         performed without difficulty. The patient tolerated                         the procedure well. The quality of the bowel                         preparation was evaluated using the BBPS [facility] Bowel                         Preparation Scale) with scores of: Right Colon = 3,                          Transverse Colon = 3 and Left Colon = 3 (entire mucosa                         seen well with no residual staining, small fragments                         of stool or opaque liquid). The total BBPS score                         equals 9. The ileocecal valve, appendiceal orifice,                         and rectum were photographed. Findings:      The perianal and digital rectal examinations were normal. Pertinent       negatives include normal sphincter tone.      Multiple small-mouthed diverticula were found in the left colon.       Estimated blood loss: none.      Non-bleeding internal hemorrhoids were found during retroflexion. The       hemorrhoids were Grade I (internal hemorrhoids that do not prolapse).      Two sessile polyps were found in the descending colon and ascending       colon. The polyps were 5 to 7 mm in size. These polyps were removed with       a cold snare. Resection and retrieval were complete. Estimated blood       loss was minimal. To prevent bleeding after the polypectomy, one       hemostatic clip was successfully placed (MR conditional). There was no       bleeding during, or at the end, of the procedure. Clip placed on  ascending colon polypectomy site. Estimated blood loss was minimal.      Multiple small patchy angioectasias without bleeding were found in the       ascending colon and in the cecum. Coagulation for bleeding prevention       using argon plasma was successful. Estimated blood loss was minimal.      The exam was otherwise without abnormality on direct and retroflexion       views. Impression:            - Diverticulosis in the left colon.                        - Non-bleeding internal hemorrhoids.                        - Two 5 to 7 mm polyps in the descending colon and in                         the ascending colon, removed with a cold snare.                         Resected and retrieved. Clip (MR conditional) was                          placed.                        - Multiple non-bleeding colonic angioectasias. Treated                         with argon plasma coagulation (APC).                        - The examination was otherwise normal on direct and                         retroflexion views. Recommendation:        - Discharge patient to home.                        - Resume previous diet.                        - Continue present medications.                        - No aspirin, ibuprofen, naproxen, or other                         non-steroidal anti-inflammatory drugs for 5 days after                         polyp removal.                        - Resume Eliquis (apixaban) at prior dose in 2 days.                         Refer to managing physician for further adjustment of  therapy.                        - Await pathology results.                        - Repeat colonoscopy for surveillance based on                         pathology results.                        - Return to GI office as previously scheduled.                        - The findings and recommendations were discussed with                         the patient.                        - The findings and recommendations were discussed with                         the patient's family. Procedure Code(s):     --- Professional ---                        [phone], 59, Colonoscopy, flexible; with control of                         bleeding, any method                        45385, Colonoscopy, flexible; with removal of                         tumor(s), polyp(s), or other lesion(s) by snare                         technique Diagnosis Code(s):     --- Professional ---                        K64.0, First degree hemorrhoids                        K63.5, Polyp of colon                        K55.20, Angiodysplasia of colon without hemorrhage                        K92.1, Melena (includes Hematochezia)                        R19.5, Other  fecal abnormalities                        Z86.010, Personal history of colonic polyps                        K57.30, Diverticulosis of large intestine without  perforation or abscess without bleeding CPT copyright 2019 American Medical Association. All rights reserved. The codes documented in this report are preliminary and upon coder review may  be revised to meet current compliance requirements. Attending Participation:      I personally performed the entire procedure. Volney American, DO Annamaria Helling DO, DO 05/19/2021 10:44:26 AM This report has been signed electronically. Number of Addenda: 0 Note Initiated On: 05/19/2021 9:34 AM Scope Withdrawal Time: 0 hours 30 minutes 23 seconds  Total Procedure Duration: 0 hours 39 minutes 40 seconds  Estimated Blood Loss:  Estimated blood loss was minimal.      [facility]

## 2021-05-19 NOTE — Transfer of Care (Addendum)
Immediate Anesthesia Transfer of Care Note  Patient: Jonathon Barrera  Procedure(s) Performed: COLONOSCOPY WITH PROPOFOL ESOPHAGOGASTRODUODENOSCOPY (EGD) WITH PROPOFOL  Patient Location: PACU  Anesthesia Type:General  Level of Consciousness: awake, alert  and oriented  Airway & Oxygen Therapy: Patient Spontanous Breathing and Patient connected to nasal cannula oxygen  Post-op Assessment: Report given to RN and Post -op Vital signs reviewed and stable  Post vital signs: Reviewed and stable  Last Vitals:  Vitals Value Taken Time  BP 111/56 05/19/21 1035  Temp    Pulse 72 05/19/21 1036  Resp 20 05/19/21 1036  SpO2 100 % 05/19/21 1036  Vitals shown include unvalidated device data.  Last Pain:  Vitals:   05/19/21 0852  TempSrc: Temporal         Complications: No notable events documented.

## 2021-05-19 NOTE — Anesthesia Procedure Notes (Signed)
Date/Time: 05/19/2021 9:43 AM Performed by: Nelda Marseille, CRNA Pre-anesthesia Checklist: Patient identified, Emergency Drugs available, Suction available, Patient being monitored and Timeout performed Oxygen Delivery Method: Nasal cannula

## 2021-05-19 NOTE — Anesthesia Preprocedure Evaluation (Signed)
Anesthesia Evaluation  Patient identified by MRN, date of birth, ID band Patient awake    Reviewed: Allergy & Precautions, NPO status , Patient's Chart, lab work & pertinent test results  History of Anesthesia Complications Negative for: history of anesthetic complications  Airway Mallampati: III  TM Distance: >3 FB Neck ROM: Limited    Dental  (+) Lower Dentures, Upper Dentures   Pulmonary neg sleep apnea, COPD, Current SmokerPatient did not abstain from smoking.,    Pulmonary exam normal breath sounds clear to auscultation       Cardiovascular Exercise Tolerance: Good METShypertension, Pt. on medications + Peripheral Vascular Disease  (-) CAD and (-) Past MI Normal cardiovascular exam(-) dysrhythmias  Rhythm:Regular Rate:Normal  Normal stress echo 2022   Neuro/Psych negative neurological ROS  negative psych ROS   GI/Hepatic negative GI ROS, Neg liver ROS, neg GERD  ,  Endo/Other  diabetes, Well Controlled, Type 2  Renal/GU negative Renal ROS Bladder dysfunction  Bladder CA Hx    Musculoskeletal negative musculoskeletal ROS (+)   Abdominal Normal abdominal exam  (+)   Peds negative pediatric ROS (+)  Hematology negative hematology ROS (+)   Anesthesia Other Findings Past Medical History: No date: Bladder carcinoma (HCC) No date: BPH (benign prostatic hyperplasia) No date: Cancer ([place])     Comment:  MELANOMA No date: Colon polyps No date: COPD (chronic obstructive pulmonary disease) (HCC) No date: Diabetes mellitus without complication (HCC) No date: HOH (hard of hearing)     Comment:  AIDS No date: Hyperlipemia No date: Hypertension No date: Mass     Comment:  Lt lower cheek No date: Skin sore  Reproductive/Obstetrics                             Anesthesia Physical  Anesthesia Plan  ASA: 3  Anesthesia Plan: General   Post-op Pain Management: Minimal or no pain  anticipated   Induction: Intravenous  PONV Risk Score and Plan: 1 and Ondansetron, Treatment may vary due to age or medical condition, Propofol infusion and TIVA  Airway Management Planned: Natural Airway  Additional Equipment: None  Intra-op Plan:   Post-operative Plan:   Informed Consent: I have reviewed the patients History and Physical, chart, labs and discussed the procedure including the risks, benefits and alternatives for the proposed anesthesia with the patient or authorized representative who has indicated his/her understanding and acceptance.     Dental advisory given  Plan Discussed with: CRNA and Surgeon  Anesthesia Plan Comments: (Discussed risks of anesthesia with patient, including possibility of difficulty with spontaneous ventilation under anesthesia necessitating airway intervention, PONV, and rare risks such as cardiac or respiratory or neurological events, and allergic reactions. Discussed the role of CRNA in patient's perioperative care. Patient understands. Patient counseled on benefits of smoking cessation, and increased perioperative risks associated with continued smoking. )        Anesthesia Quick Evaluation

## 2021-05-20 LAB — SURGICAL PATHOLOGY

## 2021-05-21 DIAGNOSIS — J431 Panlobular emphysema: Secondary | ICD-10-CM | POA: Diagnosis not present

## 2021-05-21 DIAGNOSIS — E7800 Pure hypercholesterolemia, unspecified: Secondary | ICD-10-CM | POA: Diagnosis not present

## 2021-05-21 DIAGNOSIS — I10 Essential (primary) hypertension: Secondary | ICD-10-CM | POA: Diagnosis not present

## 2021-05-21 DIAGNOSIS — I2699 Other pulmonary embolism without acute cor pulmonale: Secondary | ICD-10-CM | POA: Diagnosis not present

## 2021-05-21 DIAGNOSIS — D500 Iron deficiency anemia secondary to blood loss (chronic): Secondary | ICD-10-CM | POA: Insufficient documentation

## 2021-05-21 DIAGNOSIS — E118 Type 2 diabetes mellitus with unspecified complications: Secondary | ICD-10-CM | POA: Diagnosis not present

## 2021-06-03 DIAGNOSIS — R7989 Other specified abnormal findings of blood chemistry: Secondary | ICD-10-CM | POA: Diagnosis not present

## 2021-06-08 IMAGING — XA DG THORACIC SPINE 2V
1 series · 1 of 1 positions shown · non-contrast
Comparison: MRI of the thoracic spine 02/20/2019

CLINICAL DATA: kyphoplasty T12;  FT: 1 min 53 secSURGERY

EXAM:
THORACIC SPINE 2 VIEWS; DG C-ARM 1-60 MIN

[Series 6001: m1 · 1 of 1 slices shown]
[im 1/1]
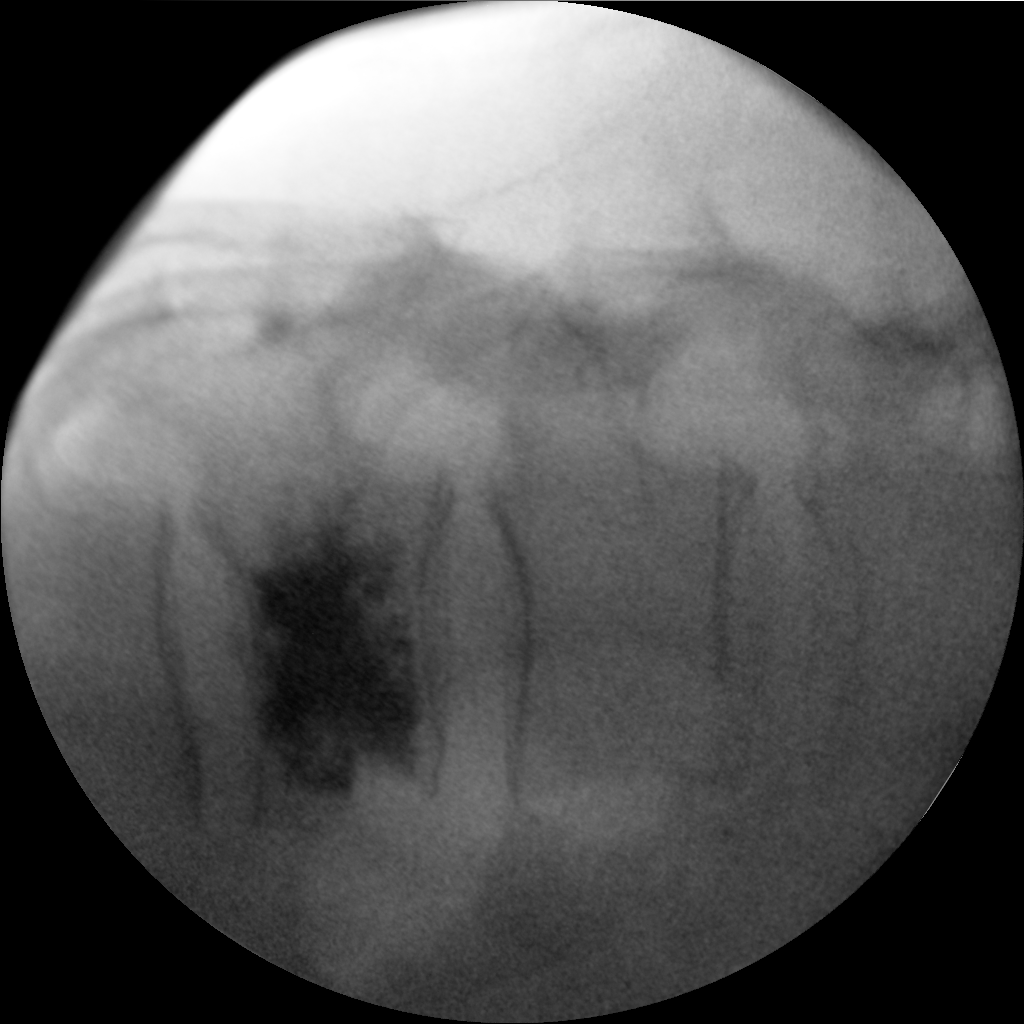

[1 of 1 positions shown; findings below may reference images not displayed]

FINDINGS: Two images are submitted, labeled T12, and demonstrate interval
vertebral augmentation at T12.
IMPRESSION: Interval T12 vertebral augmentation.

## 2021-06-11 ENCOUNTER — Encounter (INDEPENDENT_AMBULATORY_CARE_PROVIDER_SITE_OTHER): Payer: Self-pay

## 2021-06-11 ENCOUNTER — Other Ambulatory Visit: Payer: Self-pay

## 2021-06-11 ENCOUNTER — Inpatient Hospital Stay: Payer: PPO | Admitting: Internal Medicine

## 2021-06-11 ENCOUNTER — Encounter: Payer: Self-pay | Admitting: Internal Medicine

## 2021-06-11 ENCOUNTER — Inpatient Hospital Stay: Payer: PPO | Attending: Internal Medicine

## 2021-06-11 DIAGNOSIS — F17210 Nicotine dependence, cigarettes, uncomplicated: Secondary | ICD-10-CM | POA: Diagnosis not present

## 2021-06-11 DIAGNOSIS — Z79899 Other long term (current) drug therapy: Secondary | ICD-10-CM | POA: Insufficient documentation

## 2021-06-11 DIAGNOSIS — Z85820 Personal history of malignant melanoma of skin: Secondary | ICD-10-CM | POA: Insufficient documentation

## 2021-06-11 DIAGNOSIS — D649 Anemia, unspecified: Secondary | ICD-10-CM | POA: Insufficient documentation

## 2021-06-11 DIAGNOSIS — E785 Hyperlipidemia, unspecified: Secondary | ICD-10-CM | POA: Diagnosis not present

## 2021-06-11 DIAGNOSIS — Z8551 Personal history of malignant neoplasm of bladder: Secondary | ICD-10-CM | POA: Diagnosis not present

## 2021-06-11 DIAGNOSIS — Z86718 Personal history of other venous thrombosis and embolism: Secondary | ICD-10-CM | POA: Insufficient documentation

## 2021-06-11 DIAGNOSIS — N400 Enlarged prostate without lower urinary tract symptoms: Secondary | ICD-10-CM | POA: Diagnosis not present

## 2021-06-11 DIAGNOSIS — Z7984 Long term (current) use of oral hypoglycemic drugs: Secondary | ICD-10-CM | POA: Diagnosis not present

## 2021-06-11 DIAGNOSIS — E1151 Type 2 diabetes mellitus with diabetic peripheral angiopathy without gangrene: Secondary | ICD-10-CM | POA: Insufficient documentation

## 2021-06-11 DIAGNOSIS — I10 Essential (primary) hypertension: Secondary | ICD-10-CM | POA: Insufficient documentation

## 2021-06-11 DIAGNOSIS — Z7901 Long term (current) use of anticoagulants: Secondary | ICD-10-CM | POA: Insufficient documentation

## 2021-06-11 DIAGNOSIS — J449 Chronic obstructive pulmonary disease, unspecified: Secondary | ICD-10-CM | POA: Insufficient documentation

## 2021-06-11 DIAGNOSIS — Z7982 Long term (current) use of aspirin: Secondary | ICD-10-CM | POA: Insufficient documentation

## 2021-06-11 DIAGNOSIS — K5900 Constipation, unspecified: Secondary | ICD-10-CM | POA: Diagnosis not present

## 2021-06-11 DIAGNOSIS — Z86711 Personal history of pulmonary embolism: Secondary | ICD-10-CM | POA: Insufficient documentation

## 2021-06-11 LAB — CBC WITH DIFFERENTIAL/PLATELET
Abs Immature Granulocytes: 0.02 10*3/uL (ref 0.00–0.07)
Basophils Absolute: 0.1 10*3/uL (ref 0.0–0.1)
Basophils Relative: 2 %
Eosinophils Absolute: 0.2 10*3/uL (ref 0.0–0.5)
Eosinophils Relative: 4 %
HCT: 32.9 % — ABNORMAL LOW (ref 39.0–52.0)
Hemoglobin: 10.1 g/dL — ABNORMAL LOW (ref 13.0–17.0)
Immature Granulocytes: 0 %
Lymphocytes Relative: 34 %
Lymphs Abs: 1.6 10*3/uL (ref 0.7–4.0)
MCH: 30.5 pg (ref 26.0–34.0)
MCHC: 30.7 g/dL (ref 30.0–36.0)
MCV: 99.4 fL (ref 80.0–100.0)
Monocytes Absolute: 0.3 10*3/uL (ref 0.1–1.0)
Monocytes Relative: 7 %
Neutro Abs: 2.4 10*3/uL (ref 1.7–7.7)
Neutrophils Relative %: 53 %
Platelets: 203 10*3/uL (ref 150–400)
RBC: 3.31 MIL/uL — ABNORMAL LOW (ref 4.22–5.81)
RDW: 22.6 % — ABNORMAL HIGH (ref 11.5–15.5)
WBC: 4.6 10*3/uL (ref 4.0–10.5)
nRBC: 0 % (ref 0.0–0.2)

## 2021-06-11 LAB — IRON AND TIBC
Iron: 140 ug/dL (ref 45–182)
Saturation Ratios: 34 % (ref 17.9–39.5)
TIBC: 413 ug/dL (ref 250–450)
UIBC: 273 ug/dL

## 2021-06-11 LAB — FERRITIN: Ferritin: 82 ng/mL (ref 24–336)

## 2021-06-11 LAB — COMPREHENSIVE METABOLIC PANEL
ALT: 30 U/L (ref 0–44)
AST: 27 U/L (ref 15–41)
Albumin: 4.2 g/dL (ref 3.5–5.0)
Alkaline Phosphatase: 79 U/L (ref 38–126)
Anion gap: 8 (ref 5–15)
BUN: 21 mg/dL (ref 8–23)
CO2: 28 mmol/L (ref 22–32)
Calcium: 9.5 mg/dL (ref 8.9–10.3)
Chloride: 99 mmol/L (ref 98–111)
Creatinine, Ser: 0.97 mg/dL (ref 0.61–1.24)
GFR, Estimated: >60 mL/min (ref >60)
Glucose, Bld: 150 mg/dL — ABNORMAL HIGH (ref 70–99)
Potassium: 3.7 mmol/L (ref 3.5–5.1)
Sodium: 135 mmol/L (ref 135–145)
Total Bilirubin: 0.4 mg/dL (ref 0.3–1.2)
Total Protein: 8.4 g/dL — ABNORMAL HIGH (ref 6.5–8.1)

## 2021-06-11 LAB — TECHNOLOGIST SMEAR REVIEW
Plt Morphology: NORMAL
RBC MORPHOLOGY: NORMAL
WBC MORPHOLOGY: NORMAL

## 2021-06-11 LAB — ABO/RH: ABO/RH(D): A POS

## 2021-06-11 LAB — LACTATE DEHYDROGENASE: LDH: 118 U/L (ref 98–192)

## 2021-06-11 LAB — RETICULOCYTES
Immature Retic Fract: 35.2 % — ABNORMAL HIGH (ref 2.3–15.9)
RBC.: 3.32 MIL/uL — ABNORMAL LOW (ref 4.22–5.81)
Retic Count, Absolute: 136.5 10*3/uL (ref 19.0–186.0)
Retic Ct Pct: 4.1 % — ABNORMAL HIGH (ref 0.4–3.1)

## 2021-06-11 NOTE — Progress Notes (Signed)
[place] NOTE  Patient Care Team: Idelle Crouch, MD as PCP - General (Internal Medicine)  CHIEF COMPLAINTS/PURPOSE OF CONSULTATION: ANEMIA  HEMATOLOGY HISTORY:  # ANEMIA NORMOCYTIC  [JAN 2023- PCP-hemoglobin 8.7; saturation 7%; ferritin 23-LL-28]EGD/ colonoscopy-JAN 2023 [[facility]]  March 2022-PE small volume right lower lobe; and right lower extremity above-knee DVT.  On Eliquis.  # Hx of PVD [Dr.Dew]  HISTORY OF PRESENTING ILLNESS:  Jonathon Barrera 81 y.o.  male has been referred to [place] for further evaluation/work-up for anemia.  Patient noted to be mildly anemic since 2022 summer.  Of note patient had a blood clot in March 2022-PE small volume right lower lobe; and right lower extremity above-knee DVT.  On Eliquis.  Notes to have mild to moderate fatigue in the last few months.  Continues to work part-time.  Blood in stools: Hemorrhoidal bleeding [colo; JAN 2023] Change in bowel habits- None Blood in urine: None Difficulty swallowing: None Abnormal weight loss: None Iron supplementation: Jan 2023; intermittent constipation Prior Blood transfusions: none Bariatric surgery: None  Review of Systems  Constitutional:  Positive for malaise/fatigue. Negative for chills, diaphoresis, fever and weight loss.  HENT:  Negative for nosebleeds and sore throat.   Eyes:  Negative for double vision.  Respiratory:  Negative for cough, hemoptysis, sputum production, shortness of breath and wheezing.   Cardiovascular:  Negative for chest pain, palpitations, orthopnea and leg swelling.  Gastrointestinal:  Negative for abdominal pain, blood in stool, constipation, diarrhea, heartburn, melena, nausea and vomiting.  Genitourinary:  Negative for dysuria, frequency and urgency.  Musculoskeletal:  Positive for joint pain. Negative for back pain.  Skin: Negative.  Negative for itching and rash.  Neurological:  Negative for dizziness, tingling, focal weakness, weakness and  headaches.  Endo/Heme/Allergies:  Does not bruise/bleed easily.  Psychiatric/Behavioral:  Negative for depression. The patient is not nervous/anxious and does not have insomnia.    MEDICAL HISTORY:  Past Medical History:  Diagnosis Date   Actinic keratosis    Acute deep vein thrombosis (DVT) of distal vein of right lower extremity ([place]) 06/19/2020   Aortic atherosclerosis (HCC)    Bladder carcinoma ([place]) 05/26/2007   a.) papillary-transitional cell   BPH (benign prostatic hyperplasia)    Colon polyps    COPD (chronic obstructive pulmonary disease) (HCC)    Current use of long term anticoagulation    a.) Apixaban   Diverticulosis    DOE (dyspnea on exertion)    HOH (hard of hearing)    a.) uses BILATERAL assistive devices   Hyperlipemia    Hypertension    Mass    Lt lower cheek   Melanoma ([place]) 2000   Tx by Dr Koleen Nimrod   PAD (peripheral artery disease) [facility])    Peripheral vascular disease ([place])    Pulmonary embolism ([place]) 06/19/2020   a.) RLL; no associated RIGHT heart strain   RBBB (right bundle branch block)    Squamous cell carcinoma of skin 02/01/2017   Left dorsum latera. hand. EDC   Squamous cell carcinoma of skin 02/01/2017   Left dorsum base of thumb. EDC   T2DM (type 2 diabetes mellitus) ([place])     SURGICAL HISTORY: Past Surgical History:  Procedure Laterality Date   back skin cancer     BLADDER SURGERY     CARDIAC CATHETERIZATION     CATARACT EXTRACTION W/PHACO Right 04/30/2015   Procedure: CATARACT EXTRACTION PHACO AND INTRAOCULAR LENS PLACEMENT (IOC);  Surgeon: Birder Robson, MD;  Location: [facility] ORS;  Service: Ophthalmology;  Laterality: Right;  [place] 01:03 AP% 21.4 CDE  13.57 fluid pack lot # [id number]   CATARACT EXTRACTION W/PHACO Left 05/21/2015   Procedure: CATARACT EXTRACTION PHACO AND INTRAOCULAR LENS PLACEMENT (IOC);  Surgeon: Birder Robson, MD;  Location: [facility] ORS;  Service: Ophthalmology;  Laterality: Left;  [place] 00:52 AP% 18.9 CDE 9.95 fluid  pack lot #[id number]   COLONOSCOPY WITH PROPOFOL N/A 11/05/2014   Procedure: COLONOSCOPY WITH PROPOFOL;  Surgeon: Hulen Luster, MD;  Location: [facility] ENDOSCOPY;  Service: Gastroenterology;  Laterality: N/A;   COLONOSCOPY WITH PROPOFOL N/A 05/19/2021   Procedure: COLONOSCOPY WITH PROPOFOL;  Surgeon: Annamaria Helling, DO;  Location: [facility] ENDOSCOPY;  Service: Gastroenterology;  Laterality: N/A;  DM Patient is hard of hearing and would like for wife to answer questions, please.   ENDARTERECTOMY FEMORAL Right 02/26/2021   Procedure: ENDARTERECTOMY FEMORAL (SFA STENT PLACEMEN);  Surgeon: Algernon Huxley, MD;  Location: [facility] ORS;  Service: Vascular;  Laterality: Right;   ESOPHAGOGASTRODUODENOSCOPY (EGD) WITH PROPOFOL N/A 05/19/2021   Procedure: ESOPHAGOGASTRODUODENOSCOPY (EGD) WITH PROPOFOL;  Surgeon: Annamaria Helling, DO;  Location: [facility];  Service: Gastroenterology;  Laterality: N/A;   HERNIA REPAIR     inguinal   KYPHOPLASTY N/A 02/23/2019   Procedure: T12 KYPHOPLASTY;  Surgeon: Hessie Knows, MD;  Location: [facility] ORS;  Service: Orthopedics;  Laterality: N/A;   LESION EXCISION N/A 02/23/2018   Procedure: EXCISION SCALP CYST AND FACIAL CYST;  Surgeon: Herbert Pun, MD;  Location: [facility] ORS;  Service: General;  Laterality: N/A;   LOWER EXTREMITY ANGIOGRAPHY Right 01/23/2021   Procedure: LOWER EXTREMITY ANGIOGRAPHY;  Surgeon: Algernon Huxley, MD;  Location: [facility] CV LAB;  Service: Cardiovascular;  Laterality: Right;   SHOULDER ARTHROSCOPY W/ ROTATOR CUFF REPAIR Right    TUR-BT      SOCIAL HISTORY: Social History   Socioeconomic History   Marital status: Married    Spouse name: Not on file   Number of children: Not on file   Years of education: Not on file   Highest education level: Not on file  Occupational History   Not on file  Tobacco Use   Smoking status: Every Day    Packs/day: 0.50    Types: Cigarettes   Smokeless tobacco: Never  Vaping Use   Vaping Use: Never used   Substance and Sexual Activity   Alcohol use: Yes    Alcohol/week: 6.0 standard drinks    Types: 6 Cans of beer per week   Drug use: No   Sexual activity: Not on file  Other Topics Concern   Not on file  Social History Narrative   Lives in [place]; with wife. 2 children/near by. Smokes 1/2 ppd; 3-4 beers/weekend. Retired in 2010; Systems analyst.    Social Determinants of Health   Financial Resource Strain: Not on file  Food Insecurity: Not on file  Transportation Needs: Not on file  Physical Activity: Not on file  Stress: Not on file  Social Connections: Not on file  Intimate Partner Violence: Not on file    FAMILY HISTORY: Family History  Problem Relation Age of Onset   Diabetes Mother    Colon cancer Mother    Diabetes Father     ALLERGIES:  has No Known Allergies.  MEDICATIONS:  Current Outpatient Medications  Medication Sig Dispense Refill   Artificial Tear Solution (SOOTHE XP) SOLN Place 1 drop into both eyes 3 (three) times daily as needed (dry eyes).     ascorbic  acid (VITAMIN C) 500 MG tablet Take by mouth.     aspirin EC 81 MG EC tablet Take 1 tablet (81 mg total) by mouth daily at 6 (six) AM. Swallow whole. 90 tablet 3   ELIQUIS 5 MG TABS tablet Take 5 mg by mouth 2 (two) times daily.     ferrous sulfate 325 (65 FE) MG tablet Take 325 mg by mouth 2 (two) times daily with a meal.     finasteride (PROSCAR) 5 MG tablet Take 5 mg by mouth daily.     glimepiride (AMARYL) 4 MG tablet Take 4 mg by mouth in the morning and at bedtime.     hydrochlorothiazide (HYDRODIURIL) 25 MG tablet Take 25 mg by mouth daily.     Misc Natural Products (URINOZINC PLUS PO) Take 1 tablet by mouth 2 (two) times daily.      pantoprazole (PROTONIX) 40 MG tablet Take 1 tablet by mouth 2 (two) times daily.     telmisartan (MICARDIS) 80 MG tablet Take 80 mg by mouth daily.     No current facility-administered medications for this visit.      PHYSICAL  EXAMINATION:   Vitals:   06/11/21 1116  BP: (!) 142/76  Pulse: 75  Temp: 97.9 F (36.6 C)  SpO2: 100%   Filed Weights   06/11/21 1116  Weight: 185 lb 9.6 oz (84.2 kg)    Physical Exam Vitals and nursing note reviewed.  HENT:     Head: Normocephalic and atraumatic.     Mouth/Throat:     Pharynx: Oropharynx is clear.  Eyes:     Extraocular Movements: Extraocular movements intact.     Pupils: Pupils are equal, round, and reactive to light.  Cardiovascular:     Rate and Rhythm: Normal rate and regular rhythm.  Pulmonary:     Comments: Decreased breath sounds bilaterally.  Abdominal:     Palpations: Abdomen is soft.  Musculoskeletal:        General: Normal range of motion.     Cervical back: Normal range of motion.  Skin:    General: Skin is warm.  Neurological:     General: No focal deficit present.     Mental Status: He is alert and oriented to person, place, and time.  Psychiatric:        Behavior: Behavior normal.        Judgment: Judgment normal.    LABORATORY DATA:  I have reviewed the data as listed Lab Results  Component Value Date   WBC 4.9 02/27/2021   HGB 10.0 (L) 02/27/2021   HCT 30.2 (L) 02/27/2021   MCV 94.4 02/27/2021   PLT 115 (L) 02/27/2021   Recent Labs    02/24/21 1317 02/26/21 0812 02/26/21 1437 02/27/21 0041  NA 135 138  --  135  K 3.2* 4.5  --  3.8  CL 94* 99  --  107  CO2 27  --   --  23  GLUCOSE 114* 144*  --  215*  BUN 17 28*  --  19  CREATININE 0.85 1.10 0.89 1.01  CALCIUM 8.5*  --   --  8.0*  GFRNONAA >60  --  >60 >60     No results found.  Symptomatic anemia # Moderate to severe anemia hemoglobin 8; MCV normal; platelets normal [FEB 2023; PCP]-ferritin-16 [LN-24; iron saturation 7%]; normal white count/platelets.  The etiology is unclear-question multifactorial-iron deficiency/primary bone marrow disorder-recommended trial of IV iron infusion Venofer weekly x4. Discussed the potential acute  infusion reactions with IV  iron; which are quite rare.  Patient understands the risk; will proceed with infusions.  Plan bone marrow biopsy if-iron infusions do not help/anemia gets worse.  Check CBC CMP LDH  haptoglobin; iron studies/ferritin; myeloma panel kappa lambda light chain; review of peripheral smear; reticulocyte count.  # March 2022 [Dr.Sparks]-small volume PE/ RIGHT LE DVT- Positive for deep venous thrombosis in the right lower extremity [Above Knee- fem]; August 2022 repeat ultrasound-improved not resolved-on Eliquis. ? Etiology- smoking.   # Hx o PVD [s/p stenting]- on asprin [Dr.Dew]  Thank you Dr. Doy Hutching for allowing me to participate in the care of your pleasant patient. Please do not hesitate to contact me with questions or concerns in the interim.  # DISPOSITION: # labs- ordered # weekly venofer x3- start ASAP # follow up in 3 weeks; MD; labs- cbc-order/possible venofer-Dr.B   All questions were answered. The patient knows to call the clinic with any problems, questions or concerns.    Cammie Sickle, MD 06/11/2021 12:35 PM

## 2021-06-11 NOTE — Assessment & Plan Note (Addendum)
#  Moderate to severe anemia hemoglobin 8; MCV normal; platelets normal [FEB 2023; PCP]-ferritin-16 [LN-24; iron saturation 7%]; normal white count/platelets.  The etiology is unclear-question multifactorial-iron deficiency/primary bone marrow disorder-recommended trial of IV iron infusion Venofer weekly x4. Discussed the potential acute infusion reactions with IV iron; which are quite rare.  Patient understands the risk; will proceed with infusions.  Plan bone marrow biopsy if-iron infusions do not help/anemia gets worse.  Check CBC CMP LDH  haptoglobin; iron studies/ferritin; myeloma panel kappa lambda light chain; review of peripheral smear; reticulocyte count.  # March 2022 [Dr.Sparks]-small volume PE/ RIGHT LE DVT- Positive for deep venous thrombosis in the right lower extremity [Above Knee- fem]; August 2022 repeat ultrasound-improved not resolved-on Eliquis. ? Etiology- smoking.   # Hx o PVD [s/p stenting]- on asprin [Dr.Dew]  Thank you Dr. Doy Hutching for allowing me to participate in the care of your pleasant patient. Please do not hesitate to contact me with questions or concerns in the interim.  # DISPOSITION: # labs- ordered # weekly venofer x3- start ASAP # follow up in 3 weeks; MD; labs- cbc-order/possible venofer-Dr.B

## 2021-06-12 LAB — KAPPA/LAMBDA LIGHT CHAINS
Kappa free light chain: 351.3 mg/L — ABNORMAL HIGH (ref 3.3–19.4)
Kappa, lambda light chain ratio: 33.46 — ABNORMAL HIGH (ref 0.26–1.65)
Lambda free light chains: 10.5 mg/L (ref 5.7–26.3)

## 2021-06-12 LAB — HAPTOGLOBIN: Haptoglobin: 187 mg/dL (ref 34–355)

## 2021-06-16 LAB — MULTIPLE MYELOMA PANEL, SERUM
Albumin SerPl Elph-Mcnc: 4 g/dL (ref 2.9–4.4)
Albumin/Glob SerPl: 1.1 (ref 0.7–1.7)
Alpha 1: 0.1 g/dL (ref 0.0–0.4)
Alpha2 Glob SerPl Elph-Mcnc: 0.8 g/dL (ref 0.4–1.0)
B-Globulin SerPl Elph-Mcnc: 0.9 g/dL (ref 0.7–1.3)
Gamma Glob SerPl Elph-Mcnc: 2 g/dL — ABNORMAL HIGH (ref 0.4–1.8)
Globulin, Total: 3.9 g/dL (ref 2.2–3.9)
IgA: 80 mg/dL (ref 61–437)
IgG (Immunoglobin G), Serum: 2093 mg/dL — ABNORMAL HIGH (ref 603–1613)
IgM (Immunoglobulin M), Srm: 161 mg/dL — ABNORMAL HIGH (ref 15–143)
M Protein SerPl Elph-Mcnc: 1.6 g/dL — ABNORMAL HIGH
Total Protein ELP: 7.9 g/dL (ref 6.0–8.5)

## 2021-06-18 ENCOUNTER — Inpatient Hospital Stay: Payer: PPO | Attending: Internal Medicine

## 2021-06-18 ENCOUNTER — Other Ambulatory Visit: Payer: Self-pay

## 2021-06-18 VITALS — BP 148/77 | HR 66 | Temp 97.9°F | Resp 16

## 2021-06-18 DIAGNOSIS — D509 Iron deficiency anemia, unspecified: Secondary | ICD-10-CM | POA: Diagnosis not present

## 2021-06-18 DIAGNOSIS — D649 Anemia, unspecified: Secondary | ICD-10-CM

## 2021-06-18 DIAGNOSIS — D472 Monoclonal gammopathy: Secondary | ICD-10-CM | POA: Diagnosis not present

## 2021-06-18 DIAGNOSIS — Z79899 Other long term (current) drug therapy: Secondary | ICD-10-CM | POA: Insufficient documentation

## 2021-06-18 MED ORDER — IRON SUCROSE 20 MG/ML IV SOLN
200.0000 mg | Freq: Once | INTRAVENOUS | Status: AC
  Administered 2021-06-18: 200 mg via INTRAVENOUS
  Filled 2021-06-18: qty 10

## 2021-06-18 MED ORDER — SODIUM CHLORIDE 0.9 % IV SOLN
Freq: Once | INTRAVENOUS | Status: AC
  Filled 2021-06-18: qty 250

## 2021-06-18 MED ORDER — SODIUM CHLORIDE 0.9 % IV SOLN: 200.0000 mg | Freq: Once | INTRAVENOUS | Status: DC

## 2021-06-24 ENCOUNTER — Inpatient Hospital Stay: Payer: PPO

## 2021-06-24 ENCOUNTER — Other Ambulatory Visit: Payer: Self-pay

## 2021-06-24 VITALS — BP 133/65 | HR 75 | Temp 99.4°F | Resp 18

## 2021-06-24 DIAGNOSIS — D649 Anemia, unspecified: Secondary | ICD-10-CM

## 2021-06-24 DIAGNOSIS — D472 Monoclonal gammopathy: Secondary | ICD-10-CM | POA: Diagnosis not present

## 2021-06-24 MED ORDER — IRON SUCROSE 20 MG/ML IV SOLN
200.0000 mg | Freq: Once | INTRAVENOUS | Status: AC
  Administered 2021-06-24: 200 mg via INTRAVENOUS
  Filled 2021-06-24: qty 10

## 2021-06-24 MED ORDER — SODIUM CHLORIDE 0.9 % IV SOLN: 200.0000 mg | Freq: Once | INTRAVENOUS | Status: DC

## 2021-06-24 MED ORDER — SODIUM CHLORIDE 0.9 % IV SOLN
Freq: Once | INTRAVENOUS | Status: AC
  Filled 2021-06-24: qty 250

## 2021-07-01 ENCOUNTER — Inpatient Hospital Stay: Payer: PPO

## 2021-07-01 ENCOUNTER — Other Ambulatory Visit: Payer: Self-pay

## 2021-07-01 VITALS — BP 158/77 | HR 70 | Temp 97.0°F | Resp 18

## 2021-07-01 DIAGNOSIS — D472 Monoclonal gammopathy: Secondary | ICD-10-CM | POA: Diagnosis not present

## 2021-07-01 DIAGNOSIS — D649 Anemia, unspecified: Secondary | ICD-10-CM

## 2021-07-01 MED ORDER — IRON SUCROSE 20 MG/ML IV SOLN
200.0000 mg | Freq: Once | INTRAVENOUS | Status: AC
  Administered 2021-07-01: 200 mg via INTRAVENOUS
  Filled 2021-07-01: qty 10

## 2021-07-01 MED ORDER — SODIUM CHLORIDE 0.9 % IV SOLN: 200.0000 mg | Freq: Once | INTRAVENOUS | Status: DC

## 2021-07-01 MED ORDER — SODIUM CHLORIDE 0.9 % IV SOLN
Freq: Once | INTRAVENOUS | Status: AC
  Filled 2021-07-01: qty 250

## 2021-07-01 NOTE — Patient Instructions (Signed)

## 2021-07-02 ENCOUNTER — Encounter (INDEPENDENT_AMBULATORY_CARE_PROVIDER_SITE_OTHER): Payer: PPO

## 2021-07-02 ENCOUNTER — Ambulatory Visit (INDEPENDENT_AMBULATORY_CARE_PROVIDER_SITE_OTHER): Payer: PPO | Admitting: Nurse Practitioner

## 2021-07-02 DIAGNOSIS — I2699 Other pulmonary embolism without acute cor pulmonale: Secondary | ICD-10-CM | POA: Diagnosis not present

## 2021-07-02 DIAGNOSIS — J431 Panlobular emphysema: Secondary | ICD-10-CM | POA: Diagnosis not present

## 2021-07-02 DIAGNOSIS — Z0000 Encounter for general adult medical examination without abnormal findings: Secondary | ICD-10-CM | POA: Diagnosis not present

## 2021-07-02 DIAGNOSIS — I10 Essential (primary) hypertension: Secondary | ICD-10-CM | POA: Diagnosis not present

## 2021-07-02 DIAGNOSIS — E782 Mixed hyperlipidemia: Secondary | ICD-10-CM | POA: Diagnosis not present

## 2021-07-02 DIAGNOSIS — E118 Type 2 diabetes mellitus with unspecified complications: Secondary | ICD-10-CM | POA: Diagnosis not present

## 2021-07-03 ENCOUNTER — Other Ambulatory Visit (INDEPENDENT_AMBULATORY_CARE_PROVIDER_SITE_OTHER): Payer: Self-pay | Admitting: Vascular Surgery

## 2021-07-03 ENCOUNTER — Ambulatory Visit (INDEPENDENT_AMBULATORY_CARE_PROVIDER_SITE_OTHER): Payer: PPO

## 2021-07-03 ENCOUNTER — Encounter (INDEPENDENT_AMBULATORY_CARE_PROVIDER_SITE_OTHER): Payer: Self-pay | Admitting: Nurse Practitioner

## 2021-07-03 ENCOUNTER — Other Ambulatory Visit (INDEPENDENT_AMBULATORY_CARE_PROVIDER_SITE_OTHER): Payer: Self-pay | Admitting: Nurse Practitioner

## 2021-07-03 ENCOUNTER — Ambulatory Visit (INDEPENDENT_AMBULATORY_CARE_PROVIDER_SITE_OTHER): Payer: PPO | Admitting: Nurse Practitioner

## 2021-07-03 ENCOUNTER — Other Ambulatory Visit: Payer: Self-pay

## 2021-07-03 VITALS — BP 120/72 | HR 77 | Resp 17 | Ht 69.0 in | Wt 186.4 lb

## 2021-07-03 DIAGNOSIS — I739 Peripheral vascular disease, unspecified: Secondary | ICD-10-CM

## 2021-07-03 DIAGNOSIS — E118 Type 2 diabetes mellitus with unspecified complications: Secondary | ICD-10-CM | POA: Diagnosis not present

## 2021-07-03 DIAGNOSIS — I70233 Atherosclerosis of native arteries of right leg with ulceration of ankle: Secondary | ICD-10-CM

## 2021-07-03 DIAGNOSIS — Z95820 Peripheral vascular angioplasty status with implants and grafts: Secondary | ICD-10-CM | POA: Diagnosis not present

## 2021-07-03 DIAGNOSIS — Z720 Tobacco use: Secondary | ICD-10-CM | POA: Diagnosis not present

## 2021-07-04 ENCOUNTER — Inpatient Hospital Stay: Payer: PPO

## 2021-07-04 ENCOUNTER — Encounter: Payer: Self-pay | Admitting: Internal Medicine

## 2021-07-04 ENCOUNTER — Inpatient Hospital Stay (HOSPITAL_BASED_OUTPATIENT_CLINIC_OR_DEPARTMENT_OTHER): Payer: PPO | Admitting: Internal Medicine

## 2021-07-04 DIAGNOSIS — D649 Anemia, unspecified: Secondary | ICD-10-CM

## 2021-07-04 DIAGNOSIS — D472 Monoclonal gammopathy: Secondary | ICD-10-CM | POA: Diagnosis not present

## 2021-07-04 LAB — CBC WITH DIFFERENTIAL/PLATELET
Abs Immature Granulocytes: 0.01 10*3/uL (ref 0.00–0.07)
Basophils Absolute: 0.1 10*3/uL (ref 0.0–0.1)
Basophils Relative: 2 %
Eosinophils Absolute: 0.2 10*3/uL (ref 0.0–0.5)
Eosinophils Relative: 4 %
HCT: 36.1 % — ABNORMAL LOW (ref 39.0–52.0)
Hemoglobin: 11.8 g/dL — ABNORMAL LOW (ref 13.0–17.0)
Immature Granulocytes: 0 %
Lymphocytes Relative: 35 %
Lymphs Abs: 1.4 10*3/uL (ref 0.7–4.0)
MCH: 32.2 pg (ref 26.0–34.0)
MCHC: 32.7 g/dL (ref 30.0–36.0)
MCV: 98.4 fL (ref 80.0–100.0)
Monocytes Absolute: 0.3 10*3/uL (ref 0.1–1.0)
Monocytes Relative: 8 %
Neutro Abs: 2.1 10*3/uL (ref 1.7–7.7)
Neutrophils Relative %: 51 %
Platelets: 182 10*3/uL (ref 150–400)
RBC: 3.67 MIL/uL — ABNORMAL LOW (ref 4.22–5.81)
RDW: 18.1 % — ABNORMAL HIGH (ref 11.5–15.5)
WBC: 4.1 10*3/uL (ref 4.0–10.5)
nRBC: 0 % (ref 0.0–0.2)

## 2021-07-04 MED ORDER — SODIUM CHLORIDE 0.9 % IV SOLN
Freq: Once | INTRAVENOUS | Status: AC
  Filled 2021-07-04: qty 250

## 2021-07-04 MED ORDER — IRON SUCROSE 20 MG/ML IV SOLN
200.0000 mg | Freq: Once | INTRAVENOUS | Status: AC
  Administered 2021-07-04: 200 mg via INTRAVENOUS
  Filled 2021-07-04: qty 10

## 2021-07-04 MED ORDER — SODIUM CHLORIDE 0.9 % IV SOLN: 200.0000 mg | Freq: Once | INTRAVENOUS | Status: DC

## 2021-07-04 NOTE — Patient Instructions (Signed)

## 2021-07-04 NOTE — Progress Notes (Signed)
Pt here for follow up. No new concerns voiced.   

## 2021-07-04 NOTE — Progress Notes (Signed)
[name] ?CONSULT NOTE ? ?Patient Care Team: ?Idelle Crouch, MD as PCP - General (Internal Medicine) ? ?CHIEF COMPLAINTS/PURPOSE OF CONSULTATION: ANEMIA ? ?HEMATOLOGY HISTORY: ? ?# ANEMIA NORMOCYTIC  [JAN 2023- PCP-hemoglobin 8.7; saturation 7%; ferritin 23-LL-28]EGD/ colonoscopy-JAN 2023 [[facility]] ? ?# MARCH 2023-possible MGUS; IgG kappa 1.6 g; kappa/lambda light chain ratio 33 ? ?March 2022-PE small volume right lower lobe; and right lower extremity above-knee DVT.  On Eliquis. ? ? ?# Hx of PVD [Dr.Dew] ? ?HISTORY OF PRESENTING ILLNESS: Patient accompanied by his wife.  Ambulating independently.  Slightly hard of hearing ?Jonathon Barrera 81 y.o.  male is here to review the results of his blood work ordered for anemia.  Patient also received IV iron infusions for iron deficiency. ? ?Patient noted to have improvement of his energy levels.  Denies any blood in stools or black-colored stools. ? ? ?Review of Systems  ?Constitutional:  Positive for malaise/fatigue. Negative for chills, diaphoresis, fever and weight loss.  ?HENT:  Negative for nosebleeds and sore throat.   ?Eyes:  Negative for double vision.  ?Respiratory:  Negative for cough, hemoptysis, sputum production, shortness of breath and wheezing.   ?Cardiovascular:  Negative for chest pain, palpitations, orthopnea and leg swelling.  ?Gastrointestinal:  Negative for abdominal pain, blood in stool, constipation, diarrhea, heartburn, melena, nausea and vomiting.  ?Genitourinary:  Negative for dysuria, frequency and urgency.  ?Musculoskeletal:  Positive for joint pain. Negative for back pain.  ?Skin: Negative.  Negative for itching and rash.  ?Neurological:  Negative for dizziness, tingling, focal weakness, weakness and headaches.  ?Endo/Heme/Allergies:  Does not bruise/bleed easily.  ?Psychiatric/Behavioral:  Negative for depression. The patient is not nervous/anxious and does not have insomnia.   ? ?MEDICAL HISTORY:  ?Past Medical History:   ?Diagnosis Date  ? Actinic keratosis   ? Acute deep vein thrombosis (DVT) of distal vein of right lower extremity ([place]) 06/19/2020  ? Aortic atherosclerosis ([place])   ? Bladder carcinoma ([place]) 05/26/2007  ? a.) papillary-transitional cell  ? BPH (benign prostatic hyperplasia)   ? Colon polyps   ? COPD (chronic obstructive pulmonary disease) ([place])   ? Current use of long term anticoagulation   ? a.) Apixaban  ? Diverticulosis   ? DOE (dyspnea on exertion)   ? HOH (hard of hearing)   ? a.) uses BILATERAL assistive devices  ? Hyperlipemia   ? Hypertension   ? Mass   ? Lt lower cheek  ? Melanoma ([place]) 2000  ? Tx by Dr Koleen Nimrod  ? PAD (peripheral artery disease) ([place])   ? Peripheral vascular disease ([place])   ? Pulmonary embolism ([place]) 06/19/2020  ? a.) RLL; no associated RIGHT heart strain  ? RBBB (right bundle branch block)   ? Squamous cell carcinoma of skin 02/01/2017  ? Left dorsum latera. hand. EDC  ? Squamous cell carcinoma of skin 02/01/2017  ? Left dorsum base of thumb. EDC  ? T2DM (type 2 diabetes mellitus) ([place])   ? ? ?SURGICAL HISTORY: ?Past Surgical History:  ?Procedure Laterality Date  ? back skin cancer    ? BLADDER SURGERY    ? CARDIAC CATHETERIZATION    ? CATARACT EXTRACTION W/PHACO Right 04/30/2015  ? Procedure: CATARACT EXTRACTION PHACO AND INTRAOCULAR LENS PLACEMENT (IOC);  Surgeon: Birder Robson, MD;  Location: [facility] ORS;  Service: Ophthalmology;  Laterality: Right;  [place] 01:03 ?AP% 21.4 ?CDE  13.57 ?fluid pack lot # [id number]  ? CATARACT EXTRACTION W/PHACO Left 05/21/2015  ? Procedure: CATARACT EXTRACTION PHACO  AND INTRAOCULAR LENS PLACEMENT (IOC);  Surgeon: Birder Robson, MD;  Location: [facility] ORS;  Service: Ophthalmology;  Laterality: Left;  [place] 00:52 ?AP% 18.9 ?CDE 9.95 ?fluid pack lot #[id number]  ? COLONOSCOPY WITH PROPOFOL N/A 11/05/2014  ? Procedure: COLONOSCOPY WITH PROPOFOL;  Surgeon: Hulen Luster, MD;  Location: [facility] ENDOSCOPY;  Service: Gastroenterology;  Laterality: N/A;  ? COLONOSCOPY WITH  PROPOFOL N/A 05/19/2021  ? Procedure: COLONOSCOPY WITH PROPOFOL;  Surgeon: Annamaria Helling, DO;  Location: [facility] ENDOSCOPY;  Service: Gastroenterology;  Laterality: N/A;  DM Patient is hard of hearing and would like for wife to answer questions, please.  ? ENDARTERECTOMY FEMORAL Right 02/26/2021  ? Procedure: ENDARTERECTOMY FEMORAL (SFA STENT PLACEMEN);  Surgeon: Algernon Huxley, MD;  Location: [facility] ORS;  Service: Vascular;  Laterality: Right;  ? ESOPHAGOGASTRODUODENOSCOPY (EGD) WITH PROPOFOL N/A 05/19/2021  ? Procedure: ESOPHAGOGASTRODUODENOSCOPY (EGD) WITH PROPOFOL;  Surgeon: Annamaria Helling, DO;  Location: [facility] ENDOSCOPY;  Service: Gastroenterology;  Laterality: N/A;  ? HERNIA REPAIR    ? inguinal  ? KYPHOPLASTY N/A 02/23/2019  ? Procedure: T12 KYPHOPLASTY;  Surgeon: Hessie Knows, MD;  Location: [facility] ORS;  Service: Orthopedics;  Laterality: N/A;  ? LESION EXCISION N/A 02/23/2018  ? Procedure: EXCISION SCALP CYST AND FACIAL CYST;  Surgeon: Herbert Pun, MD;  Location: [facility] ORS;  Service: General;  Laterality: N/A;  ? LOWER EXTREMITY ANGIOGRAPHY Right 01/23/2021  ? Procedure: LOWER EXTREMITY ANGIOGRAPHY;  Surgeon: Algernon Huxley, MD;  Location: [facility];  Service: Cardiovascular;  Laterality: Right;  ? SHOULDER ARTHROSCOPY W/ ROTATOR CUFF REPAIR Right   ? TUR-BT    ? ? ?SOCIAL HISTORY: ?Social History  ? ?Socioeconomic History  ? Marital status: Married  ?  Spouse name: Not on file  ? Number of children: Not on file  ? Years of education: Not on file  ? Highest education level: Not on file  ?Occupational History  ? Not on file  ?Tobacco Use  ? Smoking status: Every Day  ?  Packs/day: 0.50  ?  Types: Cigarettes  ? Smokeless tobacco: Never  ?Vaping Use  ? Vaping Use: Never used  ?Substance and Sexual Activity  ? Alcohol use: Yes  ?  Alcohol/week: 6.0 standard drinks  ?  Types: 6 Cans of beer per week  ? Drug use: No  ? Sexual activity: Not on file  ?Other Topics Concern  ? Not on file  ?Social  History Narrative  ? Lives in [place]; with wife. 2 children/near by. Smokes 1/2 ppd; 3-4 beers/weekend. Retired in 2010; Systems analyst.   ? ?Social Determinants of Health  ? ?Financial Resource Strain: Not on file  ?Food Insecurity: Not on file  ?Transportation Needs: Not on file  ?Physical Activity: Not on file  ?Stress: Not on file  ?Social Connections: Not on file  ?Intimate Partner Violence: Not on file  ? ? ?FAMILY HISTORY: ?Family History  ?Problem Relation Age of Onset  ? Diabetes Mother   ? Colon cancer Mother   ? Diabetes Father   ? ? ?ALLERGIES:  has No Known Allergies. ? ?MEDICATIONS:  ?Current Outpatient Medications  ?Medication Sig Dispense Refill  ? Artificial Tear Solution (SOOTHE XP) SOLN Place 1 drop into both eyes 3 (three) times daily as needed (dry eyes).    ? aspirin 325 MG tablet Take by mouth.    ? ferrous sulfate 325 (65 FE) MG tablet Take 325 mg by mouth 2 (two) times daily with a meal.    ?  finasteride (PROSCAR) 5 MG tablet Take 5 mg by mouth daily.    ? glimepiride (AMARYL) 4 MG tablet Take 4 mg by mouth in the morning and at bedtime.    ? hydrochlorothiazide (HYDRODIURIL) 25 MG tablet Take 25 mg by mouth daily.    ? Misc Natural Products (URINOZINC PLUS PO) Take 1 tablet by mouth 2 (two) times daily.     ? pantoprazole (PROTONIX) 40 MG tablet Take 1 tablet by mouth 2 (two) times daily.    ? telmisartan (MICARDIS) 80 MG tablet Take 80 mg by mouth daily.    ? ELIQUIS 5 MG TABS tablet Take 5 mg by mouth 2 (two) times daily. (Patient not taking: Reported on 07/04/2021)    ? ?No current facility-administered medications for this visit.  ? ? ? ? ?PHYSICAL EXAMINATION: ? ? ?Vitals:  ? 07/04/21 1317  ?BP: 131/73  ?Pulse: 76  ?Resp: 18  ?Temp: 97.8 ?F (36.6 ?C)  ? ?Filed Weights  ? 07/04/21 1317  ?Weight: 185 lb (83.9 kg)  ? ? ?Physical Exam ?Vitals and nursing note reviewed.  ?HENT:  ?   Head: Normocephalic and atraumatic.  ?   Mouth/Throat:  ?   Pharynx: Oropharynx is  clear.  ?Eyes:  ?   Extraocular Movements: Extraocular movements intact.  ?   Pupils: Pupils are equal, round, and reactive to light.  ?Cardiovascular:  ?   Rate and Rhythm: Normal rate and regular rhythm.  ?Pulmon

## 2021-07-04 NOTE — Assessment & Plan Note (Addendum)
#  Moderate to severe anemia/likely iron deficiency- hemoglobin 8; MCV normal; platelets normal [FEB 2023; PCP]-ferritin-16 [LN-24; iron saturation 7%]; normal white count/platelets.  S/p IV Venofer-hemoglobin improved to 11.8.  Recommend 1 more infusion today. ? ? ?#Clinically suspected MGUS ELEVATED M protein: IgGK- 1.6 /dl; K/L= 33 [anemia-see above; no hypercalcemia no renal insufficiency; ?  Bone lesions-no x-rays done].  I discussed the importance of MGUS in relationship to multiple myeloma etc.  Discussed getting a bone marrow biopsy. Discussed with the patient the bone marrow biopsy and aspiration indication and procedure at length.  Given significant discomfort involved-I would recommend under anesthesia/with radiology in the hospital. I discussed the potential complications include-bleeding/trauma and risk of infection; which are fortunately very rare.  However for now we will recheck myeloma labs in 3 months-if progressive recommend a bone marrow biopsy.  Patient in agreement. ? ?# March 2022 [Dr.Sparks]-small volume PE/ RIGHT LE DVT- Positive for deep venous thrombosis in the right lower extremity [Above Knee- fem]; August 2022 repeat ultrasound-improved not resolved-on Eliquis. STABLE.  ? ?# Hx o PVD [s/p stenting]- on asprin [Dr.Dew] ? ?# DISPOSITION: ?# venofer today ?# follow up in 3 months 2 weeks prior-; MD; labs- cbc/bmp; MM panel; K/L light chains-/possible venofer-Dr.B ? ?[place]; Sparks ? ?

## 2021-07-06 ENCOUNTER — Encounter (INDEPENDENT_AMBULATORY_CARE_PROVIDER_SITE_OTHER): Payer: Self-pay | Admitting: Nurse Practitioner

## 2021-07-06 NOTE — Progress Notes (Signed)
? ?Subjective:  ? ? Patient ID: Jonathon Barrera, male    DOB: 1940-08-31, 81 y.o.   MRN: [id number] ?Chief Complaint  ?Patient presents with  ? Follow-up  ?  Ultrasound  ? ? ?The patient returns to the office for followup and review of the noninvasive studies. There have been no interval changes in lower extremity symptoms. No interval shortening of the patient's claudication distance or development of rest pain symptoms. No new ulcers or wounds have occurred since the last visit. ? ?There have been no significant changes to the patient's overall health care. ? ?The patient denies amaurosis fugax or recent TIA symptoms. There are no recent neurological changes noted. ?The patient denies history of DVT, PE or superficial thrombophlebitis. ?The patient denies recent episodes of angina or shortness of breath.  ? ?ABI Rt=1.15 and Lt=0.79  (previous ABI's Rt=1.23 and Lt=0.86) ?Duplex ultrasound of the right lower extremity reveals triphasic waveforms normal toe waveforms, with monophasic/biphasic waveforms in the left foot normal toe waveforms. ? ? ?Review of Systems  ?All other systems reviewed and are negative. ? ?   ?Objective:  ? Physical Exam ?Vitals reviewed.  ?HENT:  ?   Head: Normocephalic.  ?Cardiovascular:  ?   Rate and Rhythm: Normal rate.  ?   Pulses:     ?     Dorsalis pedis pulses are 1+ on the right side and 1+ on the left side.  ?     Posterior tibial pulses are 1+ on the right side and 1+ on the left side.  ?Pulmonary:  ?   Effort: Pulmonary effort is normal.  ?Skin: ?   General: Skin is warm and dry.  ?Neurological:  ?   Mental Status: He is alert and oriented to person, place, and time.  ?Psychiatric:     ?   Mood and Affect: Mood normal.     ?   Thought Content: Thought content normal.     ?   Judgment: Judgment normal.  ? ? ?BP 120/72 (BP Location: Left Arm)   Pulse 77   Resp 17   Ht '5\' 9"'$  (1.753 m)   Wt 186 lb 6.4 oz (84.6 kg)   BMI 27.53 kg/m?  ? ?Past Medical History:  ?Diagnosis Date  ?  Actinic keratosis   ? Acute deep vein thrombosis (DVT) of distal vein of right lower extremity ([place]) 06/19/2020  ? Aortic atherosclerosis ([place])   ? Bladder carcinoma ([place]) 05/26/2007  ? a.) papillary-transitional cell  ? BPH (benign prostatic hyperplasia)   ? Colon polyps   ? COPD (chronic obstructive pulmonary disease) ([place])   ? Current use of long term anticoagulation   ? a.) Apixaban  ? Diverticulosis   ? DOE (dyspnea on exertion)   ? HOH (hard of hearing)   ? a.) uses BILATERAL assistive devices  ? Hyperlipemia   ? Hypertension   ? Mass   ? Lt lower cheek  ? Melanoma ([place]) 2000  ? Tx by Dr Koleen Nimrod  ? PAD (peripheral artery disease) ([place])   ? Peripheral vascular disease ([place])   ? Pulmonary embolism ([place]) 06/19/2020  ? a.) RLL; no associated RIGHT heart strain  ? RBBB (right bundle branch block)   ? Squamous cell carcinoma of skin 02/01/2017  ? Left dorsum latera. hand. EDC  ? Squamous cell carcinoma of skin 02/01/2017  ? Left dorsum base of thumb. EDC  ? T2DM (type 2 diabetes mellitus) ([place])   ? ? ?Social History  ? ?Socioeconomic  History  ? Marital status: Married  ?  Spouse name: Not on file  ? Number of children: Not on file  ? Years of education: Not on file  ? Highest education level: Not on file  ?Occupational History  ? Not on file  ?Tobacco Use  ? Smoking status: Every Day  ?  Packs/day: 0.50  ?  Types: Cigarettes  ? Smokeless tobacco: Never  ?Vaping Use  ? Vaping Use: Never used  ?Substance and Sexual Activity  ? Alcohol use: Yes  ?  Alcohol/week: 6.0 standard drinks  ?  Types: 6 Cans of beer per week  ? Drug use: No  ? Sexual activity: Not on file  ?Other Topics Concern  ? Not on file  ?Social History Narrative  ? Lives in [place]; with wife. 2 children/near by. Smokes 1/2 ppd; 3-4 beers/weekend. Retired in 2010; Systems analyst.   ? ?Social Determinants of Health  ? ?Financial Resource Strain: Not on file  ?Food Insecurity: Not on file  ?Transportation Needs: Not on file   ?Physical Activity: Not on file  ?Stress: Not on file  ?Social Connections: Not on file  ?Intimate Partner Violence: Not on file  ? ? ?Past Surgical History:  ?Procedure Laterality Date  ? back skin cancer    ? BLADDER SURGERY    ? CARDIAC CATHETERIZATION    ? CATARACT EXTRACTION W/PHACO Right 04/30/2015  ? Procedure: CATARACT EXTRACTION PHACO AND INTRAOCULAR LENS PLACEMENT (IOC);  Surgeon: Birder Robson, MD;  Location: [facility] ORS;  Service: Ophthalmology;  Laterality: Right;  [place] 01:03 ?AP% 21.4 ?CDE  13.57 ?fluid pack lot # [id number]  ? CATARACT EXTRACTION W/PHACO Left 05/21/2015  ? Procedure: CATARACT EXTRACTION PHACO AND INTRAOCULAR LENS PLACEMENT (IOC);  Surgeon: Birder Robson, MD;  Location: [facility] ORS;  Service: Ophthalmology;  Laterality: Left;  [place] 00:52 ?AP% 18.9 ?CDE 9.95 ?fluid pack lot #[id number]  ? COLONOSCOPY WITH PROPOFOL N/A 11/05/2014  ? Procedure: COLONOSCOPY WITH PROPOFOL;  Surgeon: Hulen Luster, MD;  Location: [facility] ENDOSCOPY;  Service: Gastroenterology;  Laterality: N/A;  ? COLONOSCOPY WITH PROPOFOL N/A 05/19/2021  ? Procedure: COLONOSCOPY WITH PROPOFOL;  Surgeon: Annamaria Helling, DO;  Location[facility] ENDOSCOPY;  Service: Gastroenterology;  Laterality: N/A;  DM Patient is hard of hearing and would like for wife to answer questions, please.  ? ENDARTERECTOMY FEMORAL Right 02/26/2021  ? Procedure: ENDARTERECTOMY FEMORAL (SFA STENT PLACEMEN);  Surgeon: Algernon Huxley, MD;  Location: [facility] ORS;  Service: Vascular;  Laterality: Right;  ? ESOPHAGOGASTRODUODENOSCOPY (EGD) WITH PROPOFOL N/A 05/19/2021  ? Procedure: ESOPHAGOGASTRODUODENOSCOPY (EGD) WITH PROPOFOL;  Surgeon: Annamaria Helling, DO;  Location: [facility] ENDOSCOPY;  Service: Gastroenterology;  Laterality: N/A;  ? HERNIA REPAIR    ? inguinal  ? KYPHOPLASTY N/A 02/23/2019  ? Procedure: T12 KYPHOPLASTY;  Surgeon: Hessie Knows, MD;  Location: [facility] ORS;  Service: Orthopedics;  Laterality: N/A;  ? LESION EXCISION N/A 02/23/2018  ? Procedure: EXCISION SCALP  CYST AND FACIAL CYST;  Surgeon: Herbert Pun, MD;  Location: [facility] ORS;  Service: General;  Laterality: N/A;  ? LOWER EXTREMITY ANGIOGRAPHY Right 01/23/2021  ? Procedure: LOWER EXTREMITY ANGIOGRAPHY;  Surgeon: Algernon Huxley, MD;  Location: [facility];  Service: Cardiovascular;  Laterality: Right;  ? SHOULDER ARTHROSCOPY W/ ROTATOR CUFF REPAIR Right   ? TUR-BT    ? ? ?Family History  ?Problem Relation Age of Onset  ? Diabetes Mother   ? Colon cancer Mother   ? Diabetes Father   ? ? ?  No Known Allergies ? ?CBC Latest Ref Rng & Units 07/04/2021 06/11/2021 02/27/2021  ?WBC 4.0 - 10.5 K/uL 4.1 4.6 4.9  ?Hemoglobin 13.0 - 17.0 g/dL 11.8(L) 10.1(L) 10.0(L)  ?Hematocrit 39.0 - 52.0 % 36.1(L) 32.9(L) 30.2(L)  ?Platelets 150 - 400 K/uL 182 203 115(L)  ? ? ? ? ?CMP  ?   ?Component Value Date/Time  ? NA 135 06/11/2021 1225  ? K 3.7 06/11/2021 1225  ? K 3.9 02/23/2012 0928  ? CL 99 06/11/2021 1225  ? CO2 28 06/11/2021 1225  ? GLUCOSE 150 (H) 06/11/2021 1225  ? BUN 21 06/11/2021 1225  ? CREATININE 0.97 06/11/2021 1225  ? CALCIUM 9.5 06/11/2021 1225  ? PROT 8.4 (H) 06/11/2021 1225  ? ALBUMIN 4.2 06/11/2021 1225  ? AST 27 06/11/2021 1225  ? ALT 30 06/11/2021 1225  ? ALKPHOS 79 06/11/2021 1225  ? BILITOT 0.4 06/11/2021 1225  ? GFRNONAA >60 06/11/2021 1225  ? ? ? ?No results found. ? ?   ?Assessment & Plan:  ? ?1. Tobacco abuse ?Smoking cessation was discussed, 3-10 minutes spent on this topic specifically  ? ?2. Type II diabetes mellitus with complication (HCC) ?Continue hypoglycemic medications as already ordered, these medications have been reviewed and there are no changes at this time. ? ?Hgb A1C to be monitored as already arranged by primary service  ? ?3. PAD (peripheral artery disease) ([place]) ? Recommend: ? ?The patient has evidence of atherosclerosis of the lower extremities with claudication.  The patient does not voice lifestyle limiting changes at this point in time. ? ?Noninvasive studies do not suggest  clinically significant change. ? ?No invasive studies, angiography or surgery at this time ?The patient should continue walking and begin a more formal exercise program.  ?The patient should continue antiplatelet therapy and

## 2021-07-23 DIAGNOSIS — H6123 Impacted cerumen, bilateral: Secondary | ICD-10-CM | POA: Diagnosis not present

## 2021-07-23 DIAGNOSIS — H903 Sensorineural hearing loss, bilateral: Secondary | ICD-10-CM | POA: Diagnosis not present

## 2021-07-24 DIAGNOSIS — G245 Blepharospasm: Secondary | ICD-10-CM | POA: Diagnosis not present

## 2021-08-12 DIAGNOSIS — D500 Iron deficiency anemia secondary to blood loss (chronic): Secondary | ICD-10-CM | POA: Diagnosis not present

## 2021-08-12 DIAGNOSIS — K2100 Gastro-esophageal reflux disease with esophagitis, without bleeding: Secondary | ICD-10-CM | POA: Diagnosis not present

## 2021-08-12 DIAGNOSIS — K2980 Duodenitis without bleeding: Secondary | ICD-10-CM | POA: Diagnosis not present

## 2021-08-12 DIAGNOSIS — K222 Esophageal obstruction: Secondary | ICD-10-CM | POA: Diagnosis not present

## 2021-08-12 DIAGNOSIS — Z86010 Personal history of colonic polyps: Secondary | ICD-10-CM | POA: Diagnosis not present

## 2021-08-12 DIAGNOSIS — K5520 Angiodysplasia of colon without hemorrhage: Secondary | ICD-10-CM | POA: Diagnosis not present

## 2021-08-22 DIAGNOSIS — I10 Essential (primary) hypertension: Secondary | ICD-10-CM | POA: Diagnosis not present

## 2021-08-22 DIAGNOSIS — Z125 Encounter for screening for malignant neoplasm of prostate: Secondary | ICD-10-CM | POA: Diagnosis not present

## 2021-08-22 DIAGNOSIS — J431 Panlobular emphysema: Secondary | ICD-10-CM | POA: Diagnosis not present

## 2021-08-22 DIAGNOSIS — J449 Chronic obstructive pulmonary disease, unspecified: Secondary | ICD-10-CM | POA: Diagnosis not present

## 2021-08-22 DIAGNOSIS — E782 Mixed hyperlipidemia: Secondary | ICD-10-CM | POA: Diagnosis not present

## 2021-08-22 DIAGNOSIS — E118 Type 2 diabetes mellitus with unspecified complications: Secondary | ICD-10-CM | POA: Diagnosis not present

## 2021-08-22 DIAGNOSIS — Z79899 Other long term (current) drug therapy: Secondary | ICD-10-CM | POA: Diagnosis not present

## 2021-09-19 ENCOUNTER — Inpatient Hospital Stay: Payer: PPO | Attending: Internal Medicine

## 2021-09-19 DIAGNOSIS — D509 Iron deficiency anemia, unspecified: Secondary | ICD-10-CM | POA: Diagnosis not present

## 2021-09-19 DIAGNOSIS — Z79899 Other long term (current) drug therapy: Secondary | ICD-10-CM | POA: Diagnosis not present

## 2021-09-19 DIAGNOSIS — Z86718 Personal history of other venous thrombosis and embolism: Secondary | ICD-10-CM | POA: Diagnosis not present

## 2021-09-19 DIAGNOSIS — D472 Monoclonal gammopathy: Secondary | ICD-10-CM | POA: Insufficient documentation

## 2021-09-19 DIAGNOSIS — D649 Anemia, unspecified: Secondary | ICD-10-CM

## 2021-09-19 DIAGNOSIS — D696 Thrombocytopenia, unspecified: Secondary | ICD-10-CM | POA: Insufficient documentation

## 2021-09-19 LAB — CBC WITH DIFFERENTIAL/PLATELET
Abs Immature Granulocytes: 0.03 10*3/uL (ref 0.00–0.07)
Basophils Absolute: 0.1 10*3/uL (ref 0.0–0.1)
Basophils Relative: 2 %
Eosinophils Absolute: 0.1 10*3/uL (ref 0.0–0.5)
Eosinophils Relative: 3 %
HCT: 38.2 % — ABNORMAL LOW (ref 39.0–52.0)
Hemoglobin: 12.8 g/dL — ABNORMAL LOW (ref 13.0–17.0)
Immature Granulocytes: 1 %
Lymphocytes Relative: 30 %
Lymphs Abs: 1.2 10*3/uL (ref 0.7–4.0)
MCH: 32 pg (ref 26.0–34.0)
MCHC: 33.5 g/dL (ref 30.0–36.0)
MCV: 95.5 fL (ref 80.0–100.0)
Monocytes Absolute: 0.4 10*3/uL (ref 0.1–1.0)
Monocytes Relative: 9 %
Neutro Abs: 2.3 10*3/uL (ref 1.7–7.7)
Neutrophils Relative %: 55 %
Platelets: 124 10*3/uL — ABNORMAL LOW (ref 150–400)
RBC: 4 MIL/uL — ABNORMAL LOW (ref 4.22–5.81)
RDW: 13.6 % (ref 11.5–15.5)
WBC: 4.1 10*3/uL (ref 4.0–10.5)
nRBC: 0 % (ref 0.0–0.2)

## 2021-09-19 LAB — BASIC METABOLIC PANEL
Anion gap: 6 (ref 5–15)
BUN: 23 mg/dL (ref 8–23)
CO2: 29 mmol/L (ref 22–32)
Calcium: 9 mg/dL (ref 8.9–10.3)
Chloride: 104 mmol/L (ref 98–111)
Creatinine, Ser: 1.06 mg/dL (ref 0.61–1.24)
GFR, Estimated: >60 mL/min (ref >60)
Glucose, Bld: 127 mg/dL — ABNORMAL HIGH (ref 70–99)
Potassium: 4 mmol/L (ref 3.5–5.1)
Sodium: 139 mmol/L (ref 135–145)

## 2021-09-22 LAB — KAPPA/LAMBDA LIGHT CHAINS
Kappa free light chain: 246.9 mg/L — ABNORMAL HIGH (ref 3.3–19.4)
Kappa, lambda light chain ratio: 27.74 — ABNORMAL HIGH (ref 0.26–1.65)
Lambda free light chains: 8.9 mg/L (ref 5.7–26.3)

## 2021-09-25 LAB — MULTIPLE MYELOMA PANEL, SERUM
Albumin SerPl Elph-Mcnc: 3.6 g/dL (ref 2.9–4.4)
Albumin/Glob SerPl: 1.1 (ref 0.7–1.7)
Alpha 1: 0.1 g/dL (ref 0.0–0.4)
Alpha2 Glob SerPl Elph-Mcnc: 0.7 g/dL (ref 0.4–1.0)
B-Globulin SerPl Elph-Mcnc: 0.8 g/dL (ref 0.7–1.3)
Gamma Glob SerPl Elph-Mcnc: 1.6 g/dL (ref 0.4–1.8)
Globulin, Total: 3.3 g/dL (ref 2.2–3.9)
IgA: 68 mg/dL (ref 61–437)
IgG (Immunoglobin G), Serum: 1766 mg/dL — ABNORMAL HIGH (ref 603–1613)
IgM (Immunoglobulin M), Srm: 155 mg/dL — ABNORMAL HIGH (ref 15–143)
M Protein SerPl Elph-Mcnc: 1.4 g/dL — ABNORMAL HIGH
Total Protein ELP: 6.9 g/dL (ref 6.0–8.5)

## 2021-09-30 ENCOUNTER — Inpatient Hospital Stay: Payer: PPO | Admitting: Internal Medicine

## 2021-09-30 ENCOUNTER — Inpatient Hospital Stay: Payer: PPO

## 2021-10-02 ENCOUNTER — Encounter: Payer: Self-pay | Admitting: Internal Medicine

## 2021-10-02 ENCOUNTER — Telehealth: Payer: Self-pay

## 2021-10-02 ENCOUNTER — Ambulatory Visit
Admission: RE | Admit: 2021-10-02 | Discharge: 2021-10-02 | Disposition: A | Discharge status: Home / Self Care | Payer: PPO | Source: Ambulatory Visit | Attending: Internal Medicine | Admitting: Internal Medicine

## 2021-10-02 ENCOUNTER — Inpatient Hospital Stay: Payer: PPO

## 2021-10-02 ENCOUNTER — Inpatient Hospital Stay: Payer: PPO | Admitting: Internal Medicine

## 2021-10-02 VITALS — BP 131/78 | HR 72 | Temp 98.1°F | Ht 69.0 in | Wt 185.0 lb

## 2021-10-02 DIAGNOSIS — D649 Anemia, unspecified: Secondary | ICD-10-CM

## 2021-10-02 DIAGNOSIS — I82411 Acute embolism and thrombosis of right femoral vein: Secondary | ICD-10-CM | POA: Diagnosis not present

## 2021-10-02 DIAGNOSIS — I824Z1 Acute embolism and thrombosis of unspecified deep veins of right distal lower extremity: Secondary | ICD-10-CM

## 2021-10-02 DIAGNOSIS — D472 Monoclonal gammopathy: Secondary | ICD-10-CM | POA: Diagnosis not present

## 2021-10-02 DIAGNOSIS — M7989 Other specified soft tissue disorders: Secondary | ICD-10-CM | POA: Diagnosis not present

## 2021-10-02 DIAGNOSIS — I82431 Acute embolism and thrombosis of right popliteal vein: Secondary | ICD-10-CM | POA: Diagnosis not present

## 2021-10-02 MED ORDER — APIXABAN (ELIQUIS) VTE STARTER PACK (10MG AND 5MG): ORAL_TABLET | ORAL | 0 refills | Status: DC

## 2021-10-02 NOTE — Telephone Encounter (Signed)
Message received from Dr. B:  I spoke to patient Wife regarding the blood clot. Please send a prescription for Eliquis starter pack. And ask them to stop taking the Aspirin. Follow up with nurse practitioner in one month. Keep appointments with me as planned.

## 2021-10-02 NOTE — Assessment & Plan Note (Addendum)
#  Moderate to severe anemia/likely iron deficiency- hemoglobin 8; MCV normal; platelets normal [FEB 2023; PCP]-ferritin-16 [LN-24; iron saturation 7%]; normal white count/platelets.  S/p IV Venofer-  # hemoglobin improved to 12.8.  Hold IV iron infusion.  Recommend gentle iron Twice a day  # ETIOLOGY of IDA: ?  Is unclear.  Consider CT abdomen pelvis/capsule study if ongoing anemia noted.  # MGUS:  ELEVATED M protein:MARCH 2023- IgGK- 1.6 /dl; K/L= 33 [anemia-see above; no hypercalcemia no renal insufficiency; ?  Bone lesions-no x-rays done]. JUNE 2023- M 1.4; K/L ratio: 29 ; overall stable.  HOLD bone marrow biopsy.  Patient in agreement.  # March 2022 [Dr.Sparks]-small volume PE/ RIGHT LE DVT- Positive for deep venous thrombosis in the right lower extremity [Above Knee- fem]; August 2022 repeat ultrasound-improved not resolved. OFF Eliquis [since March].  However given worsening symptoms-recommend reevaluation with Dopplers.  #Intermittent thrombocytopenia isolated 124 mild monitor for now-while on Eliquis.  # Hx o PVD [s/p stenting]- on asprin [Dr.Dew]  # DISPOSITION: # STAT US dopplers-  # NO venofer today # follow up in 4 months 2 weeks prior-; MD; labs- cbc/bmp; MM panel; K/L light chains; iro studies; ferritin-/possible venofer-Dr.B  Addendum: October 02, 2021 DVT-femoral/popliteal noted. Recommend starting Eliquis-starter dose.  Follow-up in 1 month.  Given recurrent DVT; prior history of PE-patient likely need long-term anticoagulation.  Recommend holding aspirin.   [place]; Sparks

## 2021-10-02 NOTE — Telephone Encounter (Signed)
Confirmed with patient's wife that she did speak with Dr. Jacinto Reap.  Informed her the rx for Eliquis starter pack sent to pharmacy, stop ASA, and given 1 month f/u appt details.

## 2021-10-02 NOTE — Patient Instructions (Signed)
#  Recommend gentle iron 1 pill twice a day. It  should not upset your stomach or cause constipation.  Talk to the pharmacist if you can find it/it is over-the-counter.

## 2021-10-02 NOTE — Progress Notes (Signed)
[facility] NOTE  Patient Care Team: Idelle Crouch, MD as PCP - General (Internal Medicine) Cammie Sickle, MD as Consulting Physician (Oncology)  CHIEF COMPLAINTS/PURPOSE OF CONSULTATION: ANEMIA  HEMATOLOGY HISTORY:  # ANEMIA NORMOCYTIC  [JAN 2023- PCP-hemoglobin 8.7; saturation 7%; ferritin 23-LL-28]EGD/ colonoscopy-JAN 2023 [[facility]]; NO CT-A/P or capsule.   # MARCH 2023-possible MGUS; IgG kappa 1.6 g; kappa/lambda light chain ratio 33  March 2022-PE small volume right lower lobe; and right lower extremity above-knee DVT.  On Eliquis; OFF in March 2023; JUNE 15th-DVT-femoral/popliteal nonocclusive-started on Eliquis  # Hx of PVD [Dr.Dew]   Latest Reference Range & Units Most Recent 06/11/21 12:25 09/19/21 10:53  Kappa free light chain 3.3 - 19.4 mg/L 246.9 (H) 09/19/21 10:53 351.3 (H) 246.9 (H)  Lambda free light chains 5.7 - 26.3 mg/L 8.9 09/19/21 10:53 10.5 8.9  Kappa, lambda light chain ratio 0.26 - 1.65  27.74 (H) 09/19/21 10:53 33.46 (H) 27.74 (H)  (H): Data is abnormally high   HISTORY OF PRESENTING ILLNESS: Patient accompanied by his wife.  Ambulating independently.  Slightly hard of hearing.  Jonathon Barrera 81 y.o.  male iron deficient anemia-unclear etiology; history of DVT/PE-MGUS is here IV iron infusions for iron deficiency.  Pt having rt calf pain, has had clot in this leg before.  Notes leg cramping pain the last few weeks.  He is currently on iron sulfate twice a day.  Mild constipation.  Following she takes stool softener  Patient noted to have improvement of his energy levels.     Review of Systems  Constitutional:  Positive for malaise/fatigue. Negative for chills, diaphoresis, fever and weight loss.  HENT:  Negative for nosebleeds and sore throat.   Eyes:  Negative for double vision.  Respiratory:  Negative for cough, hemoptysis, sputum production, shortness of breath and wheezing.   Cardiovascular:  Negative for chest pain,  palpitations, orthopnea and leg swelling.  Gastrointestinal:  Negative for abdominal pain, blood in stool, constipation, diarrhea, heartburn, melena, nausea and vomiting.  Genitourinary:  Negative for dysuria, frequency and urgency.  Musculoskeletal:  Positive for joint pain. Negative for back pain.  Skin: Negative.  Negative for itching and rash.  Neurological:  Negative for dizziness, tingling, focal weakness, weakness and headaches.  Endo/Heme/Allergies:  Does not bruise/bleed easily.  Psychiatric/Behavioral:  Negative for depression. The patient is not nervous/anxious and does not have insomnia.     MEDICAL HISTORY:  Past Medical History:  Diagnosis Date   Actinic keratosis    Acute deep vein thrombosis (DVT) of distal vein of right lower extremity ([place]) 06/19/2020   Aortic atherosclerosis (HCC)    Bladder carcinoma ([place]) 05/26/2007   a.) papillary-transitional cell   BPH (benign prostatic hyperplasia)    Colon polyps    COPD (chronic obstructive pulmonary disease) (HCC)    Current use of long term anticoagulation    a.) Apixaban   Diverticulosis    DOE (dyspnea on exertion)    HOH (hard of hearing)    a.) uses BILATERAL assistive devices   Hyperlipemia    Hypertension    Mass    Lt lower cheek   Melanoma ([place]) 2000   Tx by Dr Koleen Nimrod   PAD (peripheral artery disease) [facility])    Peripheral vascular disease ([place])    Pulmonary embolism ([place]) 06/19/2020   a.) RLL; no associated RIGHT heart strain   RBBB (right bundle branch block)    Squamous cell carcinoma of skin 02/01/2017   Left dorsum  latera. hand. EDC   Squamous cell carcinoma of skin 02/01/2017   Left dorsum base of thumb. EDC   T2DM (type 2 diabetes mellitus) ([place])     SURGICAL HISTORY: Past Surgical History:  Procedure Laterality Date   back skin cancer     BLADDER SURGERY     CARDIAC CATHETERIZATION     CATARACT EXTRACTION W/PHACO Right 04/30/2015   Procedure: CATARACT EXTRACTION PHACO AND INTRAOCULAR  LENS PLACEMENT (IOC);  Surgeon: Birder Robson, MD;  Location: [facility] ORS;  Service: Ophthalmology;  Laterality: Right;  [place] 01:03 AP% 21.4 CDE  13.57 fluid pack lot # [id number]   CATARACT EXTRACTION W/PHACO Left 05/21/2015   Procedure: CATARACT EXTRACTION PHACO AND INTRAOCULAR LENS PLACEMENT (IOC);  Surgeon: Birder Robson, MD;  Location: [facility] ORS;  Service: Ophthalmology;  Laterality: Left;  [place] 00:52 AP% 18.9 CDE 9.95 fluid pack lot #[id number]   COLONOSCOPY WITH PROPOFOL N/A 11/05/2014   Procedure: COLONOSCOPY WITH PROPOFOL;  Surgeon: Hulen Luster, MD;  Location: [facility] ENDOSCOPY;  Service: Gastroenterology;  Laterality: N/A;   COLONOSCOPY WITH PROPOFOL N/A 05/19/2021   Procedure: COLONOSCOPY WITH PROPOFOL;  Surgeon: Annamaria Helling, DO;  Location: [facility] ENDOSCOPY;  Service: Gastroenterology;  Laterality: N/A;  DM Patient is hard of hearing and would like for wife to answer questions, please.   ENDARTERECTOMY FEMORAL Right 02/26/2021   Procedure: ENDARTERECTOMY FEMORAL (SFA STENT PLACEMEN);  Surgeon: Algernon Huxley, MD;  Location: [facility] ORS;  Service: Vascular;  Laterality: Right;   ESOPHAGOGASTRODUODENOSCOPY (EGD) WITH PROPOFOL N/A 05/19/2021   Procedure: ESOPHAGOGASTRODUODENOSCOPY (EGD) WITH PROPOFOL;  Surgeon: Annamaria Helling, DO;  Location: [facility];  Service: Gastroenterology;  Laterality: N/A;   HERNIA REPAIR     inguinal   KYPHOPLASTY N/A 02/23/2019   Procedure: T12 KYPHOPLASTY;  Surgeon: Hessie Knows, MD;  Location: [facility] ORS;  Service: Orthopedics;  Laterality: N/A;   LESION EXCISION N/A 02/23/2018   Procedure: EXCISION SCALP CYST AND FACIAL CYST;  Surgeon: Herbert Pun, MD;  Location: [facility] ORS;  Service: General;  Laterality: N/A;   LOWER EXTREMITY ANGIOGRAPHY Right 01/23/2021   Procedure: LOWER EXTREMITY ANGIOGRAPHY;  Surgeon: Algernon Huxley, MD;  Location: [facility];  Service: Cardiovascular;  Laterality: Right;   SHOULDER ARTHROSCOPY W/ ROTATOR CUFF REPAIR  Right    TUR-BT      SOCIAL HISTORY: Social History   Socioeconomic History   Marital status: Married    Spouse name: Not on file   Number of children: Not on file   Years of education: Not on file   Highest education level: Not on file  Occupational History   Not on file  Tobacco Use   Smoking status: Every Day    Packs/day: 0.50    Types: Cigarettes   Smokeless tobacco: Never  Vaping Use   Vaping Use: Never used  Substance and Sexual Activity   Alcohol use: Yes    Alcohol/week: 6.0 standard drinks of alcohol    Types: 6 Cans of beer per week   Drug use: No   Sexual activity: Not on file  Other Topics Concern   Not on file  Social History Narrative   Lives in [place]; with wife. 2 children/near by. Smokes 1/2 ppd; 3-4 beers/weekend. Retired in 2010; Systems analyst.    Social Determinants of Health   Financial Resource Strain: Not on file  Food Insecurity: Not on file  Transportation Needs: Not on file  Physical Activity: Not on file  Stress: Not on file  Social Connections: Not on file  Intimate Partner Violence: Not on file    FAMILY HISTORY: Family History  Problem Relation Age of [place]   Diabetes Mother    Colon cancer Mother    Diabetes Father     ALLERGIES:  has No Known Allergies.  MEDICATIONS:  Current Outpatient Medications  Medication Sig Dispense Refill   Artificial Tear Solution (SOOTHE XP) SOLN Place 1 drop into both eyes 3 (three) times daily as needed (dry eyes).     aspirin 325 MG tablet Take by mouth.     ferrous sulfate 325 (65 FE) MG tablet Take 325 mg by mouth 2 (two) times daily with a meal.     finasteride (PROSCAR) 5 MG tablet Take 5 mg by mouth daily.     glimepiride (AMARYL) 4 MG tablet Take 4 mg by mouth in the morning and at bedtime.     hydrochlorothiazide (HYDRODIURIL) 25 MG tablet Take 25 mg by mouth daily.     JANUVIA 100 MG tablet Take 100 mg by mouth daily.     Misc Natural Products (URINOZINC PLUS  PO) Take 1 tablet by mouth 2 (two) times daily.      pantoprazole (PROTONIX) 40 MG tablet Take 1 tablet by mouth 2 (two) times daily.     telmisartan (MICARDIS) 80 MG tablet Take 80 mg by mouth daily.     APIXABAN (ELIQUIS) VTE STARTER PACK (10MG AND 5MG) Take as directed on package: start with two-22m tablets twice daily for 7 days. On day 8, switch to one-529mtablet twice daily. 1 each 0   No current facility-administered medications for this visit.      PHYSICAL EXAMINATION:   Vitals:   10/02/21 1047  BP: 131/78  Pulse: 72  Temp: 98.1 F (36.7 C)  SpO2: 99%   Filed Weights   10/02/21 1047  Weight: 185 lb (83.9 kg)    Physical Exam Vitals and nursing note reviewed.  HENT:     Head: Normocephalic and atraumatic.     Mouth/Throat:     Pharynx: Oropharynx is clear.  Eyes:     Extraocular Movements: Extraocular movements intact.     Pupils: Pupils are equal, round, and reactive to light.  Cardiovascular:     Rate and Rhythm: Normal rate and regular rhythm.  Pulmonary:     Comments: Decreased breath sounds bilaterally.  Abdominal:     Palpations: Abdomen is soft.  Musculoskeletal:        General: Normal range of motion.     Cervical back: Normal range of motion.  Skin:    General: Skin is warm.  Neurological:     General: No focal deficit present.     Mental Status: He is alert and oriented to person, place, and time.  Psychiatric:        Behavior: Behavior normal.        Judgment: Judgment normal.     LABORATORY DATA:  I have reviewed the data as listed Lab Results  Component Value Date   WBC 4.1 09/19/2021   HGB 12.8 (L) 09/19/2021   HCT 38.2 (L) 09/19/2021   MCV 95.5 09/19/2021   PLT 124 (L) 09/19/2021   Recent Labs    02/27/21 0041 06/11/21 1225 09/19/21 1053  NA 135 135 139  K 3.8 3.7 4.0  CL 107 99 104  CO2 _0 GLUCOSE 215* 150* 127*  BUN _1 CREATININE 1.01 0.97 1.06  CALCIUM 8.0* 9.5 9.0  GFRNONAA >60 >60 >60  PROT  --   8.4*  --   ALBUMIN  --  4.2  --   AST  --  27  --   ALT  --  30  --   ALKPHOS  --  79  --   BILITOT  --  0.4  --      US Venous Img Lower Unilateral Right (DVT)  Result Date: 10/02/2021 CLINICAL DATA:  RIGHT LE Swelling/pain; prior Hx of DVT. EXAM: RIGHT LOWER EXTREMITY VENOUS DOPPLER ULTRASOUND TECHNIQUE: Gray-scale sonography with graded compression, as well as color Doppler and duplex ultrasound were performed to evaluate the lower extremity deep venous systems from the level of the common femoral vein and including the common femoral, femoral, profunda femoral, popliteal and calf veins including the posterior tibial, peroneal and gastrocnemius veins when visible. The superficial great saphenous vein was also interrogated. Spectral Doppler was utilized to evaluate flow at rest and with distal augmentation maneuvers in the common femoral, femoral and popliteal veins. COMPARISON:  None Available. FINDINGS: Contralateral Common Femoral Vein: Respiratory phasicity is normal and symmetric with the symptomatic side. No evidence of thrombus. Normal compressibility. Common Femoral Vein: No evidence of thrombus. Normal compressibility, respiratory phasicity and response to augmentation. Saphenofemoral Junction: No evidence of thrombus. Normal compressibility and flow on color Doppler imaging. Profunda Femoral Vein: No evidence of thrombus. Normal compressibility and flow on color Doppler imaging. Femoral Vein: Nonocclusive thrombus with noncompressibility. Popliteal Vein: Nonocclusive thrombus with noncompressibility. Calf Veins: No evidence of thrombus. Normal compressibility and flow on color Doppler imaging. IMPRESSION: Nonocclusive thrombus within the right femoral vein and popliteal vein. These results will be called to the ordering clinician or representative by the Radiologist Assistant, and communication documented in the PACS or [employer]. Electronically Signed   By: Margaretha Sheffield M.D.   On:  10/02/2021 14:00    Symptomatic anemia # Moderate to severe anemia/likely iron deficiency- hemoglobin 8; MCV normal; platelets normal [FEB 2023; PCP]-ferritin-16 [LN-24; iron saturation 7%]; normal white count/platelets.  S/p IV Venofer-  # hemoglobin improved to 12.8.  Hold IV iron infusion.  Recommend gentle iron Twice a day  # ETIOLOGY of IDA: ?  Is unclear.  Consider capsule study if ongoing anemia noted.  # MGUS:  ELEVATED M protein:MARCH 2023- IgGK- 1.6 /dl; K/L= 33 [anemia-see above; no hypercalcemia no renal insufficiency; ?  Bone lesions-no x-rays done]. JUNE 2023- M 1.4; K/L ratio: 29 ; overall stable.  HOLD bone marrow biopsy.  Patient in agreement.  # March 2022 [Dr.Sparks]-small volume PE/ RIGHT LE DVT- Positive for deep venous thrombosis in the right lower extremity [Above Knee- fem]; August 2022 repeat ultrasound-improved not resolved. OFF Eliquis [since March].  However given worsening symptoms-recommend reevaluation with Dopplers.  #Intermittent thrombocytopenia isolated 124 mild monitor for now-while on Eliquis.  # Hx o PVD [s/p stenting]- on asprin [Dr.Dew]  # DISPOSITION: # STAT US dopplers-  # NO venofer today # follow up in 4 months 2 weeks prior-; MD; labs- cbc/bmp; MM panel; K/L light chains; iro studies; ferritin-/possible venofer-Dr.B  Addendum: October 02, 2021 DVT-femoral/popliteal noted. Recommend starting Eliquis-starter dose.  Follow-up in 1 month.  Given recurrent DVT; prior history of PE-patient likely need long-term anticoagulation.  Recommend holding aspirin.   [place]; [place]   All questions were answered. The patient knows to call the clinic with any problems, questions or concerns.    Cammie Sickle, MD 10/02/2021 4:34 PM

## 2021-10-02 NOTE — Progress Notes (Signed)
Pt having rt calf pain, has had clot in this leg before.

## 2021-10-22 ENCOUNTER — Telehealth: Payer: Self-pay | Admitting: Internal Medicine

## 2021-10-22 NOTE — Telephone Encounter (Signed)
Patients wife is on the phone and states he has a blood clot in his leg and Dr. B knows that. She states it is painful for him to stand. It doesn't hurt when he is sitting. Please advise.

## 2021-10-30 ENCOUNTER — Ambulatory Visit: Payer: PPO | Admitting: Medical Oncology

## 2021-10-30 ENCOUNTER — Inpatient Hospital Stay: Payer: PPO | Attending: Internal Medicine | Admitting: Nurse Practitioner

## 2021-10-30 VITALS — BP 126/78 | HR 79 | Temp 98.1°F | Resp 16 | Wt 182.5 lb

## 2021-10-30 DIAGNOSIS — I824Z1 Acute embolism and thrombosis of unspecified deep veins of right distal lower extremity: Secondary | ICD-10-CM

## 2021-10-30 DIAGNOSIS — I10 Essential (primary) hypertension: Secondary | ICD-10-CM | POA: Diagnosis not present

## 2021-10-30 DIAGNOSIS — D649 Anemia, unspecified: Secondary | ICD-10-CM

## 2021-10-30 DIAGNOSIS — D696 Thrombocytopenia, unspecified: Secondary | ICD-10-CM | POA: Diagnosis not present

## 2021-10-30 DIAGNOSIS — Z86711 Personal history of pulmonary embolism: Secondary | ICD-10-CM | POA: Diagnosis not present

## 2021-10-30 DIAGNOSIS — Z86718 Personal history of other venous thrombosis and embolism: Secondary | ICD-10-CM | POA: Diagnosis not present

## 2021-10-30 DIAGNOSIS — Z7901 Long term (current) use of anticoagulants: Secondary | ICD-10-CM | POA: Insufficient documentation

## 2021-10-30 DIAGNOSIS — Z79899 Other long term (current) drug therapy: Secondary | ICD-10-CM | POA: Diagnosis not present

## 2021-10-30 DIAGNOSIS — E1151 Type 2 diabetes mellitus with diabetic peripheral angiopathy without gangrene: Secondary | ICD-10-CM | POA: Insufficient documentation

## 2021-10-30 DIAGNOSIS — D472 Monoclonal gammopathy: Secondary | ICD-10-CM | POA: Insufficient documentation

## 2021-10-30 MED ORDER — APIXABAN 5 MG PO TABS: 5.0000 mg | ORAL_TABLET | Freq: Two times a day (BID) | ORAL | 1 refills | Status: DC

## 2021-10-30 NOTE — Progress Notes (Signed)
[place] NOTE  Patient Care Team: Idelle Crouch, MD as PCP - General (Internal Medicine) Cammie Sickle, MD as Consulting Physician (Oncology)  CHIEF COMPLAINTS/PURPOSE OF CONSULTATION: ANEMIA  HEMATOLOGY HISTORY:  # ANEMIA NORMOCYTIC  [JAN 2023- PCP-hemoglobin 8.7; saturation 7%; ferritin 23-LL-28]EGD/ colonoscopy-JAN 2023 [[facility]]; NO CT-A/P or capsule.   # MARCH 2023-possible MGUS; IgG kappa 1.6 g; kappa/lambda light chain ratio 33  March 2022-PE small volume right lower lobe; and right lower extremity above-knee DVT.  On Eliquis; OFF in March 2023; JUNE 15th-DVT-femoral/popliteal nonocclusive-started on Eliquis  # Hx of PVD [Dr.Dew]   Latest Reference Range & Units Most Recent 06/11/21 12:25 09/19/21 10:53  Kappa free light chain 3.3 - 19.4 mg/L 246.9 (H) 09/19/21 10:53 351.3 (H) 246.9 (H)  Lambda free light chains 5.7 - 26.3 mg/L 8.9 09/19/21 10:53 10.5 8.9  Kappa, lambda light chain ratio 0.26 - 1.65  27.74 (H) 09/19/21 10:53 33.46 (H) 27.74 (H)  (H): Data is abnormally high   HISTORY OF PRESENTING ILLNESS: Patient accompanied by his wife.  Ambulating independently with cane. Slightly hard of hearing.  Jonathon Barrera 81 y.o.  male iron deficient anemia-unclear etiology; history of DVT/PE, MGUS, with recent DVT, now on eliquis, hx of iron infusions, who returns to clinic for follow up. Tolerating eliquis well without bleding or bruising. Has chronically blackened stools with iron but unchanged. Has ongoing right calf pain worse with walking/exertion. This limits his mobility and frustrates him. Otherwise he feels at baseline.   Review of Systems  Constitutional:  Positive for malaise/fatigue. Negative for chills, diaphoresis, fever and weight loss.  HENT:  Negative for nosebleeds and sore throat.   Eyes:  Negative for double vision.  Respiratory:  Negative for cough, hemoptysis, sputum production, shortness of breath and wheezing.    Cardiovascular:  Positive for claudication. Negative for chest pain, palpitations, orthopnea and leg swelling.  Gastrointestinal:  Negative for abdominal pain, blood in stool, constipation, diarrhea, heartburn, melena, nausea and vomiting.  Genitourinary:  Negative for dysuria, frequency and urgency.  Musculoskeletal:  Positive for joint pain. Negative for back pain.  Skin: Negative.  Negative for itching and rash.  Neurological:  Negative for dizziness, tingling, focal weakness, weakness and headaches.  Endo/Heme/Allergies:  Does not bruise/bleed easily.  Psychiatric/Behavioral:  Negative for depression. The patient is not nervous/anxious and does not have insomnia.     MEDICAL HISTORY:  Past Medical History:  Diagnosis Date   Actinic keratosis    Acute deep vein thrombosis (DVT) of distal vein of right lower extremity ([place]) 06/19/2020   Aortic atherosclerosis (HCC)    Bladder carcinoma ([place]) 05/26/2007   a.) papillary-transitional cell   BPH (benign prostatic hyperplasia)    Colon polyps    COPD (chronic obstructive pulmonary disease) (HCC)    Current use of long term anticoagulation    a.) Apixaban   Diverticulosis    DOE (dyspnea on exertion)    HOH (hard of hearing)    a.) uses BILATERAL assistive devices   Hyperlipemia    Hypertension    Mass    Lt lower cheek   Melanoma ([place]) 2000   Tx by Dr Koleen Nimrod   PAD (peripheral artery disease) [facility])    Peripheral vascular disease ([place])    Pulmonary embolism ([place]) 06/19/2020   a.) RLL; no associated RIGHT heart strain   RBBB (right bundle branch block)    Squamous cell carcinoma of skin 02/01/2017   Left dorsum latera. hand. [place]  Squamous cell carcinoma of skin 02/01/2017   Left dorsum base of thumb. [facility]   T2DM (type 2 diabetes mellitus) ([place])     SURGICAL HISTORY: Past Surgical History:  Procedure Laterality Date   back skin cancer     BLADDER SURGERY     CARDIAC CATHETERIZATION     CATARACT EXTRACTION W/PHACO  Right 04/30/2015   Procedure: CATARACT EXTRACTION PHACO AND INTRAOCULAR LENS PLACEMENT (IOC);  Surgeon: Birder Robson, MD;  Location: [facility] ORS;  Service: Ophthalmology;  Laterality: Right;  [place] 01:03 AP% 21.4 CDE  13.57 fluid pack lot # [id number]   CATARACT EXTRACTION W/PHACO Left 05/21/2015   Procedure: CATARACT EXTRACTION PHACO AND INTRAOCULAR LENS PLACEMENT (IOC);  Surgeon: Birder Robson, MD;  Location: [facility] ORS;  Service: Ophthalmology;  Laterality: Left;  [place] 00:52 AP% 18.9 CDE 9.95 fluid pack lot #[id number]   COLONOSCOPY WITH PROPOFOL N/A 11/05/2014   Procedure: COLONOSCOPY WITH PROPOFOL;  Surgeon: Hulen Luster, MD;  Location: [facility]OSCOPY;  Service: Gastroenterology;  Laterality: N/A;   COLONOSCOPY WITH PROPOFOL N/A 05/19/2021   Procedure: COLONOSCOPY WITH PROPOFOL;  Surgeon: Annamaria Helling, DO;  Location: [facility] ENDOSCOPY;  Service: Gastroenterology;  Laterality: N/A;  DM Patient is hard of hearing and would like for wife to answer questions, please.   ENDARTERECTOMY FEMORAL Right 02/26/2021   Procedure: ENDARTERECTOMY FEMORAL (SFA STENT PLACEMEN);  Surgeon: Algernon Huxley, MD;  Location: [facility] ORS;  Service: Vascular;  Laterality: Right;   ESOPHAGOGASTRODUODENOSCOPY (EGD) WITH PROPOFOL N/A 05/19/2021   Procedure: ESOPHAGOGASTRODUODENOSCOPY (EGD) WITH PROPOFOL;  Surgeon: Annamaria Helling, DO;  Location: [place];  Service: Gastroenterology;  Laterality: N/A;   HERNIA REPAIR     inguinal   KYPHOPLASTY N/A 02/23/2019   Procedure: T12 KYPHOPLASTY;  Surgeon: Hessie Knows, MD;  Location: [facility] ORS;  Service: Orthopedics;  Laterality: N/A;   LESION EXCISION N/A 02/23/2018   Procedure: EXCISION SCALP CYST AND FACIAL CYST;  Surgeon: Herbert Pun, MD;  Location: [facility] ORS;  Service: General;  Laterality: N/A;   LOWER EXTREMITY ANGIOGRAPHY Right 01/23/2021   Procedure: LOWER EXTREMITY ANGIOGRAPHY;  Surgeon: Algernon Huxley, MD;  Location: [place] CV LAB;  Service:  Cardiovascular;  Laterality: Right;   SHOULDER ARTHROSCOPY W/ ROTATOR CUFF REPAIR Right    TUR-BT      SOCIAL HISTORY: Social History   Socioeconomic History   Marital status: Married    Spouse name: Not on file   Number of children: Not on file   Years of education: Not on file   Highest education level: Not on file  Occupational History   Not on file  Tobacco Use   Smoking status: Every Day    Packs/day: 0.50    Types: Cigarettes   Smokeless tobacco: Never  Vaping Use   Vaping Use: Never used  Substance and Sexual Activity   Alcohol use: Yes    Alcohol/week: 6.0 standard drinks of alcohol    Types: 6 Cans of beer per week   Drug use: No   Sexual activity: Not on file  Other Topics Concern   Not on file  Social History Narrative   Lives in [place]; with wife. 2 children/near by. Smokes 1/2 ppd; 3-4 beers/weekend. Retired in 2010; Systems analyst.    Social Determinants of Health   Financial Resource Strain: Not on file  Food Insecurity: Not on file  Transportation Needs: Not on file  Physical Activity: Not on file  Stress: Not on file  Social Connections: Not on  file  Intimate Partner Violence: Not on file    FAMILY HISTORY: Family History  Problem Relation Age of Onset   Diabetes Mother    Colon cancer Mother    Diabetes Father     ALLERGIES:  has No Known Allergies.  MEDICATIONS:  Current Outpatient Medications  Medication Sig Dispense Refill   APIXABAN (ELIQUIS) VTE STARTER PACK (10MG AND 5MG) Take as directed on package: start with two-80m tablets twice daily for 7 days. On day 8, switch to one-520mtablet twice daily. 1 each 0   Artificial Tear Solution (SOOTHE XP) SOLN Place 1 drop into both eyes 3 (three) times daily as needed (dry eyes).     ferrous sulfate 325 (65 FE) MG tablet Take 325 mg by mouth 2 (two) times daily with a meal.     finasteride (PROSCAR) 5 MG tablet Take 5 mg by mouth daily.     glimepiride (AMARYL) 4 MG  tablet Take 4 mg by mouth in the morning and at bedtime.     hydrochlorothiazide (HYDRODIURIL) 25 MG tablet Take 25 mg by mouth daily.     JANUVIA 100 MG tablet Take 100 mg by mouth daily.     Misc Natural Products (URINOZINC PLUS PO) Take 1 tablet by mouth 2 (two) times daily.      telmisartan (MICARDIS) 80 MG tablet Take 80 mg by mouth daily.     aspirin 325 MG tablet Take by mouth. (Patient not taking: Reported on 10/30/2021)     pantoprazole (PROTONIX) 40 MG tablet Take 1 tablet by mouth 2 (two) times daily. (Patient not taking: Reported on 10/30/2021)     No current facility-administered medications for this visit.    PHYSICAL EXAMINATION: Vitals:   10/30/21 1026  BP: 126/78  Pulse: 79  Resp: 16  Temp: 98.1 F (36.7 C)  SpO2: 99%   Filed Weights   10/30/21 1026  Weight: 182 lb 8 oz (82.8 kg)    Physical Exam Vitals and nursing note reviewed.  HENT:     Head: Normocephalic and atraumatic.     Mouth/Throat:     Pharynx: Oropharynx is clear.  Eyes:     Extraocular Movements: Extraocular movements intact.     Pupils: Pupils are equal, round, and reactive to light.  Cardiovascular:     Rate and Rhythm: Normal rate and regular rhythm.  Pulmonary:     Comments: Decreased breath sounds bilaterally.  Abdominal:     Palpations: Abdomen is soft.  Musculoskeletal:        General: Normal range of motion.     Cervical back: Normal range of motion.  Skin:    General: Skin is warm.  Neurological:     General: No focal deficit present.     Mental Status: He is alert and oriented to person, place, and time.  Psychiatric:        Behavior: Behavior normal.        Judgment: Judgment normal.    LABORATORY DATA:  I have reviewed the data as listed Lab Results  Component Value Date   WBC 4.1 09/19/2021   HGB 12.8 (L) 09/19/2021   HCT 38.2 (L) 09/19/2021   MCV 95.5 09/19/2021   PLT 124 (L) 09/19/2021   Recent Labs    02/27/21 0041 06/11/21 1225 09/19/21 1053  NA 135 135  139  K 3.8 3.7 4.0  CL 107 99 104  CO2 23 28 29   GLUCOSE 215* 150* 127*  BUN 19 21 23  CREATININE 1.01 0.97 1.06  CALCIUM 8.0* 9.5 9.0  GFRNONAA >60 >60 >60  PROT  --  8.4*  --   ALBUMIN  --  4.2  --   AST  --  27  --   ALT  --  30  --   ALKPHOS  --  79  --   BILITOT  --  0.4  --     US Venous Img Lower Unilateral Right (DVT)  Result Date: 10/02/2021 CLINICAL DATA:  RIGHT LE Swelling/pain; prior Hx of DVT. EXAM: RIGHT LOWER EXTREMITY VENOUS DOPPLER ULTRASOUND TECHNIQUE: Gray-scale sonography with graded compression, as well as color Doppler and duplex ultrasound were performed to evaluate the lower extremity deep venous systems from the level of the common femoral vein and including the common femoral, femoral, profunda femoral, popliteal and calf veins including the posterior tibial, peroneal and gastrocnemius veins when visible. The superficial great saphenous vein was also interrogated. Spectral Doppler was utilized to evaluate flow at rest and with distal augmentation maneuvers in the common femoral, femoral and popliteal veins. COMPARISON:  None Available. FINDINGS: Contralateral Common Femoral Vein: Respiratory phasicity is normal and symmetric with the symptomatic side. No evidence of thrombus. Normal compressibility. Common Femoral Vein: No evidence of thrombus. Normal compressibility, respiratory phasicity and response to augmentation. Saphenofemoral Junction: No evidence of thrombus. Normal compressibility and flow on color Doppler imaging. Profunda Femoral Vein: No evidence of thrombus. Normal compressibility and flow on color Doppler imaging. Femoral Vein: Nonocclusive thrombus with noncompressibility. Popliteal Vein: Nonocclusive thrombus with noncompressibility. Calf Veins: No evidence of thrombus. Normal compressibility and flow on color Doppler imaging. IMPRESSION: Nonocclusive thrombus within the right femoral vein and popliteal vein. These results will be called to the ordering  clinician or representative by the Radiologist Assistant, and communication documented in the PACS or [employer]. Electronically Signed   By: Margaretha Sheffield M.D.   On: 10/02/2021 14:00     Assessment & Plan:  No problem-specific Assessment & Plan notes found for this encounter.  # Right LE DVT- March 2022 small volume PE/Right LE DVT of right lower extremity (above knee-femoral). August 2022 repeat ultrasound was showed improved clot burden but not resolved. Off Eliquis since March. Symptomatic in June 2023, Ultrasound showed DVT of femoral/popliteal. Now s/p eliquis starter dosing. Tolerating well. Reviewed that given history of prior dvt and pe, he will require life long anticoagulation. Reviewed risks vs benefit. He has discontinued aspirin. He will continue eliquis 5 mg twice a day. Encouraged him to not miss doses. Reviewed post-thrombotic symptoms are likely long term and may be life long vs slowly improve. Can trial compression therapy but unclear how much improvement he will gain. Consider elevation and rest when symptomatic. Could consider re-ultrasound if symptoms worsening to evaluate for eliquis failure but reviewed this is low likelihood and I suspect symptoms are related to previous clot.   # Moderate to severe anemia/likely iron deficiency- s/p IV Venofer, now on iron twice a day. Tolerating well. No labs today but patient reports no worsening symptoms. Black stools at baseline with iron. Etiology of IDA is unclear. Would consider capsule study if ongoing anemia.   # MGUS:  ELEVATED M protein:MARCH 2023- IgGK- 1.6 /dl; K/L= 33 [anemia-see above; no hypercalcemia no renal insufficiency; ?  Bone lesions-no x-rays done]. JUNE 2023- M 1.4; K/L ratio: 29 ; overall stable.  HOLD bone marrow biopsy.  Patient in agreement.    #Intermittent thrombocytopenia isolated 124 mild monitor for now-while on Eliquis.   #  Hx of PVD [s/p stenting]- on asprin Dr.Dew. Off aspirin.    DISPOSITION:   Follow up with Dr. Rogue Bussing as planned. If worsening symptoms in interim, return to clinic for re-evaluation.    All questions were answered. The patient knows to call the clinic with any problems, questions or concerns.   Verlon Au, NP 10/30/2021   CC: Dr. Rogue Bussing

## 2021-10-30 NOTE — Progress Notes (Signed)
Returns for follow-up of DVT. He continues Eliquis as prescribed. Reports continued calf pain with activity. He would like to know if increasing eliquis back to starting dose would be beneficial.

## 2021-11-13 DIAGNOSIS — G245 Blepharospasm: Secondary | ICD-10-CM | POA: Diagnosis not present

## 2021-11-13 DIAGNOSIS — H3554 Dystrophies primarily involving the retinal pigment epithelium: Secondary | ICD-10-CM | POA: Diagnosis not present

## 2021-11-21 DIAGNOSIS — M5136 Other intervertebral disc degeneration, lumbar region: Secondary | ICD-10-CM | POA: Diagnosis not present

## 2021-11-21 DIAGNOSIS — M5441 Lumbago with sciatica, right side: Secondary | ICD-10-CM | POA: Diagnosis not present

## 2021-11-21 DIAGNOSIS — G8929 Other chronic pain: Secondary | ICD-10-CM | POA: Diagnosis not present

## 2021-11-21 DIAGNOSIS — I739 Peripheral vascular disease, unspecified: Secondary | ICD-10-CM | POA: Diagnosis not present

## 2021-11-26 ENCOUNTER — Other Ambulatory Visit: Payer: Self-pay | Admitting: Family Medicine

## 2021-11-26 DIAGNOSIS — I739 Peripheral vascular disease, unspecified: Secondary | ICD-10-CM

## 2021-11-27 ENCOUNTER — Ambulatory Visit: Payer: PPO | Admitting: Dermatology

## 2021-11-27 DIAGNOSIS — L570 Actinic keratosis: Secondary | ICD-10-CM | POA: Diagnosis not present

## 2021-11-27 DIAGNOSIS — D229 Melanocytic nevi, unspecified: Secondary | ICD-10-CM

## 2021-11-27 DIAGNOSIS — L578 Other skin changes due to chronic exposure to nonionizing radiation: Secondary | ICD-10-CM

## 2021-11-27 DIAGNOSIS — L814 Other melanin hyperpigmentation: Secondary | ICD-10-CM | POA: Diagnosis not present

## 2021-11-27 DIAGNOSIS — Z85828 Personal history of other malignant neoplasm of skin: Secondary | ICD-10-CM | POA: Diagnosis not present

## 2021-11-27 DIAGNOSIS — L821 Other seborrheic keratosis: Secondary | ICD-10-CM

## 2021-11-27 DIAGNOSIS — D1800 Hemangioma unspecified site: Secondary | ICD-10-CM | POA: Diagnosis not present

## 2021-11-27 DIAGNOSIS — Z1283 Encounter for screening for malignant neoplasm of skin: Secondary | ICD-10-CM

## 2021-11-27 DIAGNOSIS — Z85820 Personal history of malignant melanoma of skin: Secondary | ICD-10-CM

## 2021-11-27 DIAGNOSIS — L820 Inflamed seborrheic keratosis: Secondary | ICD-10-CM

## 2021-11-27 NOTE — Progress Notes (Signed)
Follow-Up Visit   Subjective  Jonathon Barrera is a 81 y.o. male who presents for the following: Annual Exam (Mole check ). Hx of Melanoma on the back 13 years ago (~2000) The patient presents for Total-Body Skin Exam (TBSE) for skin cancer screening and mole check.  The patient has spots, moles and lesions to be evaluated, some may be new or changing and the patient has concerns that these could be cancer.   Wife with patient   The following portions of the chart were reviewed this encounter and updated as appropriate:   Tobacco  Allergies  Meds  Problems  Med Hx  Surg Hx  Fam Hx     Review of Systems:  No other skin or systemic complaints except as noted in HPI or Assessment and Plan.  Objective  Well appearing patient in no apparent distress; mood and affect are within normal limits.  A full examination was performed including scalp, head, eyes, ears, nose, lips, neck, chest, axillae, abdomen, back, buttocks, bilateral upper extremities, bilateral lower extremities, hands, feet, fingers, toes, fingernails, and toenails. All findings within normal limits unless otherwise noted below.  left infra axillary x 1, right posterior ear x 1, scalp x 1  (3) (3) Stuck-on, waxy, tan-brown papules --Discussed benign etiology and prognosis.   left ear x 2, right hand x 8  (10) (10) Erythematous thin papules/macules with gritty scale.    Assessment & Plan  Inflamed seborrheic keratosis (3) left infra axillary x 1, right posterior ear x 1, scalp x 1  (3)  Symptomatic, irritating, patient would like treated.   Destruction of lesion - left infra axillary x 1, right posterior ear x 1, scalp x 1  (3) Complexity: simple   Destruction method: cryotherapy   Informed consent: discussed and consent obtained   Timeout:  patient name, date of birth, surgical site, and procedure verified Lesion destroyed using liquid nitrogen: Yes   Region frozen until ice ball extended beyond lesion: Yes    Outcome: patient tolerated procedure well with no complications   Post-procedure details: wound care instructions given    AK (actinic keratosis) (10) left ear x 2, right hand x 8  (10)  Actinic keratoses are precancerous spots that appear secondary to cumulative UV radiation exposure/sun exposure over time. They are chronic with expected duration over 1 year. A portion of actinic keratoses will progress to squamous cell carcinoma of the skin. It is not possible to reliably predict which spots will progress to skin cancer and so treatment is recommended to prevent development of skin cancer.  Recommend daily broad spectrum sunscreen SPF 30+ to sun-exposed areas, reapply every 2 hours as needed.  Recommend staying in the shade or wearing long sleeves, sun glasses (UVA+UVB protection) and wide brim hats (4-inch brim around the entire circumference of the hat). Call for new or changing lesions.   Destruction of lesion - left ear x 2, right hand x 8  (10) Complexity: simple   Destruction method: cryotherapy   Informed consent: discussed and consent obtained   Timeout:  patient name, date of birth, surgical site, and procedure verified Lesion destroyed using liquid nitrogen: Yes   Region frozen until ice ball extended beyond lesion: Yes   Outcome: patient tolerated procedure well with no complications   Post-procedure details: wound care instructions given    Lentigines - Scattered tan macules - Due to sun exposure - Benign-appearing, observe - Recommend daily broad spectrum sunscreen SPF 30+ to sun-exposed areas,  reapply every 2 hours as needed. - Call for any changes  Seborrheic Keratoses - Stuck-on, waxy, tan-brown papules and/or plaques  - Benign-appearing - Discussed benign etiology and prognosis. - Observe - Call for any changes  Melanocytic Nevi - Tan-brown and/or pink-flesh-colored symmetric macules and papules - Benign appearing on exam today - Observation - Call clinic  for new or changing moles - Recommend daily use of broad spectrum spf 30+ sunscreen to sun-exposed areas.   Hemangiomas - Red papules - Discussed benign nature - Observe - Call for any changes  Actinic Damage-SEVERE - Chronic condition, secondary to cumulative UV/sun exposure - diffuse scaly erythematous macules with underlying dyspigmentation - Recommend daily broad spectrum sunscreen SPF 30+ to sun-exposed areas, reapply every 2 hours as needed.  - Staying in the shade or wearing long sleeves, sun glasses (UVA+UVB protection) and wide brim hats (4-inch brim around the entire circumference of the hat) are also recommended for sun protection.  - Call for new or changing lesions.  History of Squamous Cell Carcinoma of the Skin Multiple see history - No evidence of recurrence today - No lymphadenopathy - Recommend regular full body skin exams - Recommend daily broad spectrum sunscreen SPF 30+ to sun-exposed areas, reapply every 2 hours as needed.  - Call if any new or changing lesions are noted between office visits   History of Melanoma Back  - No evidence of recurrence today - No lymphadenopathy - Recommend regular full body skin exams - Recommend daily broad spectrum sunscreen SPF 30+ to sun-exposed areas, reapply every 2 hours as needed.  - Call if any new or changing lesions are noted between office visits   Skin cancer screening performed today.   Return in about 1 year (around 11/28/2022) for TBSE hx of Melanoma .  IMarye Round, CMA, am acting as scribe for Sarina Ser, MD .  Documentation: I have reviewed the above documentation for accuracy and completeness, and I agree with the above.  Sarina Ser, MD

## 2021-11-27 NOTE — Patient Instructions (Addendum)
Cryotherapy Aftercare  Wash gently with soap and water everyday.   Apply Vaseline and Band-Aid daily until healed.     Due to recent changes in healthcare laws, you may see results of your pathology and/or laboratory studies on MyChart before the doctors have had a chance to review them. We understand that in some cases there may be results that are confusing or concerning to you. Please understand that not all results are received at the same time and often the doctors may need to interpret multiple results in order to provide you with the best plan of care or course of treatment. Therefore, we ask that you please give us 2 business days to thoroughly review all your results before contacting the office for clarification. Should we see a critical lab result, you will be contacted sooner.   If You Need Anything After Your Visit  If you have any questions or concerns for your doctor, please call our main line at [phone] and press option 4 to reach your doctor's medical assistant. If no one answers, please leave a voicemail as directed and we will return your call as soon as possible. Messages left after 4 pm will be answered the following business day.   You may also send us a message via MyChart. We typically respond to MyChart messages within 1-2 business days.  For prescription refills, please ask your pharmacy to contact our office. Our fax number is [phone].  If you have an urgent issue when the clinic is closed that cannot wait until the next business day, you can page your doctor at the number below.    Please note that while we do our best to be available for urgent issues outside of office hours, we are not available 24/7.   If you have an urgent issue and are unable to reach us, you may choose to seek medical care at your doctor's office, retail clinic, urgent care center, or emergency room.  If you have a medical emergency, please immediately call 911 or go to the  emergency department.  Pager Numbers  - Dr. Kowalski: [phone]  - Dr. Moye: [phone]  - Dr. Stewart: [phone]  In the event of inclement weather, please call our main line at [phone] for an update on the status of any delays or closures.  Dermatology Medication Tips: Please keep the boxes that topical medications come in in order to help keep track of the instructions about where and how to use these. Pharmacies typically print the medication instructions only on the boxes and not directly on the medication tubes.   If your medication is too expensive, please contact our office at [phone] option 4 or send us a message through MyChart.   We are unable to tell what your co-pay for medications will be in advance as this is different depending on your insurance coverage. However, we may be able to find a substitute medication at lower cost or fill out paperwork to get insurance to cover a needed medication.   If a prior authorization is required to get your medication covered by your insurance company, please allow us 1-2 business days to complete this process.  Drug prices often vary depending on where the prescription is filled and some pharmacies may offer cheaper prices.  The website www.goodrx.com contains coupons for medications through different pharmacies. The prices here do not account for what the cost may be with help from insurance (it may be cheaper with your insurance), but the website can   give you the price if you did not use any insurance.  - You can print the associated coupon and take it with your prescription to the pharmacy.  - You may also stop by our office during regular business hours and pick up a GoodRx coupon card.  - If you need your prescription sent electronically to a different pharmacy, notify our office through [facility] MyChart or by phone at [phone] option 4.     Si Usted Necesita Algo Despus de Su Visita  Tambin puede  enviarnos un mensaje a travs de MyChart. Por lo general respondemos a los mensajes de MyChart en el transcurso de 1 a 2 das hbiles.  Para renovar recetas, por favor pida a su farmacia que se ponga en contacto con nuestra oficina. Nuestro nmero de fax es el [phone].  Si tiene un asunto urgente cuando la clnica est cerrada y que no puede esperar hasta el siguiente da hbil, puede llamar/localizar a su doctor(a) al nmero que aparece a continuacin.   Por favor, tenga en cuenta que aunque hacemos todo lo posible para estar disponibles para asuntos urgentes fuera del horario de oficina, no estamos disponibles las 24 horas del da, los 7 das de la semana.   Si tiene un problema urgente y no puede comunicarse con nosotros, puede optar por buscar atencin mdica  en el consultorio de su doctor(a), en una clnica privada, en un centro de atencin urgente o en una sala de emergencias.  Si tiene una emergencia mdica, por favor llame inmediatamente al 911 o vaya a la sala de emergencias.  Nmeros de bper  - Dr. Kowalski: [phone]  - Dra. Moye: [phone]  - Dra. Stewart: [phone]  En caso de inclemencias del tiempo, por favor llame a nuestra lnea principal al [phone] para una actualizacin sobre el estado de cualquier retraso o cierre.  Consejos para la medicacin en dermatologa: Por favor, guarde las cajas en las que vienen los medicamentos de uso tpico para ayudarle a seguir las instrucciones sobre dnde y cmo usarlos. Las farmacias generalmente imprimen las instrucciones del medicamento slo en las cajas y no directamente en los tubos del medicamento.   Si su medicamento es muy caro, por favor, pngase en contacto con nuestra oficina llamando al [phone] y presione la opcin 4 o envenos un mensaje a travs de MyChart.   No podemos decirle cul ser su copago por los medicamentos por adelantado ya que esto es diferente dependiendo de la cobertura de su seguro.  Sin embargo, es posible que podamos encontrar un medicamento sustituto a menor costo o llenar un formulario para que el seguro cubra el medicamento que se considera necesario.   Si se requiere una autorizacin previa para que su compaa de seguros cubra su medicamento, por favor permtanos de 1 a 2 das hbiles para completar este proceso.  Los precios de los medicamentos varan con frecuencia dependiendo del lugar de dnde se surte la receta y alguna farmacias pueden ofrecer precios ms baratos.  El sitio web www.goodrx.com tiene cupones para medicamentos de diferentes farmacias. Los precios aqu no tienen en cuenta lo que podra costar con la ayuda del seguro (puede ser ms barato con su seguro), pero el sitio web puede darle el precio si no utiliz ningn seguro.  - Puede imprimir el cupn correspondiente y llevarlo con su receta a la farmacia.  - Tambin puede pasar por nuestra oficina durante el horario de atencin regular y recoger una tarjeta de cupones de GoodRx.  -   Si necesita que su receta se enve electrnicamente a una farmacia diferente, informe a nuestra oficina a travs de MyChart de [facility] o por telfono llamando al [phone] y presione la opcin 4.  

## 2021-12-02 ENCOUNTER — Encounter: Payer: Self-pay | Admitting: Dermatology

## 2021-12-04 ENCOUNTER — Ambulatory Visit
Admission: RE | Admit: 2021-12-04 | Discharge: 2021-12-04 | Disposition: A | Discharge status: Home / Self Care | Payer: PPO | Source: Ambulatory Visit | Attending: Family Medicine | Admitting: Family Medicine

## 2021-12-04 DIAGNOSIS — M47816 Spondylosis without myelopathy or radiculopathy, lumbar region: Secondary | ICD-10-CM | POA: Diagnosis not present

## 2021-12-04 DIAGNOSIS — M48061 Spinal stenosis, lumbar region without neurogenic claudication: Secondary | ICD-10-CM | POA: Diagnosis not present

## 2021-12-04 DIAGNOSIS — I739 Peripheral vascular disease, unspecified: Secondary | ICD-10-CM

## 2021-12-04 DIAGNOSIS — M545 Low back pain, unspecified: Secondary | ICD-10-CM | POA: Diagnosis not present

## 2021-12-09 ENCOUNTER — Other Ambulatory Visit: Payer: PPO

## 2021-12-10 ENCOUNTER — Other Ambulatory Visit: Payer: PPO

## 2021-12-10 ENCOUNTER — Telehealth (INDEPENDENT_AMBULATORY_CARE_PROVIDER_SITE_OTHER): Payer: Self-pay | Admitting: Nurse Practitioner

## 2021-12-10 NOTE — Telephone Encounter (Signed)
Patients wife called back stating that patient went to PCP and had MRI done and it has nothing to do with the pain he is having in his right leg. Patient had stints put in November and the leg is bothering him really bad right now. The pain has been going on since June, patient thought it was a blot clot but PCP states he doesn't think its that. Recommends for them to see [place] sooner than appt. Patient is having a lot of pain when walking.     Please call and advise

## 2021-12-10 NOTE — Telephone Encounter (Signed)
He can come in for ABIs to see me or dew

## 2021-12-10 NOTE — Telephone Encounter (Signed)
Patient's wife called and left a message stating that patient has an appointment in September on the 21st but states he needs to be seen sooner.  Please advise.

## 2022-01-01 ENCOUNTER — Encounter: Payer: Self-pay | Admitting: Internal Medicine

## 2022-01-07 ENCOUNTER — Other Ambulatory Visit (INDEPENDENT_AMBULATORY_CARE_PROVIDER_SITE_OTHER): Payer: Self-pay | Admitting: Nurse Practitioner

## 2022-01-07 DIAGNOSIS — I739 Peripheral vascular disease, unspecified: Secondary | ICD-10-CM

## 2022-01-07 DIAGNOSIS — M79604 Pain in right leg: Secondary | ICD-10-CM

## 2022-01-08 ENCOUNTER — Ambulatory Visit (INDEPENDENT_AMBULATORY_CARE_PROVIDER_SITE_OTHER): Payer: PPO

## 2022-01-08 ENCOUNTER — Other Ambulatory Visit (INDEPENDENT_AMBULATORY_CARE_PROVIDER_SITE_OTHER): Payer: Self-pay | Admitting: Vascular Surgery

## 2022-01-08 ENCOUNTER — Telehealth (INDEPENDENT_AMBULATORY_CARE_PROVIDER_SITE_OTHER): Payer: Self-pay

## 2022-01-08 ENCOUNTER — Encounter (INDEPENDENT_AMBULATORY_CARE_PROVIDER_SITE_OTHER): Payer: Self-pay | Admitting: Nurse Practitioner

## 2022-01-08 ENCOUNTER — Ambulatory Visit (INDEPENDENT_AMBULATORY_CARE_PROVIDER_SITE_OTHER): Payer: PPO | Admitting: Nurse Practitioner

## 2022-01-08 VITALS — BP 170/90 | HR 61 | Resp 18 | Ht 69.0 in | Wt 185.0 lb

## 2022-01-08 DIAGNOSIS — E785 Hyperlipidemia, unspecified: Secondary | ICD-10-CM | POA: Diagnosis not present

## 2022-01-08 DIAGNOSIS — Z9889 Other specified postprocedural states: Secondary | ICD-10-CM | POA: Diagnosis not present

## 2022-01-08 DIAGNOSIS — I739 Peripheral vascular disease, unspecified: Secondary | ICD-10-CM | POA: Diagnosis not present

## 2022-01-08 DIAGNOSIS — Z720 Tobacco use: Secondary | ICD-10-CM | POA: Diagnosis not present

## 2022-01-08 DIAGNOSIS — I10 Essential (primary) hypertension: Secondary | ICD-10-CM | POA: Diagnosis not present

## 2022-01-08 NOTE — Telephone Encounter (Signed)
Spoke with the patient and he is scheduled with Dr. Lucky Cowboy for a RLE angio on 01/12/22 with a 11:30 am arrival time to the MM. Pre-procedure instructions were discussed and patient stated he understood.

## 2022-01-08 NOTE — Progress Notes (Signed)
Subjective:    Patient ID: Jonathon Barrera, male    DOB: [birth year], 81 y.o.   MRN: [id number] No chief complaint on file.   The patient returns to the office for followup and review of the noninvasive studies.  The patient is following up sooner than previously scheduled.  He notes that he has been having some severe pain in his right leg.  He notes that it is worse with activity.  He is unable to do normal things such as mowing his lawn or walk up stairs and inclines.  The patient notes that there has been a significant deterioration in the lower extremity symptoms.  The patient notes interval shortening of their claudication distance and development of mild rest pain symptoms. No new ulcers or wounds have occurred since the last visit.  There have been no significant changes to the patient's overall health care.  The patient denies amaurosis fugax or recent TIA symptoms. There are no recent neurological changes noted. There is no history of DVT, PE or superficial thrombophlebitis. The patient denies recent episodes of angina or shortness of breath.   ABI's Rt= 0.56 and Lt= 1.77 (previous ABI's Rt= 1.15 and Lt= 0.79) Duplex US of the lower extremity arterial system shows occlusion of the proximal SFA as well as proximal popliteal artery.  Previously placed SFA stent is occluded with retrograde flow to the distal area.    Review of Systems  Cardiovascular:        Claudication  All other systems reviewed and are negative.      Objective:   Physical Exam Vitals reviewed.  HENT:     Head: Normocephalic.  Cardiovascular:     Rate and Rhythm: Normal rate.     Pulses:          Dorsalis pedis pulses are detected w/ Doppler on the left side.       Posterior tibial pulses are detected w/ Doppler on the left side.  Pulmonary:     Effort: Pulmonary effort is normal.  Skin:    General: Skin is warm and dry.  Neurological:     Mental Status: He is alert and oriented to person, place,  and time.  Psychiatric:        Mood and Affect: Mood normal.        Behavior: Behavior normal.        Thought Content: Thought content normal.        Judgment: Judgment normal.     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   BPH (benign prostatic hyperplasia)    Colon polyps    COPD (chronic obstructive pulmonary disease) (HCC)    Current use of long term anticoagulation    a.) Apixaban   Diverticulosis    DOE (dyspnea on exertion)    HOH (hard of hearing)    a.) uses BILATERAL assistive devices   Hyperlipemia    Hypertension    Mass    Lt lower cheek   Melanoma ([facility]) 2000   Tx by Dr Koleen Nimrod   PAD (peripheral artery  disease) ([place])    Peripheral vascular disease ([place])    Pulmonary embolism ([place]) 06/19/2020   a.) RLL; no associated RIGHT heart strain   RBBB (right bundle branch block)    Squamous cell carcinoma of skin 02/01/2017   Left dorsum latera. hand. EDC   Squamous cell carcinoma of skin 02/01/2017   Left dorsum base of thumb. EDC   T2DM (type 2 diabetes mellitus) ([place])     Social History   Socioeconomic History   Marital status: Married    Spouse name: Not on file   Number of children: Not on file   Years of education: Not on file   Highest education level: Not on file  Occupational History   Not on file  Tobacco Use   Smoking status: Every Day    Packs/day: 0.50    Types: Cigarettes   Smokeless tobacco: Never  Vaping Use   Vaping Use: Never used  Substance and Sexual Activity   Alcohol use: Yes    Alcohol/week: 6.0 standard drinks of alcohol    Types: 6 Cans of beer per week   Drug use: No   Sexual activity:  Not on file  Other Topics Concern   Not on file  Social History Narrative   Lives in [place]; with wife. 2 children/near by. Smokes 1/2 ppd; 3-4 beers/weekend. Retired in 2010; Systems analyst.    Social Determinants of Health   Financial Resource Strain: Not on file  Food Insecurity: Not on file  Transportation Needs: Not on file  Physical Activity: Not on file  Stress: Not on file  Social Connections: Not on file  Intimate Partner Violence: Not on file    Past Surgical History:  Procedure Laterality Date   back skin cancer     BLADDER SURGERY     CARDIAC CATHETERIZATION     CATARACT EXTRACTION W/PHACO Right 04/30/2015   Procedure: CATARACT EXTRACTION PHACO AND INTRAOCULAR LENS PLACEMENT (IOC);  Surgeon: Birder Robson, MD;  Location: [facility] ORS;  Service: Ophthalmology;  Laterality: Right;  [place] 01:03 AP% 21.4 CDE  13.57 fluid pack lot # [id number]   CATARACT EXTRACTION W/PHACO Left 05/21/2015   Procedure: CATARACT EXTRACTION PHACO AND INTRAOCULAR LENS PLACEMENT (IOC);  Surgeon: Birder Robson, MD;  Location: [facility] ORS;  Service: Ophthalmology;  Laterality: Left;  [place] 00:52 AP% 18.9 CDE 9.95 fluid pack lot #[id number]   COLONOSCOPY WITH PROPOFOL N/A 11/05/2014   Procedure: COLONOSCOPY WITH PROPOFOL;  Surgeon: Hulen Luster, MD;  Location[facility];  Service: Gastroenterology;  Laterality: N/A;   COLONOSCOPY WITH PROPOFOL N/A 05/19/2021   Procedure: COLONOSCOPY WITH PROPOFOL;  Surgeon: Annamaria Helling, DO;  Location: [facility] ENDOSCOPY;  Service: Gastroenterology;  Laterality: N/A;  DM Patient is hard of hearing and would like for wife to answer questions, please.   ENDARTERECTOMY FEMORAL Right 02/26/2021   Procedure: ENDARTERECTOMY FEMORAL (SFA STENT PLACEMEN);  Surgeon: Algernon Huxley, MD;  Location: [facility] ORS;  Service: Vascular;  Laterality: Right;   ESOPHAGOGASTRODUODENOSCOPY (EGD) WITH PROPOFOL N/A 05/19/2021   Procedure: ESOPHAGOGASTRODUODENOSCOPY (EGD) WITH  PROPOFOL;  Surgeon: Annamaria Helling, DO;  Location: [facility];  Service: Gastroenterology;  Laterality: N/A;   HERNIA REPAIR     inguinal   KYPHOPLASTY N/A 02/23/2019   Procedure: T12 KYPHOPLASTY;  Surgeon: Hessie Knows, MD;  Location: [facility] ORS;  Service: Orthopedics;  Laterality: N/A;   LESION EXCISION N/A 02/23/2018   Procedure: EXCISION SCALP CYST AND FACIAL CYST;  Surgeon: Herbert Pun, MD;  Location:  [facility] ORS;  Service: General;  Laterality: N/A;   LOWER EXTREMITY ANGIOGRAPHY Right 01/23/2021   Procedure: LOWER EXTREMITY ANGIOGRAPHY;  Surgeon: Algernon Huxley, MD;  Location: [facility] LAB;  Service: Cardiovascular;  Laterality: Right;   SHOULDER ARTHROSCOPY W/ ROTATOR CUFF REPAIR Right    TUR-BT      Family History  Problem Relation Age of Onset   Diabetes Mother    Colon cancer Mother    Diabetes Father     No Known Allergies     Latest Ref Rng & Units 09/19/2021   10:53 AM 07/04/2021   12:58 PM 06/11/2021   12:25 PM  CBC  WBC 4.0 - 10.5 K/uL 4.1  4.1  4.6   Hemoglobin 13.0 - 17.0 g/dL 12.8  11.8  10.1   Hematocrit 39.0 - 52.0 % 38.2  36.1  32.9   Platelets 150 - 400 K/uL 124  182  203       CMP     Component Value Date/Time   NA 139 09/19/2021 1053   K 4.0 09/19/2021 1053   K 3.9 02/23/2012 0928   CL 104 09/19/2021 1053   CO2 29 09/19/2021 1053   GLUCOSE 127 (H) 09/19/2021 1053   BUN 23 09/19/2021 1053   CREATININE 1.06 09/19/2021 1053   CALCIUM 9.0 09/19/2021 1053   PROT 8.4 (H) 06/11/2021 1225   ALBUMIN 4.2 06/11/2021 1225   AST 27 06/11/2021 1225   ALT 30 06/11/2021 1225   ALKPHOS 79 06/11/2021 1225   BILITOT 0.4 06/11/2021 1225   GFRNONAA >60 09/19/2021 1053     No results found.     Assessment & Plan:   1. Peripheral arterial disease with history of revascularization (HCC) Recommend:  The patient has evidence of severe atherosclerotic changes of both lower extremities with rest pain that is associated with preulcerative  changes and impending tissue loss of the right foot.  This represents a limb threatening ischemia and places the patient at the risk for right limb loss.  Patient should undergo angiography of the right lower extremity with the hope for intervention for limb salvage.  The risks and benefits as well as the alternative therapies was discussed in detail with the patient.  All questions were answered.  Patient agrees to proceed with right lower extremity angiography.  The patient will follow up with me in the office after the procedure.      - VAS [place] ABI WITH/WO TBI  2. Primary hypertension Continue antihypertensive medications as already ordered, these medications have been reviewed and there are no changes at this time.   3. Hyperlipidemia, unspecified hyperlipidemia type Continue statin as ordered and reviewed, no changes at this time   4. Tobacco abuse Smoking cessation was discussed, 3-10 minutes spent on this topic specifically    Current Outpatient Medications on File Prior to Visit  Medication Sig Dispense Refill   apixaban (ELIQUIS) 5 MG TABS tablet Take 1 tablet (5 mg total) by mouth 2 (two) times daily. 180 tablet 1   Artificial Tear Solution (SOOTHE XP) SOLN Place 1 drop into both eyes 3 (three) times daily as needed (dry eyes).     ferrous sulfate 325 (65 FE) MG tablet Take 325 mg by mouth 2 (two) times daily with a meal.     finasteride (PROSCAR) 5 MG tablet Take 5 mg by mouth daily.     glimepiride (AMARYL) 4 MG tablet Take 4 mg by mouth in the morning and at bedtime.  hydrochlorothiazide (HYDRODIURIL) 25 MG tablet Take 25 mg by mouth daily.     JANUVIA 100 MG tablet Take 100 mg by mouth daily.     Misc Natural Products (URINOZINC PLUS PO) Take 1 tablet by mouth 2 (two) times daily.      telmisartan (MICARDIS) 80 MG tablet Take 80 mg by mouth daily.     No current facility-administered medications on file prior to visit.    There are no Patient Instructions on file for  this visit. No follow-ups on file.   Kris Hartmann, NP

## 2022-01-08 NOTE — H&P (View-Only) (Signed)
Subjective:    Patient ID: Jonathon Barrera, male    DOB: 02-06-1941, 81 y.o.   MRN: [id number] No chief complaint on file.   The patient returns to the office for followup and review of the noninvasive studies.  The patient is following up sooner than previously scheduled.  He notes that he has been having some severe pain in his right leg.  He notes that it is worse with activity.  He is unable to do normal things such as mowing his lawn or walk up stairs and inclines.  The patient notes that there has been a significant deterioration in the lower extremity symptoms.  The patient notes interval shortening of their claudication distance and development of mild rest pain symptoms. No new ulcers or wounds have occurred since the last visit.  There have been no significant changes to the patient's overall health care.  The patient denies amaurosis fugax or recent TIA symptoms. There are no recent neurological changes noted. There is no history of DVT, PE or superficial thrombophlebitis. The patient denies recent episodes of angina or shortness of breath.   ABI's Rt= 0.56 and Lt= 1.77 (previous ABI's Rt= 1.15 and Lt= 0.79) Duplex US of the lower extremity arterial system shows occlusion of the proximal SFA as well as proximal popliteal artery.  Previously placed SFA stent is occluded with retrograde flow to the distal area.    Review of Systems  Cardiovascular:        Claudication  All other systems reviewed and are negative.      Objective:   Physical Exam Vitals reviewed.  HENT:     Head: Normocephalic.  Cardiovascular:     Rate and Rhythm: Normal rate.     Pulses:          Dorsalis pedis pulses are detected w/ Doppler on the left side.       Posterior tibial pulses are detected w/ Doppler on the left side.  Pulmonary:     Effort: Pulmonary effort is normal.  Skin:    General: Skin is warm and dry.  Neurological:     Mental Status: He is alert and oriented to person, place,  and time.  Psychiatric:        Mood and Affect: Mood normal.        Behavior: Behavior normal.        Thought Content: Thought content normal.        Judgment: Judgment normal.     BP (!) 170/90 (BP Location: Left Arm)   Pulse 61   Resp 18   Ht '5\' 9"'$  (1.753 m)   Wt 185 lb (83.9 kg)   BMI 27.32 kg/m   Past Medical History:  Diagnosis Date   Actinic keratosis    Acute deep vein thrombosis (DVT) of distal vein of right lower extremity ([place]) 06/19/2020   Aortic atherosclerosis (HCC)    Bladder carcinoma ([place]) 05/26/2007   a.) papillary-transitional cell   BPH (benign prostatic hyperplasia)    Colon polyps    COPD (chronic obstructive pulmonary disease) (HCC)    Current use of long term anticoagulation    a.) Apixaban   Diverticulosis    DOE (dyspnea on exertion)    HOH (hard of hearing)    a.) uses BILATERAL assistive devices   Hyperlipemia    Hypertension    Mass    Lt lower cheek   Melanoma ([place]) 2000   Tx by Dr Koleen Nimrod   PAD (peripheral artery  disease) ([place])    Peripheral vascular disease ([place])    Pulmonary embolism ([place]) 06/19/2020   a.) RLL; no associated RIGHT heart strain   RBBB (right bundle branch block)    Squamous cell carcinoma of skin 02/01/2017   Left dorsum latera. hand. EDC   Squamous cell carcinoma of skin 02/01/2017   Left dorsum base of thumb. EDC   T2DM (type 2 diabetes mellitus) ([place])     Social History   Socioeconomic History   Marital status: Married    Spouse name: Not on file   Number of children: Not on file   Years of education: Not on file   Highest education level: Not on file  Occupational History   Not on file  Tobacco Use   Smoking status: Every Day    Packs/day: 0.50    Types: Cigarettes   Smokeless tobacco: Never  Vaping Use   Vaping Use: Never used  Substance and Sexual Activity   Alcohol use: Yes    Alcohol/week: 6.0 standard drinks of alcohol    Types: 6 Cans of beer per week   Drug use: No   Sexual activity:  Not on file  Other Topics Concern   Not on file  Social History Narrative   Lives in [place]; with wife. 2 children/near by. Smokes 1/2 ppd; 3-4 beers/weekend. Retired in 2010; Systems analyst.    Social Determinants of Health   Financial Resource Strain: Not on file  Food Insecurity: Not on file  Transportation Needs: Not on file  Physical Activity: Not on file  Stress: Not on file  Social Connections: Not on file  Intimate Partner Violence: Not on file    Past Surgical History:  Procedure Laterality Date   back skin cancer     BLADDER SURGERY     CARDIAC CATHETERIZATION     CATARACT EXTRACTION W/PHACO Right 04/30/2015   Procedure: CATARACT EXTRACTION PHACO AND INTRAOCULAR LENS PLACEMENT (IOC);  Surgeon: Birder Robson, MD;  Location: [facility] ORS;  Service: Ophthalmology;  Laterality: Right;  [place] 01:03 AP% 21.4 CDE  13.57 fluid pack lot # [id number]   CATARACT EXTRACTION W/PHACO Left 05/21/2015   Procedure: CATARACT EXTRACTION PHACO AND INTRAOCULAR LENS PLACEMENT (IOC);  Surgeon: Birder Robson, MD;  Location: [facility];  Service: Ophthalmology;  Laterality: Left;  [place] 00:52 AP% 18.9 CDE 9.95 fluid pack lot #[id number]   COLONOSCOPY WITH PROPOFOL N/A 11/05/2014   Procedure: COLONOSCOPY WITH PROPOFOL;  Surgeon: Hulen Luster, MD;  Location: [facility] ENDOSCOPY;  Service: Gastroenterology;  Laterality: N/A;   COLONOSCOPY WITH PROPOFOL N/A 05/19/2021   Procedure: COLONOSCOPY WITH PROPOFOL;  Surgeon: Annamaria Helling, DO;  [facility];  Service: Gastroenterology;  Laterality: N/A;  DM Patient is hard of hearing and would like for wife to answer questions, please.   ENDARTERECTOMY FEMORAL Right 02/26/2021   Procedure: ENDARTERECTOMY FEMORAL (SFA STENT PLACEMEN);  Surgeon: Algernon Huxley, MD;  Location: [facility] ORS;  Service: Vascular;  Laterality: Right;   ESOPHAGOGASTRODUODENOSCOPY (EGD) WITH PROPOFOL N/A 05/19/2021   Procedure: ESOPHAGOGASTRODUODENOSCOPY (EGD) WITH  PROPOFOL;  Surgeon: Annamaria Helling, DO;  Location: [place] [place];  Service: Gastroenterology;  Laterality: N/A;   HERNIA REPAIR     inguinal   KYPHOPLASTY N/A 02/23/2019   Procedure: T12 KYPHOPLASTY;  Surgeon: Hessie Knows, MD;  Location: [facility] ORS;  Service: Orthopedics;  Laterality: N/A;   LESION EXCISION N/A 02/23/2018   Procedure: EXCISION SCALP CYST AND FACIAL CYST;  Surgeon: Herbert Pun, MD;  Location:  [facility] ORS;  Service: General;  Laterality: N/A;   LOWER EXTREMITY ANGIOGRAPHY Right 01/23/2021   Procedure: LOWER EXTREMITY ANGIOGRAPHY;  Surgeon: Algernon Huxley, MD;  Location: [place] [facility];  Service: Cardiovascular;  Laterality: Right;   SHOULDER ARTHROSCOPY W/ ROTATOR CUFF REPAIR Right    TUR-BT      Family History  Problem Relation Age of Onset   Diabetes Mother    Colon cancer Mother    Diabetes Father     No Known Allergies     Latest Ref Rng & Units 09/19/2021   10:53 AM 07/04/2021   12:58 PM 06/11/2021   12:25 PM  CBC  WBC 4.0 - 10.5 K/uL 4.1  4.1  4.6   Hemoglobin 13.0 - 17.0 g/dL 12.8  11.8  10.1   Hematocrit 39.0 - 52.0 % 38.2  36.1  32.9   Platelets 150 - 400 K/uL 124  182  203       CMP     Component Value Date/Time   NA 139 09/19/2021 1053   K 4.0 09/19/2021 1053   K 3.9 02/23/2012 0928   CL 104 09/19/2021 1053   CO2 29 09/19/2021 1053   GLUCOSE 127 (H) 09/19/2021 1053   BUN 23 09/19/2021 1053   CREATININE 1.06 09/19/2021 1053   CALCIUM 9.0 09/19/2021 1053   PROT 8.4 (H) 06/11/2021 1225   ALBUMIN 4.2 06/11/2021 1225   AST 27 06/11/2021 1225   ALT 30 06/11/2021 1225   ALKPHOS 79 06/11/2021 1225   BILITOT 0.4 06/11/2021 1225   GFRNONAA >60 09/19/2021 1053     No results found.     Assessment & Plan:   1. Peripheral arterial disease with history of revascularization (HCC) Recommend:  The patient has evidence of severe atherosclerotic changes of both lower extremities with rest pain that is associated with preulcerative  changes and impending tissue loss of the right foot.  This represents a limb threatening ischemia and places the patient at the risk for right limb loss.  Patient should undergo angiography of the right lower extremity with the hope for intervention for limb salvage.  The risks and benefits as well as the alternative therapies was discussed in detail with the patient.  All questions were answered.  Patient agrees to proceed with right lower extremity angiography.  The patient will follow up with me in the office after the procedure.      - VAS [place] ABI WITH/WO TBI  2. Primary hypertension Continue antihypertensive medications as already ordered, these medications have been reviewed and there are no changes at this time.   3. Hyperlipidemia, unspecified hyperlipidemia type Continue statin as ordered and reviewed, no changes at this time   4. Tobacco abuse Smoking cessation was discussed, 3-10 minutes spent on this topic specifically    Current Outpatient Medications on File Prior to Visit  Medication Sig Dispense Refill   apixaban (ELIQUIS) 5 MG TABS tablet Take 1 tablet (5 mg total) by mouth 2 (two) times daily. 180 tablet 1   Artificial Tear Solution (SOOTHE XP) SOLN Place 1 drop into both eyes 3 (three) times daily as needed (dry eyes).     ferrous sulfate 325 (65 FE) MG tablet Take 325 mg by mouth 2 (two) times daily with a meal.     finasteride (PROSCAR) 5 MG tablet Take 5 mg by mouth daily.     glimepiride (AMARYL) 4 MG tablet Take 4 mg by mouth in the morning and at bedtime.  hydrochlorothiazide (HYDRODIURIL) 25 MG tablet Take 25 mg by mouth daily.     JANUVIA 100 MG tablet Take 100 mg by mouth daily.     Misc Natural Products (URINOZINC PLUS PO) Take 1 tablet by mouth 2 (two) times daily.      telmisartan (MICARDIS) 80 MG tablet Take 80 mg by mouth daily.     No current facility-administered medications on file prior to visit.    There are no Patient Instructions on file for  this visit. No follow-ups on file.   Kris Hartmann, NP

## 2022-01-08 NOTE — H&P (View-Only) (Signed)
Subjective:    Patient ID: Jonathon Barrera, male    DOB: 21-May-1940, 81 y.o.   MRN: [id number] No chief complaint on file.   The patient returns to the office for followup and review of the noninvasive studies.  The patient is following up sooner than previously scheduled.  He notes that he has been having some severe pain in his right leg.  He notes that it is worse with activity.  He is unable to do normal things such as mowing his lawn or walk up stairs and inclines.  The patient notes that there has been a significant deterioration in the lower extremity symptoms.  The patient notes interval shortening of their claudication distance and development of mild rest pain symptoms. No new ulcers or wounds have occurred since the last visit.  There have been no significant changes to the patient's overall health care.  The patient denies amaurosis fugax or recent TIA symptoms. There are no recent neurological changes noted. There is no history of DVT, PE or superficial thrombophlebitis. The patient denies recent episodes of angina or shortness of breath.   ABI's Rt= 0.56 and Lt= 1.77 (previous ABI's Rt= 1.15 and Lt= 0.79) Duplex US of the lower extremity arterial system shows occlusion of the proximal SFA as well as proximal popliteal artery.  Previously placed SFA stent is occluded with retrograde flow to the distal area.    Review of Systems  Cardiovascular:        Claudication  All other systems reviewed and are negative.      Objective:   Physical Exam Vitals reviewed.  HENT:     Head: Normocephalic.  Cardiovascular:     Rate and Rhythm: Normal rate.     Pulses:          Dorsalis pedis pulses are detected w/ Doppler on the left side.       Posterior tibial pulses are detected w/ Doppler on the left side.  Pulmonary:     Effort: Pulmonary effort is normal.  Skin:    General: Skin is warm and dry.  Neurological:     Mental Status: He is alert and oriented to person, place,  and time.  Psychiatric:        Mood and Affect: Mood normal.        Behavior: Behavior normal.        Thought Content: Thought content normal.        Judgment: Judgment normal.     BP (!) 170/90 (BP Location: Left Arm)   Pulse 61   Resp 18   Ht '5\' 9"'$  (1.753 m)   Wt 185 lb (83.9 kg)   BMI 27.32 kg/m   Past Medical History:  Diagnosis Date   Actinic keratosis    Acute deep vein thrombosis (DVT) of distal vein of right lower extremity ([place]) 06/19/2020   Aortic atherosclerosis (HCC)    Bladder carcinoma ([place]) 05/26/2007   a.) papillary-transitional cell   BPH (benign prostatic hyperplasia)    Colon polyps    COPD (chronic obstructive pulmonary disease) (HCC)    Current use of long term anticoagulation    a.) Apixaban   Diverticulosis    DOE (dyspnea on exertion)    HOH (hard of hearing)    a.) uses BILATERAL assistive devices   Hyperlipemia    Hypertension    Mass    Lt lower cheek   Melanoma ([place]) 2000   Tx by Dr Koleen Nimrod   PAD (peripheral artery  disease) ([place])    Peripheral vascular disease ([place])    Pulmonary embolism ([place]) 06/19/2020   a.) RLL; no associated RIGHT heart strain   RBBB (right bundle branch block)    Squamous cell carcinoma of skin 02/01/2017   Left dorsum latera. hand. EDC   Squamous cell carcinoma of skin 02/01/2017   Left dorsum base of thumb. EDC   T2DM (type 2 diabetes mellitus) ([place])     Social History   Socioeconomic History   Marital status: Married    Spouse name: Not on file   Number of children: Not on file   Years of education: Not on file   Highest education level: Not on file  Occupational History   Not on file  Tobacco Use   Smoking status: Every Day    Packs/day: 0.50    Types: Cigarettes   Smokeless tobacco: Never  Vaping Use   Vaping Use: Never used  Substance and Sexual Activity   Alcohol use: Yes    Alcohol/week: 6.0 standard drinks of alcohol    Types: 6 Cans of beer per week   Drug use: No   Sexual activity:  Not on file  Other Topics Concern   Not on file  Social History Narrative   Lives in [place]; with wife. 2 children/near by. Smokes 1/2 ppd; 3-4 beers/weekend. Retired in 2010; Systems analyst.    Social Determinants of Health   Financial Resource Strain: Not on file  Food Insecurity: Not on file  Transportation Needs: Not on file  Physical Activity: Not on file  Stress: Not on file  Social Connections: Not on file  Intimate Partner Violence: Not on file    Past Surgical History:  Procedure Laterality Date   back skin cancer     BLADDER SURGERY     CARDIAC CATHETERIZATION     CATARACT EXTRACTION W/PHACO Right 04/30/2015   Procedure: CATARACT EXTRACTION PHACO AND INTRAOCULAR LENS PLACEMENT (IOC);  Surgeon: Birder Robson, MD;  Location: [facility] ORS;  Service: Ophthalmology;  Laterality: Right;  [place] 01:03 AP% 21.4 CDE  13.57 fluid pack lot # [id number]   CATARACT EXTRACTION W/PHACO Left 05/21/2015   Procedure: CATARACT EXTRACTION PHACO AND INTRAOCULAR LENS PLACEMENT (IOC);  Surgeon: Birder Robson, MD;  Location: [facility] ORS;  Service: Ophthalmology;  Laterality: Left;  [place] 00:52 AP% 18.9 CDE 9.95 fluid pack lot #[id number]   COLONOSCOPY WITH PROPOFOL N/A 11/05/2014   Procedure: COLONOSCOPY WITH PROPOFOL;  Surgeon: Hulen Luster, MD;  [facility] END[facility]COPY;  Service: Gastroenterology;  Laterality: N/A;   COLONOSCOPY WITH PROPOFOL N/A 05/19/2021   Procedure: COLONOSCOPY WITH PROPOFOL;  Surgeon: Annamaria Helling, DO;  Location: [facility] ENDOSCOPY;  Service: Gastroenterology;  Laterality: N/A;  DM Patient is hard of hearing and would like for wife to answer questions, please.   ENDARTERECTOMY FEMORAL Right 02/26/2021   Procedure: ENDARTERECTOMY FEMORAL (SFA STENT PLACEMEN);  Surgeon: Algernon Huxley, MD;  Location: [facility] ORS;  Service: Vascular;  Laterality: Right;   ESOPHAGOGASTRODUODENOSCOPY (EGD) WITH PROPOFOL N/A 05/19/2021   Procedure: ESOPHAGOGASTRODUODENOSCOPY (EGD) WITH  PROPOFOL;  Surgeon: Annamaria Helling, DO;  Location: [place];  Service: Gastroenterology;  Laterality: N/A;   HERNIA REPAIR     inguinal   KYPHOPLASTY N/A 02/23/2019   Procedure: T12 KYPHOPLASTY;  Surgeon: Hessie Knows, MD;  Location: [facility] ORS;  Service: Orthopedics;  Laterality: N/A;   LESION EXCISION N/A 02/23/2018   Procedure: EXCISION SCALP CYST AND FACIAL CYST;  Surgeon: Herbert Pun, MD;  Location:  [facility] ORS;  Service: General;  Laterality: N/A;   LOWER EXTREMITY ANGIOGRAPHY Right 01/23/2021   Procedure: LOWER EXTREMITY ANGIOGRAPHY;  Surgeon: Algernon Huxley, MD;  Location: [place] [facility];  Service: Cardiovascular;  Laterality: Right;   SHOULDER ARTHROSCOPY W/ ROTATOR CUFF REPAIR Right    TUR-BT      Family History  Problem Relation Age of Onset   Diabetes Mother    Colon cancer Mother    Diabetes Father     No Known Allergies     Latest Ref Rng & Units 09/19/2021   10:53 AM 07/04/2021   12:58 PM 06/11/2021   12:25 PM  CBC  WBC 4.0 - 10.5 K/uL 4.1  4.1  4.6   Hemoglobin 13.0 - 17.0 g/dL 12.8  11.8  10.1   Hematocrit 39.0 - 52.0 % 38.2  36.1  32.9   Platelets 150 - 400 K/uL 124  182  203       CMP     Component Value Date/Time   NA 139 09/19/2021 1053   K 4.0 09/19/2021 1053   K 3.9 02/23/2012 0928   CL 104 09/19/2021 1053   CO2 29 09/19/2021 1053   GLUCOSE 127 (H) 09/19/2021 1053   BUN 23 09/19/2021 1053   CREATININE 1.06 09/19/2021 1053   CALCIUM 9.0 09/19/2021 1053   PROT 8.4 (H) 06/11/2021 1225   ALBUMIN 4.2 06/11/2021 1225   AST 27 06/11/2021 1225   ALT 30 06/11/2021 1225   ALKPHOS 79 06/11/2021 1225   BILITOT 0.4 06/11/2021 1225   GFRNONAA >60 09/19/2021 1053     No results found.     Assessment & Plan:   1. Peripheral arterial disease with history of revascularization (HCC) Recommend:  The patient has evidence of severe atherosclerotic changes of both lower extremities with rest pain that is associated with preulcerative  changes and impending tissue loss of the right foot.  This represents a limb threatening ischemia and places the patient at the risk for right limb loss.  Patient should undergo angiography of the right lower extremity with the hope for intervention for limb salvage.  The risks and benefits as well as the alternative therapies was discussed in detail with the patient.  All questions were answered.  Patient agrees to proceed with right lower extremity angiography.  The patient will follow up with me in the office after the procedure.      - VAS [place] ABI WITH/WO TBI  2. Primary hypertension Continue antihypertensive medications as already ordered, these medications have been reviewed and there are no changes at this time.   3. Hyperlipidemia, unspecified hyperlipidemia type Continue statin as ordered and reviewed, no changes at this time   4. Tobacco abuse Smoking cessation was discussed, 3-10 minutes spent on this topic specifically    Current Outpatient Medications on File Prior to Visit  Medication Sig Dispense Refill   apixaban (ELIQUIS) 5 MG TABS tablet Take 1 tablet (5 mg total) by mouth 2 (two) times daily. 180 tablet 1   Artificial Tear Solution (SOOTHE XP) SOLN Place 1 drop into both eyes 3 (three) times daily as needed (dry eyes).     ferrous sulfate 325 (65 FE) MG tablet Take 325 mg by mouth 2 (two) times daily with a meal.     finasteride (PROSCAR) 5 MG tablet Take 5 mg by mouth daily.     glimepiride (AMARYL) 4 MG tablet Take 4 mg by mouth in the morning and at bedtime.  hydrochlorothiazide (HYDRODIURIL) 25 MG tablet Take 25 mg by mouth daily.     JANUVIA 100 MG tablet Take 100 mg by mouth daily.     Misc Natural Products (URINOZINC PLUS PO) Take 1 tablet by mouth 2 (two) times daily.      telmisartan (MICARDIS) 80 MG tablet Take 80 mg by mouth daily.     No current facility-administered medications on file prior to visit.    There are no Patient Instructions on file for  this visit. No follow-ups on file.   Kris Hartmann, NP

## 2022-01-12 ENCOUNTER — Encounter: Payer: Self-pay | Admitting: Vascular Surgery

## 2022-01-12 ENCOUNTER — Encounter
Admission: RE | Disposition: A | Discharge status: Home / Self Care | Payer: Self-pay | Source: Home / Self Care | Attending: Vascular Surgery

## 2022-01-12 ENCOUNTER — Ambulatory Visit
Admission: RE | Admit: 2022-01-12 | Discharge: 2022-01-12 | Disposition: A | Discharge status: Home / Self Care | Payer: PPO | Attending: Vascular Surgery | Admitting: Vascular Surgery

## 2022-01-12 ENCOUNTER — Other Ambulatory Visit: Payer: Self-pay

## 2022-01-12 DIAGNOSIS — F17210 Nicotine dependence, cigarettes, uncomplicated: Secondary | ICD-10-CM | POA: Diagnosis not present

## 2022-01-12 DIAGNOSIS — I10 Essential (primary) hypertension: Secondary | ICD-10-CM | POA: Insufficient documentation

## 2022-01-12 DIAGNOSIS — T82856A Stenosis of peripheral vascular stent, initial encounter: Secondary | ICD-10-CM | POA: Diagnosis not present

## 2022-01-12 DIAGNOSIS — I70221 Atherosclerosis of native arteries of extremities with rest pain, right leg: Secondary | ICD-10-CM | POA: Diagnosis not present

## 2022-01-12 DIAGNOSIS — I70223 Atherosclerosis of native arteries of extremities with rest pain, bilateral legs: Secondary | ICD-10-CM | POA: Diagnosis not present

## 2022-01-12 DIAGNOSIS — E785 Hyperlipidemia, unspecified: Secondary | ICD-10-CM | POA: Diagnosis not present

## 2022-01-12 DIAGNOSIS — I70229 Atherosclerosis of native arteries of extremities with rest pain, unspecified extremity: Secondary | ICD-10-CM

## 2022-01-12 HISTORY — PX: LOWER EXTREMITY ANGIOGRAPHY: CATH118251

## 2022-01-12 LAB — CREATININE, SERUM
Creatinine, Ser: 0.89 mg/dL (ref 0.61–1.24)
GFR, Estimated: >60 mL/min (ref >60)

## 2022-01-12 LAB — GLUCOSE, CAPILLARY: Glucose-Capillary: 124 mg/dL — ABNORMAL HIGH (ref 70–99)

## 2022-01-12 LAB — BUN: BUN: 19 mg/dL (ref 8–23)

## 2022-01-12 SURGERY — LOWER EXTREMITY ANGIOGRAPHY
Anesthesia: Moderate Sedation | Site: Leg Lower | Laterality: Right

## 2022-01-12 MED ORDER — FENTANYL CITRATE (PF) 100 MCG/2ML IJ SOLN
INTRAMUSCULAR | Status: AC
  Filled 2022-01-12: qty 2

## 2022-01-12 MED ORDER — METHYLPREDNISOLONE SODIUM SUCC 125 MG IJ SOLR: 125.0000 mg | Freq: Once | INTRAMUSCULAR | Status: DC | PRN

## 2022-01-12 MED ORDER — ONDANSETRON HCL 4 MG/2ML IJ SOLN: 4.0000 mg | Freq: Four times a day (QID) | INTRAMUSCULAR | Status: DC | PRN

## 2022-01-12 MED ORDER — CEFAZOLIN SODIUM-DEXTROSE 1-4 GM/50ML-% IV SOLN
INTRAVENOUS | Status: DC | PRN
  Administered 2022-01-12: 2 g via INTRAVENOUS

## 2022-01-12 MED ORDER — IODIXANOL 320 MG/ML IV SOLN
INTRAVENOUS | Status: DC | PRN
  Administered 2022-01-12: 45 mL via INTRA_ARTERIAL

## 2022-01-12 MED ORDER — FENTANYL CITRATE (PF) 100 MCG/2ML IJ SOLN
INTRAMUSCULAR | Status: DC | PRN
  Administered 2022-01-12 (×2): 50 ug via INTRAVENOUS

## 2022-01-12 MED ORDER — SODIUM CHLORIDE 0.9 % IV SOLN: INTRAVENOUS | Status: DC

## 2022-01-12 MED ORDER — MIDAZOLAM HCL 2 MG/2ML IJ SOLN
INTRAMUSCULAR | Status: DC | PRN
  Administered 2022-01-12 (×2): 2 mg via INTRAVENOUS

## 2022-01-12 MED ORDER — MIDAZOLAM HCL 5 MG/5ML IJ SOLN
INTRAMUSCULAR | Status: AC
  Filled 2022-01-12: qty 5

## 2022-01-12 MED ORDER — FAMOTIDINE 20 MG PO TABS: 40.0000 mg | ORAL_TABLET | Freq: Once | ORAL | Status: DC | PRN

## 2022-01-12 MED ORDER — HEPARIN SODIUM (PORCINE) 1000 UNIT/ML IJ SOLN
INTRAMUSCULAR | Status: DC | PRN
  Administered 2022-01-12: 5000 [IU] via INTRAVENOUS

## 2022-01-12 MED ORDER — DIPHENHYDRAMINE HCL 50 MG/ML IJ SOLN: 50.0000 mg | Freq: Once | INTRAMUSCULAR | Status: DC | PRN

## 2022-01-12 MED ORDER — HYDROMORPHONE HCL 1 MG/ML IJ SOLN: 1.0000 mg | Freq: Once | INTRAMUSCULAR | Status: DC | PRN

## 2022-01-12 MED ORDER — MIDAZOLAM HCL 2 MG/ML PO SYRP: 8.0000 mg | ORAL_SOLUTION | Freq: Once | ORAL | Status: DC | PRN

## 2022-01-12 MED ORDER — CEFAZOLIN SODIUM-DEXTROSE 2-4 GM/100ML-% IV SOLN: 2.0000 g | INTRAVENOUS | Status: DC

## 2022-01-12 MED ORDER — HEPARIN SODIUM (PORCINE) 1000 UNIT/ML IJ SOLN
INTRAMUSCULAR | Status: AC
  Filled 2022-01-12: qty 10

## 2022-01-12 SURGICAL SUPPLY — 15 items
CATH ANGIO 5F PIGTAIL 65CM (CATHETERS) IMPLANT
CATH BEACON 5 .035 65 KMP TIP (CATHETERS) IMPLANT
CATH SEEKER .035X90 (CATHETERS) IMPLANT
DEVICE STARCLOSE SE CLOSURE (Vascular Products) IMPLANT
DEVICE TORQUE (MISCELLANEOUS) IMPLANT
DEVICE TORQUE .025-.038 (MISCELLANEOUS) IMPLANT
GLIDEWIRE ADV .035X260CM (WIRE) IMPLANT
GLIDEWIRE STIFF .35X180X3 HYDR (WIRE) IMPLANT
PACK ANGIOGRAPHY (CUSTOM PROCEDURE TRAY) ×1 IMPLANT
SHEATH BRITE TIP 5FRX11 (SHEATH) IMPLANT
SHEATH PINNACLE ST 6F 45CM (SHEATH) IMPLANT
SHEATH PROBE COVER 6X72 (BAG) IMPLANT
SYR MEDRAD MARK 7 150ML (SYRINGE) IMPLANT
TUBING CONTRAST HIGH PRESS 72 (TUBING) IMPLANT
WIRE GUIDERIGHT .035X150 (WIRE) IMPLANT

## 2022-01-12 NOTE — Op Note (Signed)
[facility]  Percutaneous Study/Intervention Procedural Note   Date of Surgery: 01/12/2022  Surgeon(s):Dalyce Renne    Assistants:none  Pre-operative Diagnosis: PAD with claudication and early rest Barrera right lower extremity  Post-operative diagnosis:  Same  Procedure(s) Performed:             1.  Ultrasound guidance for vascular access left femoral artery             2.  Catheter placement into right SFA from left femoral approach             3.  Aortogram and selective right lower extremity angiogram             4.  StarClose closure device left femoral artery  EBL: 5 cc  Contrast: 45 cc  Fluoro Time: 16 minutes  Moderate Conscious Sedation Time: approximately 54 minutes using 4 mg of Versed and 100 mcg of Fentanyl              Indications:  Patient is a 81 y.o.male with a previous history of femoral endarterectomy and SFA and popliteal stent placement with recurrent symptoms in the right leg now for weeks to months.  Noninvasive study showed occlusion of the SFA and popliteal stents with a markedly reduced right ABI. The patient is brought in for angiography for further evaluation and potential treatment.  Due to the limb threatening nature of the situation, angiogram was performed for attempted limb salvage. The patient is aware that if the procedure fails, amputation would be expected.  The patient also understands that even with successful revascularization, amputation may still be required due to the severity of the situation.  Risks and benefits are discussed and informed consent is obtained.   Procedure:  The patient was identified and appropriate procedural time out was performed.  The patient was then placed supine on the table and prepped and draped in the usual sterile fashion. Moderate conscious sedation was administered during a face to face encounter with the patient throughout the procedure with my supervision of the RN administering medicines and  monitoring the patient's vital signs, pulse oximetry, telemetry and mental status throughout from the start of the procedure until the patient was taken to the recovery room. Ultrasound was used to evaluate the left common femoral artery.  It was patent .  A digital ultrasound image was acquired.  A Seldinger needle was used to access the left common femoral artery under direct ultrasound guidance and a permanent image was performed.  A 0.035 J wire was advanced without resistance and a 5Fr sheath was placed.  Pigtail catheter was placed into the aorta and an AP aortogram was performed. This demonstrated normal renal arteries.  The aorta was mildly aneurysmal.  There appeared to be marked tortuosity and mild stenosis of both iliac systems a little worse on the left than the right, but nothing that appeared to be more than 50%. I then crossed the aortic bifurcation and advanced to the right femoral head. Selective right lower extremity angiogram was then performed. This demonstrated occlusion of the SFA at the bottom of the previous patch endarterectomy previously placed stents.  The SFA and popliteal stents were then occluded with reconstitution of the distal popliteal artery.  The posterior tibial artery filled the foot first but there did appear to be at least moderate stenosis in the proximal segment in the 70% range.  The peroneal artery and anterior tibial artery also appeared to have flow although it was  sluggish and only filling through collaterals. It was felt that it was in the patient's best interest to proceed with intervention after these images to avoid a second procedure and a larger amount of contrast and fluoroscopy based off of the findings from the initial angiogram. The patient was systemically heparinized and a 6 [place] destination sheath was then placed over the Terumo Advantage wire. I then used a Kumpe catheter and the advantage wire to get into the proximal SFA just into the occlusion.   Multiple oblique angles and images were performed but despite trying with a Kumpe and a seeker catheter and advantage Glidewire I could not get through the occlusion or get into the stents in the SFA.  After 15 minutes of fluoroscopy and no success, I felt potentially an attempt from a pedal approach on another day could be an option. I elected to terminate the procedure. The sheath was removed and StarClose closure device was deployed in the left femoral artery with excellent hemostatic result. The patient was taken to the recovery room in stable condition having tolerated the procedure well.  Findings:               Aortogram:  This demonstrated normal renal arteries.  The aorta was mildly aneurysmal.  There appeared to be marked tortuosity and mild stenosis of both iliac systems a little worse on the left than the right, but nothing that appeared to be more than 50%.             Right lower Extremity:  This demonstrated occlusion of the SFA at the bottom of the previous patch endarterectomy previously placed stents.  The SFA and popliteal stents were then occluded with reconstitution of the distal popliteal artery.  The posterior tibial artery filled the foot first but there did appear to be at least moderate stenosis in the proximal segment in the 70% range.  The peroneal artery and anterior tibial artery also appeared to have flow although it was sluggish and only filling through collaterals.   Disposition: Patient was taken to the recovery room in stable condition having tolerated the procedure well.  Complications: None  Jonathon Barrera 01/12/2022 1:35 PM   This note was created with Dragon Medical transcription system. Any errors in dictation are purely unintentional.

## 2022-01-12 NOTE — Interval H&P Note (Signed)
History and Physical Interval Note:  01/12/2022 11:24 AM  Jonathon Barrera  has presented today for surgery, with the diagnosis of RLE Angio  BARD   ASO w rest pain.  The various methods of treatment have been discussed with the patient and family. After consideration of risks, benefits and other options for treatment, the patient has consented to  Procedure(s): Lower Extremity Angiography (Right) as a surgical intervention.  The patient's history has been reviewed, patient examined, no change in status, stable for surgery.  I have reviewed the patient's chart and labs.  Questions were answered to the patient's satisfaction.     [name]

## 2022-01-13 ENCOUNTER — Encounter: Payer: Self-pay | Admitting: Vascular Surgery

## 2022-01-14 ENCOUNTER — Telehealth (INDEPENDENT_AMBULATORY_CARE_PROVIDER_SITE_OTHER): Payer: Self-pay

## 2022-01-14 NOTE — Telephone Encounter (Signed)
Spoke with the patient and his spouse and he is scheduled with Dr. Lucky Cowboy for a RLE angio with pedal approach on 01/26/22 with a 7:45 am arrival time to the MM. Pre-procedure instructions were discussed and will be mailed.

## 2022-01-14 NOTE — Telephone Encounter (Signed)
Jonathon Barrera Pts wife called and stated she needs to speak with [name].  Please give her a call.  She needs to get a procedure scheduled for her husband next week with Dr Lucky Cowboy.

## 2022-01-26 ENCOUNTER — Ambulatory Visit
Admission: RE | Admit: 2022-01-26 | Discharge: 2022-01-26 | Disposition: A | Discharge status: Home / Self Care | Payer: PPO | Attending: Vascular Surgery | Admitting: Vascular Surgery

## 2022-01-26 ENCOUNTER — Encounter: Payer: Self-pay | Admitting: Vascular Surgery

## 2022-01-26 ENCOUNTER — Encounter
Admission: RE | Disposition: A | Discharge status: Home / Self Care | Payer: Self-pay | Source: Home / Self Care | Attending: Vascular Surgery

## 2022-01-26 DIAGNOSIS — I70221 Atherosclerosis of native arteries of extremities with rest pain, right leg: Secondary | ICD-10-CM | POA: Insufficient documentation

## 2022-01-26 DIAGNOSIS — E785 Hyperlipidemia, unspecified: Secondary | ICD-10-CM | POA: Diagnosis not present

## 2022-01-26 DIAGNOSIS — Z9889 Other specified postprocedural states: Secondary | ICD-10-CM

## 2022-01-26 DIAGNOSIS — I10 Essential (primary) hypertension: Secondary | ICD-10-CM | POA: Diagnosis not present

## 2022-01-26 DIAGNOSIS — I7092 Chronic total occlusion of artery of the extremities: Secondary | ICD-10-CM

## 2022-01-26 DIAGNOSIS — F17210 Nicotine dependence, cigarettes, uncomplicated: Secondary | ICD-10-CM | POA: Insufficient documentation

## 2022-01-26 DIAGNOSIS — I70229 Atherosclerosis of native arteries of extremities with rest pain, unspecified extremity: Secondary | ICD-10-CM

## 2022-01-26 DIAGNOSIS — T82858A Stenosis of vascular prosthetic devices, implants and grafts, initial encounter: Secondary | ICD-10-CM

## 2022-01-26 HISTORY — PX: LOWER EXTREMITY ANGIOGRAPHY: CATH118251

## 2022-01-26 LAB — GLUCOSE, CAPILLARY: Glucose-Capillary: 134 mg/dL — ABNORMAL HIGH (ref 70–99)

## 2022-01-26 LAB — CREATININE, SERUM
Creatinine, Ser: 0.89 mg/dL (ref 0.61–1.24)
GFR, Estimated: >60 mL/min (ref >60)

## 2022-01-26 LAB — BUN: BUN: 28 mg/dL — ABNORMAL HIGH (ref 8–23)

## 2022-01-26 SURGERY — LOWER EXTREMITY ANGIOGRAPHY
Anesthesia: Moderate Sedation | Site: Leg Lower | Laterality: Right

## 2022-01-26 MED ORDER — SODIUM CHLORIDE 0.9 % IV SOLN: INTRAVENOUS | Status: DC

## 2022-01-26 MED ORDER — MIDAZOLAM HCL 2 MG/2ML IJ SOLN
INTRAMUSCULAR | Status: DC | PRN
  Administered 2022-01-26: 2 mg via INTRAVENOUS

## 2022-01-26 MED ORDER — FENTANYL CITRATE PF 50 MCG/ML IJ SOSY
PREFILLED_SYRINGE | INTRAMUSCULAR | Status: AC
  Filled 2022-01-26: qty 1

## 2022-01-26 MED ORDER — IODIXANOL 320 MG/ML IV SOLN
INTRAVENOUS | Status: DC | PRN
  Administered 2022-01-26: 40 mL

## 2022-01-26 MED ORDER — FENTANYL CITRATE (PF) 100 MCG/2ML IJ SOLN
INTRAMUSCULAR | Status: DC | PRN
  Administered 2022-01-26: 50 ug via INTRAVENOUS

## 2022-01-26 MED ORDER — DIPHENHYDRAMINE HCL 50 MG/ML IJ SOLN: 50.0000 mg | Freq: Once | INTRAMUSCULAR | Status: DC | PRN

## 2022-01-26 MED ORDER — CEFAZOLIN SODIUM-DEXTROSE 2-4 GM/100ML-% IV SOLN
2.0000 g | INTRAVENOUS | Status: AC
  Administered 2022-01-26: 2 g via INTRAVENOUS

## 2022-01-26 MED ORDER — MIDAZOLAM HCL 2 MG/2ML IJ SOLN
INTRAMUSCULAR | Status: AC
  Filled 2022-01-26: qty 2

## 2022-01-26 MED ORDER — HYDROMORPHONE HCL 1 MG/ML IJ SOLN: 1.0000 mg | Freq: Once | INTRAMUSCULAR | Status: DC | PRN

## 2022-01-26 MED ORDER — FAMOTIDINE 20 MG PO TABS: 40.0000 mg | ORAL_TABLET | Freq: Once | ORAL | Status: DC | PRN

## 2022-01-26 MED ORDER — HEPARIN SODIUM (PORCINE) 1000 UNIT/ML IJ SOLN
INTRAMUSCULAR | Status: DC | PRN
  Administered 2022-01-26: 3000 [IU] via INTRAVENOUS
  Administered 2022-01-26: 2000 [IU] via INTRAVENOUS

## 2022-01-26 MED ORDER — ONDANSETRON HCL 4 MG/2ML IJ SOLN: 4.0000 mg | Freq: Four times a day (QID) | INTRAMUSCULAR | Status: DC | PRN

## 2022-01-26 MED ORDER — MIDAZOLAM HCL 2 MG/ML PO SYRP: 8.0000 mg | ORAL_SOLUTION | Freq: Once | ORAL | Status: DC | PRN

## 2022-01-26 MED ORDER — HEPARIN SODIUM (PORCINE) 1000 UNIT/ML IJ SOLN
INTRAMUSCULAR | Status: AC
  Filled 2022-01-26: qty 10

## 2022-01-26 MED ORDER — METHYLPREDNISOLONE SODIUM SUCC 125 MG IJ SOLR: 125.0000 mg | Freq: Once | INTRAMUSCULAR | Status: DC | PRN

## 2022-01-26 MED ORDER — CEFAZOLIN SODIUM-DEXTROSE 2-4 GM/100ML-% IV SOLN
INTRAVENOUS | Status: AC
  Filled 2022-01-26: qty 100

## 2022-01-26 SURGICAL SUPPLY — 16 items
BAND ZEPHYR COMPRESS 30 LONG (HEMOSTASIS) IMPLANT
CATH BEACON 5 .035 65 KMP TIP (CATHETERS) IMPLANT
CATH BEACON 5 .038 100 VERT TP (CATHETERS) IMPLANT
CATH TEMPO 5F RIM 65CM (CATHETERS) IMPLANT
COVER PROBE ULTRASOUND 5X96 (MISCELLANEOUS) IMPLANT
DEVICE STARCLOSE SE CLOSURE (Vascular Products) IMPLANT
DRAPE INCISE IOBAN 66X45 STRL (DRAPES) IMPLANT
DRAPE TABLE BACK 80X90 (DRAPES) IMPLANT
GLIDEWIRE ADV .035X260CM (WIRE) IMPLANT
KIT ENCORE 26 ADVANTAGE (KITS) IMPLANT
NDL ENTRY 21GA 7CM ECHOTIP (NEEDLE) IMPLANT
NEEDLE ENTRY 21GA 7CM ECHOTIP (NEEDLE) ×1 IMPLANT
PACK ANGIOGRAPHY (CUSTOM PROCEDURE TRAY) ×1 IMPLANT
SHEATH BRITE TIP 5FRX11 (SHEATH) IMPLANT
SHEATH MICROPUNCTURE PEDAL 4FR (SHEATH) IMPLANT
WIRE GUIDERIGHT .035X150 (WIRE) IMPLANT

## 2022-01-26 NOTE — Op Note (Signed)
[facility]  Percutaneous Study/Intervention Procedural Note   Date of Surgery: 01/26/2022  Surgeon(s):Terell Kincy    Assistants:none  Pre-operative Diagnosis: PAD with rest pain RLE  Post-operative diagnosis:  Same  Procedure(s) Performed:             1.  Ultrasound guidance for vascular access left femoral artery and right posterior tibial artery             2.  Catheter placement into common femoral artery from left femoral approach and from right posterior tibial approach             3.  Selective right lower extremity angiogram             4.  StarClose closure device left femoral artery, TR band placement right posterior tibial artery  EBL: 10 cc  Contrast: 40 cc  Fluoro Time: 17 minutes  Moderate Conscious Sedation Time: approximately 57 minutes using 2 mg of Versed and 50 mcg of Fentanyl              Indications:  Patient is a 81 y.o.male with rest pain of the right lower extremity. The patient has had a previous femoral endarterectomy and SFA and popliteal stents.  These have been found to be occluded and previously had attempted to cross this from the left femoral approach without success.  He is brought back for a pedal access as well as a left femoral approach to see if we can get across the occlusion.  The patient is brought in for angiography for further evaluation and potential treatment.  Due to the limb threatening nature of the situation, angiogram was performed for attempted limb salvage. The patient is aware that if the procedure fails, amputation would be expected.  The patient also understands that even with successful revascularization, amputation may still be required due to the severity of the situation.  Risks and benefits are discussed and informed consent is obtained.   Procedure:  The patient was identified and appropriate procedural time out was performed.  The patient was then placed supine on the table and prepped and draped in the  usual sterile fashion. Moderate conscious sedation was administered during a face to face encounter with the patient throughout the procedure with my supervision of the RN administering medicines and monitoring the patient's vital signs, pulse oximetry, telemetry and mental status throughout from the start of the procedure until the patient was taken to the recovery room.  Initially, ultrasound was used to evaluate the right posterior tibial artery.  This was accessed under direct ultrasound guidance without difficulty with a micropuncture needle and a permanent image was recorded.  A 5 French low-profile sheath was then placed.  Imaging showed the posterior tibial and peroneal arteries to be open with mild disease in the tibioperoneal trunk to have mild disease.  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  There is occlusion of the popliteal artery just below the previously placed stent.  I then used a Kumpe cath and advantage wire to get up into the stent and get to the proximal edge of the stent in the proximal SFA where imaging showed there was occlusion.  The wire and catheter did not pass easily initially, so elected to access the other side for imaging and evaluation as well.  Ultrasound was used to evaluate the left common femoral artery.  It was patent .  A digital ultrasound image was acquired.  A Seldinger needle was used  to access the left common femoral artery under direct ultrasound guidance and a permanent image was performed.  A 0.035 J wire was advanced without resistance and a 5Fr sheath was placed.  I then used a Kumpe catheter and the advantage wire to from the left femoral approach into the right common femoral artery to try to get down into the SFA without success.  I continue to try to come from below with a Kumpe catheter and advantage wire from the SFA and could get into a subintimal plane in the common femoral artery but could never get back into the true lumen.  We  tried multiple different angles and obliques but never had any success getting across this occlusion in the proximal SFA from either approach.  We did reach nearly 20 minutes of fluoroscopy and it would appear that this is going to require open surgical therapy. I elected to terminate the procedure. The sheath was removed and StarClose closure device was deployed in the left femoral artery with excellent hemostatic result.  A TR band was placed on the right posterior tibial access site with excellent hemostatic result.  The patient was taken to the recovery room in stable condition having tolerated the procedure well.  Findings:                            Right Lower Extremity: Flush occlusion of the SFA at the end of the previously placed femoral endarterectomy patch with about a 2 to 3 cm gap down to the top of the SFA stents.  The stents were occluded all the way down to the popliteal artery there appeared to be occlusion just below the previously placed stent.  There was mild disease in the tibioperoneal trunk, peroneal artery, and posterior tibial arteries but these appear to be continuous distally.  The anterior tibial artery was heavily diseased and did not appear to be continuous to the foot.   Disposition: Patient was taken to the recovery room in stable condition having tolerated the procedure well.  Complications: None  [name] 01/26/2022 10:04 AM   This note was created with Dragon Medical transcription system. Any errors in dictation are purely unintentional.

## 2022-01-26 NOTE — Interval H&P Note (Signed)
History and Physical Interval Note:  01/26/2022 8:03 AM  Jonathon Barrera  has presented today for surgery, with the diagnosis of RLE Angio w pedal approach  BARD   ASO w rest pain.  The various methods of treatment have been discussed with the patient and family. After consideration of risks, benefits and other options for treatment, the patient has consented to  Procedure(s): Lower Extremity Angiography (Right) as a surgical intervention.  The patient's history has been reviewed, patient examined, no change in status, stable for surgery.  I have reviewed the patient's chart and labs.  Questions were answered to the patient's satisfaction.     [name]

## 2022-01-27 ENCOUNTER — Encounter: Payer: Self-pay | Admitting: Vascular Surgery

## 2022-01-27 DIAGNOSIS — E118 Type 2 diabetes mellitus with unspecified complications: Secondary | ICD-10-CM | POA: Diagnosis not present

## 2022-01-27 DIAGNOSIS — I2699 Other pulmonary embolism without acute cor pulmonale: Secondary | ICD-10-CM | POA: Diagnosis not present

## 2022-01-27 DIAGNOSIS — I10 Essential (primary) hypertension: Secondary | ICD-10-CM | POA: Diagnosis not present

## 2022-01-27 DIAGNOSIS — Z79899 Other long term (current) drug therapy: Secondary | ICD-10-CM | POA: Diagnosis not present

## 2022-01-27 DIAGNOSIS — Z23 Encounter for immunization: Secondary | ICD-10-CM | POA: Diagnosis not present

## 2022-01-27 DIAGNOSIS — E782 Mixed hyperlipidemia: Secondary | ICD-10-CM | POA: Diagnosis not present

## 2022-01-27 DIAGNOSIS — Z0000 Encounter for general adult medical examination without abnormal findings: Secondary | ICD-10-CM | POA: Diagnosis not present

## 2022-01-27 DIAGNOSIS — Z720 Tobacco use: Secondary | ICD-10-CM | POA: Diagnosis not present

## 2022-01-29 ENCOUNTER — Other Ambulatory Visit: Payer: PPO

## 2022-01-29 ENCOUNTER — Inpatient Hospital Stay: Payer: PPO | Attending: Internal Medicine

## 2022-01-29 DIAGNOSIS — D472 Monoclonal gammopathy: Secondary | ICD-10-CM | POA: Diagnosis not present

## 2022-01-29 DIAGNOSIS — Z86718 Personal history of other venous thrombosis and embolism: Secondary | ICD-10-CM | POA: Insufficient documentation

## 2022-01-29 DIAGNOSIS — F17210 Nicotine dependence, cigarettes, uncomplicated: Secondary | ICD-10-CM | POA: Insufficient documentation

## 2022-01-29 DIAGNOSIS — I824Z1 Acute embolism and thrombosis of unspecified deep veins of right distal lower extremity: Secondary | ICD-10-CM

## 2022-01-29 DIAGNOSIS — Z8639 Personal history of other endocrine, nutritional and metabolic disease: Secondary | ICD-10-CM | POA: Insufficient documentation

## 2022-01-29 DIAGNOSIS — Z86711 Personal history of pulmonary embolism: Secondary | ICD-10-CM | POA: Insufficient documentation

## 2022-01-29 DIAGNOSIS — Z7901 Long term (current) use of anticoagulants: Secondary | ICD-10-CM | POA: Insufficient documentation

## 2022-01-29 DIAGNOSIS — Z79899 Other long term (current) drug therapy: Secondary | ICD-10-CM | POA: Diagnosis not present

## 2022-01-29 DIAGNOSIS — D649 Anemia, unspecified: Secondary | ICD-10-CM

## 2022-01-29 LAB — BASIC METABOLIC PANEL
Anion gap: 5 (ref 5–15)
BUN: 28 mg/dL — ABNORMAL HIGH (ref 8–23)
CO2: 27 mmol/L (ref 22–32)
Calcium: 9.2 mg/dL (ref 8.9–10.3)
Chloride: 104 mmol/L (ref 98–111)
Creatinine, Ser: 0.91 mg/dL (ref 0.61–1.24)
GFR, Estimated: >60 mL/min (ref >60)
Glucose, Bld: 157 mg/dL — ABNORMAL HIGH (ref 70–99)
Potassium: 3.8 mmol/L (ref 3.5–5.1)
Sodium: 136 mmol/L (ref 135–145)

## 2022-01-29 LAB — IRON AND TIBC
Iron: 98 ug/dL (ref 45–182)
Saturation Ratios: 29 % (ref 17.9–39.5)
TIBC: 336 ug/dL (ref 250–450)
UIBC: 238 ug/dL

## 2022-01-29 LAB — CBC WITH DIFFERENTIAL/PLATELET
Abs Immature Granulocytes: 0.02 10*3/uL (ref 0.00–0.07)
Basophils Absolute: 0.1 10*3/uL (ref 0.0–0.1)
Basophils Relative: 2 %
Eosinophils Absolute: 0.2 10*3/uL (ref 0.0–0.5)
Eosinophils Relative: 4 %
HCT: 37.8 % — ABNORMAL LOW (ref 39.0–52.0)
Hemoglobin: 13 g/dL (ref 13.0–17.0)
Immature Granulocytes: 0 %
Lymphocytes Relative: 25 %
Lymphs Abs: 1.1 10*3/uL (ref 0.7–4.0)
MCH: 33.3 pg (ref 26.0–34.0)
MCHC: 34.4 g/dL (ref 30.0–36.0)
MCV: 96.9 fL (ref 80.0–100.0)
Monocytes Absolute: 0.5 10*3/uL (ref 0.1–1.0)
Monocytes Relative: 10 %
Neutro Abs: 2.7 10*3/uL (ref 1.7–7.7)
Neutrophils Relative %: 59 %
Platelets: 134 10*3/uL — ABNORMAL LOW (ref 150–400)
RBC: 3.9 MIL/uL — ABNORMAL LOW (ref 4.22–5.81)
RDW: 13.3 % (ref 11.5–15.5)
WBC: 4.5 10*3/uL (ref 4.0–10.5)
nRBC: 0 % (ref 0.0–0.2)

## 2022-01-29 LAB — FERRITIN: Ferritin: 58 ng/mL (ref 24–336)

## 2022-01-30 LAB — KAPPA/LAMBDA LIGHT CHAINS
Kappa free light chain: 293.7 mg/L — ABNORMAL HIGH (ref 3.3–19.4)
Kappa, lambda light chain ratio: 33 — ABNORMAL HIGH (ref 0.26–1.65)
Lambda free light chains: 8.9 mg/L (ref 5.7–26.3)

## 2022-02-03 LAB — MULTIPLE MYELOMA PANEL, SERUM
Albumin SerPl Elph-Mcnc: 3.9 g/dL (ref 2.9–4.4)
Albumin/Glob SerPl: 1.2 (ref 0.7–1.7)
Alpha 1: 0.2 g/dL (ref 0.0–0.4)
Alpha2 Glob SerPl Elph-Mcnc: 0.8 g/dL (ref 0.4–1.0)
B-Globulin SerPl Elph-Mcnc: 0.9 g/dL (ref 0.7–1.3)
Gamma Glob SerPl Elph-Mcnc: 1.7 g/dL (ref 0.4–1.8)
Globulin, Total: 3.5 g/dL (ref 2.2–3.9)
IgA: 75 mg/dL (ref 61–437)
IgG (Immunoglobin G), Serum: 1975 mg/dL — ABNORMAL HIGH (ref 603–1613)
IgM (Immunoglobulin M), Srm: 163 mg/dL — ABNORMAL HIGH (ref 15–143)
M Protein SerPl Elph-Mcnc: 1.3 g/dL — ABNORMAL HIGH
Total Protein ELP: 7.4 g/dL (ref 6.0–8.5)

## 2022-02-10 MED FILL — Iron Sucrose Inj 20 MG/ML (Fe Equiv): INTRAVENOUS | Qty: 10 | Status: AC

## 2022-02-11 ENCOUNTER — Encounter: Payer: Self-pay | Admitting: Internal Medicine

## 2022-02-11 ENCOUNTER — Inpatient Hospital Stay: Payer: PPO

## 2022-02-11 ENCOUNTER — Inpatient Hospital Stay (HOSPITAL_BASED_OUTPATIENT_CLINIC_OR_DEPARTMENT_OTHER): Payer: PPO | Admitting: Internal Medicine

## 2022-02-11 DIAGNOSIS — D472 Monoclonal gammopathy: Secondary | ICD-10-CM | POA: Diagnosis not present

## 2022-02-11 DIAGNOSIS — D649 Anemia, unspecified: Secondary | ICD-10-CM | POA: Diagnosis not present

## 2022-02-11 NOTE — Progress Notes (Signed)
Pt in for follow up, denies any concerns today. 

## 2022-02-11 NOTE — Progress Notes (Signed)
[place] NOTE  Patient Care Team: Idelle Crouch, MD as PCP - General (Internal Medicine) Cammie Sickle, MD as Consulting Physician (Oncology)  CHIEF COMPLAINTS/PURPOSE OF CONSULTATION: ANEMIA  HEMATOLOGY HISTORY:  # ANEMIA NORMOCYTIC  [JAN 2023- PCP-hemoglobin 8.7; saturation 7%; ferritin 23-LL-28]EGD/ colonoscopy-JAN 2023 [[facility]]; NO CT-A/P or capsule.   # MARCH 2023-possible MGUS; IgG kappa 1.6 g; kappa/lambda light chain ratio 33  March 2022-PE small volume right lower lobe; and right lower extremity above-knee DVT.  On Eliquis; OFF in March 2023; JUNE 15th-DVT-femoral/popliteal nonocclusive-started on Eliquis- INDEFINITE   # Hx of PVD [Dr.Dew]   Latest Reference Range & Units Most Recent 06/11/21 12:25 09/19/21 10:53  Kappa free light chain 3.3 - 19.4 mg/L 246.9 (H) 09/19/21 10:53 351.3 (H) 246.9 (H)  Lambda free light chains 5.7 - 26.3 mg/L 8.9 09/19/21 10:53 10.5 8.9  Kappa, lambda light chain ratio 0.26 - 1.65  27.74 (H) 09/19/21 10:53 33.46 (H) 27.74 (H)  (H): Data is abnormally high   HISTORY OF PRESENTING ILLNESS: Patient accompanied by his wife.  Ambulating independently.  Slightly hard of hearing.  Jonathon Barrera 81 y.o.  male iron deficient anemia-unclear etiology; history of DVT/PE-recurrent on eliquis; MGUS is here IV iron infusions for iron deficiency.  Pt in for follow up, denies any concerns today. Continue for PO iron. No pain Patient noted to have improvement of his energy levels.     Review of Systems  Constitutional:  Positive for malaise/fatigue. Negative for chills, diaphoresis, fever and weight loss.  HENT:  Negative for nosebleeds and sore throat.   Eyes:  Negative for double vision.  Respiratory:  Negative for cough, hemoptysis, sputum production, shortness of breath and wheezing.   Cardiovascular:  Negative for chest pain, palpitations, orthopnea and leg swelling.  Gastrointestinal:  Negative for abdominal pain,  blood in stool, constipation, diarrhea, heartburn, melena, nausea and vomiting.  Genitourinary:  Negative for dysuria, frequency and urgency.  Musculoskeletal:  Positive for joint pain. Negative for back pain.  Skin: Negative.  Negative for itching and rash.  Neurological:  Negative for dizziness, tingling, focal weakness, weakness and headaches.  Endo/Heme/Allergies:  Does not bruise/bleed easily.  Psychiatric/Behavioral:  Negative for depression. The patient is not nervous/anxious and does not have insomnia.     MEDICAL HISTORY:  Past Medical History:  Diagnosis Date   Actinic keratosis    Acute deep vein thrombosis (DVT) of distal vein of right lower extremity ([place]) 06/19/2020   Aortic atherosclerosis (HCC)    Bladder carcinoma ([place]) 05/26/2007   a.) papillary-transitional cell   BPH (benign prostatic hyperplasia)    Colon polyps    COPD (chronic obstructive pulmonary disease) (HCC)    Current use of long term anticoagulation    a.) Apixaban   Diverticulosis    DOE (dyspnea on exertion)    HOH (hard of hearing)    a.) uses BILATERAL assistive devices   Hyperlipemia    Hypertension    Mass    Lt lower cheek   Melanoma ([place]) 2000   Tx by Dr Koleen Nimrod   PAD (peripheral artery disease) [facility])    Peripheral vascular disease ([place])    Pulmonary embolism ([place]) 06/19/2020   a.) RLL; no associated RIGHT heart strain   RBBB (right bundle branch block)    Squamous cell carcinoma of skin 02/01/2017   Left dorsum latera. hand. [facility]   Squamous cell carcinoma of skin 02/01/2017   Left dorsum base of thumb. [place]  T2DM (type 2 diabetes mellitus) ([place])     SURGICAL HISTORY: Past Surgical History:  Procedure Laterality Date   back skin cancer     BLADDER SURGERY     CARDIAC CATHETERIZATION     CATARACT EXTRACTION W/PHACO Right 04/30/2015   Procedure: CATARACT EXTRACTION PHACO AND INTRAOCULAR LENS PLACEMENT (IOC);  Surgeon: Birder Robson, MD;  Location: [facility] ORS;  Service:  Ophthalmology;  Laterality: Right;  [place] 01:03 AP% 21.4 CDE  13.57 fluid pack lot # [id number]   CATARACT EXTRACTION W/PHACO Left 05/21/2015   Procedure: CATARACT EXTRACTION PHACO AND INTRAOCULAR LENS PLACEMENT (IOC);  Surgeon: Birder Robson, MD;  Location: [facility] ORS;  Service: Ophthalmology;  Laterality: Left;  [place] 00:52 AP% 18.9 CDE 9.95 fluid pack lot #[id number]   COLONOSCOPY WITH PROPOFOL N/A 11/05/2014   Procedure: COLONOSCOPY WITH PROPOFOL;  Surgeon: Hulen Luster, MD;  Location: [facility] ENDOSCOPY;  Service: Gastroenterology;  Laterality: N/A;   COLONOSCOPY WITH PROPOFOL N/A 05/19/2021   Procedure: COLONOSCOPY WITH PROPOFOL;  Surgeon: Annamaria Helling, DO;  Location: [facility] ENDOSCOPY;  Service: Gastroenterology;  Laterality: N/A;  DM Patient is hard of hearing and would like for wife to answer questions, please.   ENDARTERECTOMY FEMORAL Right 02/26/2021   Procedure: ENDARTERECTOMY FEMORAL (SFA STENT PLACEMEN);  Surgeon: Algernon Huxley, MD;  Location: [facility] ORS;  Service: Vascular;  Laterality: Right;   ESOPHAGOGASTRODUODENOSCOPY (EGD) WITH PROPOFOL N/A 05/19/2021   Procedure: ESOPHAGOGASTRODUODENOSCOPY (EGD) WITH PROPOFOL;  Surgeon: Annamaria Helling, DO;  Location: [place];  Service: Gastroenterology;  Laterality: N/A;   HERNIA REPAIR     inguinal   KYPHOPLASTY N/A 02/23/2019   Procedure: T12 KYPHOPLASTY;  Surgeon: Hessie Knows, MD;  Location: [facility] ORS;  Service: Orthopedics;  Laterality: N/A;   LESION EXCISION N/A 02/23/2018   Procedure: EXCISION SCALP CYST AND FACIAL CYST;  Surgeon: Herbert Pun, MD;  Location: [facility] ORS;  Service: General;  Laterality: N/A;   LOWER EXTREMITY ANGIOGRAPHY Right 01/23/2021   Procedure: LOWER EXTREMITY ANGIOGRAPHY;  Surgeon: Algernon Huxley, MD;  Location: [place] CV LAB;  Service: Cardiovascular;  Laterality: Right;   LOWER EXTREMITY ANGIOGRAPHY Right 01/12/2022   Procedure: Lower Extremity Angiography;  Surgeon: Algernon Huxley, MD;  Location: [place] CV LAB;  Service: Cardiovascular;  Laterality: Right;   LOWER EXTREMITY ANGIOGRAPHY Right 01/26/2022   Procedure: Lower Extremity Angiography;  Surgeon: Algernon Huxley, MD;  Location: [place] CV LAB;  Service: Cardiovascular;  Laterality: Right;   SHOULDER ARTHROSCOPY W/ ROTATOR CUFF REPAIR Right    TUR-BT      SOCIAL HISTORY: Social History   Socioeconomic History   Marital status: Married    Spouse name: Not on file   Number of children: Not on file   Years of education: Not on file   Highest education level: Not on file  Occupational History   Not on file  Tobacco Use   Smoking status: Every Day    Packs/day: 0.50    Types: Cigarettes   Smokeless tobacco: Never  Vaping Use   Vaping Use: Never used  Substance and Sexual Activity   Alcohol use: Yes    Alcohol/week: 6.0 standard drinks of alcohol    Types: 6 Cans of beer per week   Drug use: No   Sexual activity: Not on file  Other Topics Concern   Not on file  Social History Narrative   Lives in [place]; with wife. 2 children/near by. Smokes 1/2 ppd; 3-4 beers/weekend. Retired in 2010;  Film/video editor belts.    Social Determinants of Health   Financial Resource Strain: Not on file  Food Insecurity: Not on file  Transportation Needs: Not on file  Physical Activity: Not on file  Stress: Not on file  Social Connections: Not on file  Intimate Partner Violence: Not on file    FAMILY HISTORY: Family History  Problem Relation Age of Onset   Diabetes Mother    Colon cancer Mother    Diabetes Father     ALLERGIES:  has No Known Allergies.  MEDICATIONS:  Current Outpatient Medications  Medication Sig Dispense Refill   apixaban (ELIQUIS) 5 MG TABS tablet Take 1 tablet (5 mg total) by mouth 2 (two) times daily. 180 tablet 1   Artificial Tear Solution (SOOTHE XP) SOLN Place 1 drop into both eyes 3 (three) times daily as needed (dry eyes).     ferrous sulfate 325 (65 FE) MG tablet Take 325  mg by mouth 2 (two) times daily with a meal.     finasteride (PROSCAR) 5 MG tablet Take 5 mg by mouth daily.     glimepiride (AMARYL) 4 MG tablet Take 4 mg by mouth in the morning and at bedtime.     hydrochlorothiazide (HYDRODIURIL) 25 MG tablet Take 25 mg by mouth daily.     Misc Natural Products (URINOZINC PLUS PO) Take 1 tablet by mouth 2 (two) times daily.      telmisartan (MICARDIS) 80 MG tablet Take 80 mg by mouth daily.     No current facility-administered medications for this visit.      PHYSICAL EXAMINATION:   Vitals:   02/11/22 1326  BP: (!) 151/76  Pulse: 61  Resp: 16  Temp: 98 F (36.7 C)  SpO2: 99%   Filed Weights   02/11/22 1326  Weight: 186 lb (84.4 kg)    Physical Exam Vitals and nursing note reviewed.  HENT:     Head: Normocephalic and atraumatic.     Mouth/Throat:     Pharynx: Oropharynx is clear.  Eyes:     Extraocular Movements: Extraocular movements intact.     Pupils: Pupils are equal, round, and reactive to light.  Cardiovascular:     Rate and Rhythm: Normal rate and regular rhythm.  Pulmonary:     Comments: Decreased breath sounds bilaterally.  Abdominal:     Palpations: Abdomen is soft.  Musculoskeletal:        General: Normal range of motion.     Cervical back: Normal range of motion.  Skin:    General: Skin is warm.  Neurological:     General: No focal deficit present.     Mental Status: He is alert and oriented to person, place, and time.  Psychiatric:        Behavior: Behavior normal.        Judgment: Judgment normal.     LABORATORY DATA:  I have reviewed the data as listed Lab Results  Component Value Date   WBC 4.5 01/29/2022   HGB 13.0 01/29/2022   HCT 37.8 (L) 01/29/2022   MCV 96.9 01/29/2022   PLT 134 (L) 01/29/2022   Recent Labs    06/11/21 1225 09/19/21 1053 01/12/22 1134 01/26/22 0820 01/29/22 0755  NA 135 139  --   --  136  K 3.7 4.0  --   --  3.8  CL 99 104  --   --  104  CO2 28 29  --   --  27   GLUCOSE  150* 127*  --   --  157*  BUN 21 23 19  28* 28*  CREATININE 0.97 1.06 0.89 0.89 0.91  CALCIUM 9.5 9.0  --   --  9.2  GFRNONAA >60 >60 >60 >60 >60  PROT 8.4*  --   --   --   --   ALBUMIN 4.2  --   --   --   --   AST 27  --   --   --   --   ALT 30  --   --   --   --   ALKPHOS 79  --   --   --   --   BILITOT 0.4  --   --   --   --      PERIPHERAL VASCULAR CATHETERIZATION  Result Date: 01/26/2022 See surgical note for result.   Symptomatic anemia # Moderate to severe anemia/likely iron deficiency- hemoglobin 8; MCV normal; platelets normal [FEB 2023; PCP]-ferritin-16 [LN-24; iron saturation 7%]; normal white count/platelets.  S/p IV Venofer-  # hemoglobin improved to  13.2  Hold IV iron infusion.  Continue gentle iron Twice a day.   # MGUS:  ELEVATED M protein:MARCH 2023- IgGK- 1.6 /dl; K/L= 33 [anemia-see above; no hypercalcemia no renal insufficiency; ?  Bone lesions-no x-rays done]. OCT  2023- M 1.4; K/L ratio: 29 ; STABLE.  HOLD bone marrow biopsy.  Patient in agreement.  # March 2022 [Dr.[place]]-small volume PE/ RIGHT LE DVT- Positive for deep venous thrombosis in the right lower extremity [Above Knee- fem]; August 2022 repeat ultrasound-improved not resolved. OFF Eliquis [since March].  October 02, 2021 DVT-femoral/popliteal- on Eliquis 5 mg BID.    Given recurrent DVT; prior history of PE-patient likely need long-term anticoagulation.   # Intermittent thrombocytopenia isolated 134 mild monitor for now-while on Eliquis.   # Hx o PVD [s/p stenting]- on eliquis-n [Dr.Dew]  # DISPOSITION: # HOLD venofer  # follow up in 6 months 2 weeks prior-; MD; labs- cbc/bmp; MM panel; K/L light chains; iro studies; ferritin-/possible venofer-Dr.B   [facility]; [place]    All questions were answered. The patient knows to call the clinic with any problems, questions or concerns.    Cammie Sickle, MD 02/11/2022 11:23 PM

## 2022-02-11 NOTE — Assessment & Plan Note (Addendum)
#  Moderate to severe anemia/likely iron deficiency- hemoglobin 8; MCV normal; platelets normal [FEB 2023; PCP]-ferritin-16 [LN-24; iron saturation 7%]; normal white count/platelets.  S/p IV Venofer-  # hemoglobin improved to  13.2  Hold IV iron infusion.  Continue gentle iron Twice a day.   # MGUS:  ELEVATED M protein:MARCH 2023- IgGK- 1.6 /dl; K/L= 33 [anemia-see above; no hypercalcemia no renal insufficiency; ?  Bone lesions-no x-rays done]. OCT  2023- M 1.4; K/L ratio: 29 ; STABLE.  HOLD bone marrow biopsy.  Patient in agreement.  # March 2022 [Dr.Sparks]-small volume PE/ RIGHT LE DVT- Positive for deep venous thrombosis in the right lower extremity [Above Knee- fem]; August 2022 repeat ultrasound-improved not resolved. OFF Eliquis [since March].  October 02, 2021 DVT-femoral/popliteal- on Eliquis 5 mg BID.    Given recurrent DVT; prior history of PE-patient likely need long-term anticoagulation.   # Intermittent thrombocytopenia isolated 134 mild monitor for now-while on Eliquis.   # Hx o PVD [s/p stenting]- on eliquis-n [Dr.Dew]  # DISPOSITION: # HOLD venofer  # follow up in 6 months 2 weeks prior-; MD; labs- cbc/bmp; MM panel; K/L light chains; iro studies; ferritin-/possible venofer-Dr.B   [place]; [place]

## 2022-02-12 ENCOUNTER — Other Ambulatory Visit (INDEPENDENT_AMBULATORY_CARE_PROVIDER_SITE_OTHER): Payer: Self-pay | Admitting: Vascular Surgery

## 2022-02-12 DIAGNOSIS — Z01810 Encounter for preprocedural cardiovascular examination: Secondary | ICD-10-CM

## 2022-02-12 DIAGNOSIS — I70221 Atherosclerosis of native arteries of extremities with rest pain, right leg: Secondary | ICD-10-CM

## 2022-02-16 ENCOUNTER — Encounter (INDEPENDENT_AMBULATORY_CARE_PROVIDER_SITE_OTHER): Payer: Self-pay | Admitting: Nurse Practitioner

## 2022-02-16 ENCOUNTER — Ambulatory Visit (INDEPENDENT_AMBULATORY_CARE_PROVIDER_SITE_OTHER): Payer: PPO

## 2022-02-16 ENCOUNTER — Ambulatory Visit (INDEPENDENT_AMBULATORY_CARE_PROVIDER_SITE_OTHER): Payer: PPO | Admitting: Nurse Practitioner

## 2022-02-16 VITALS — BP 140/74 | HR 56 | Resp 16 | Wt 186.6 lb

## 2022-02-16 DIAGNOSIS — I10 Essential (primary) hypertension: Secondary | ICD-10-CM

## 2022-02-16 DIAGNOSIS — Z720 Tobacco use: Secondary | ICD-10-CM

## 2022-02-16 DIAGNOSIS — Z01810 Encounter for preprocedural cardiovascular examination: Secondary | ICD-10-CM | POA: Diagnosis not present

## 2022-02-16 DIAGNOSIS — Z9889 Other specified postprocedural states: Secondary | ICD-10-CM | POA: Diagnosis not present

## 2022-02-16 DIAGNOSIS — I70221 Atherosclerosis of native arteries of extremities with rest pain, right leg: Secondary | ICD-10-CM | POA: Diagnosis not present

## 2022-02-16 DIAGNOSIS — I739 Peripheral vascular disease, unspecified: Secondary | ICD-10-CM | POA: Diagnosis not present

## 2022-02-16 DIAGNOSIS — E118 Type 2 diabetes mellitus with unspecified complications: Secondary | ICD-10-CM | POA: Diagnosis not present

## 2022-02-16 NOTE — Progress Notes (Signed)
SUBJECTIVE:  Patient ID: Jonathon Barrera, male    DOB: 09-03-1940, 81 y.o.   MRN: [id number] Chief Complaint  Patient presents with   Follow-up    Ultrasound follow up    HPI  Jonathon Barrera is a 81 y.o. male who presents to clinic today post op unsuccessful lower extremity angiography on 01/12/2022 and 01/26/2022. He has had previous femoral endarterectomy and SFA and popliteal stents placed. He has had femoral approach as well as pedal approach to cross the occulusion unsuccessfully. In the right lower extremity a flush occlusion of the SFA at the end of the previously placed femoral endarterectomy patch with about a 2 to 3 cm gap down to the top of the SFA stents.  The stents were occluded all the way down to the popliteal artery and there appeared to be occlusion just below the previously placed stent. There was mild disease in the tibioperoneal trunk, peroneal artery, and posterior tibial arteries but these appear to be continuous distally.  The anterior tibial artery was heavily diseased and did not appear to be continuous to the foot.  Today he states he is still having claudication symptoms while walking. Most notably when walking up a hill or pushing the lawn mower. He states if he rests and elevates his symptoms will subside and he can return to activity. He denies any symptoms at rest. He denies any open sores or wounds to both lower extremities.   Patient states he has quit smoking 2 weeks ago. He plans to never smoke again. He states he is walking 4-5 times a day in his driveway. He is currently on 5 mg of Eliquis BID which he states he is taking. He is here today to discuss the results of an ultrasound of his lower extremities with saphenous vein mapping and options related to his claudication.   Past Medical History:  Diagnosis Date   Actinic keratosis    Acute deep vein thrombosis (DVT) of distal vein of right lower extremity ([place]) 06/19/2020   Aortic atherosclerosis (HCC)     Bladder carcinoma ([place]) 05/26/2007   a.) papillary-transitional cell   BPH (benign prostatic hyperplasia)    Colon polyps    COPD (chronic obstructive pulmonary disease) (HCC)    Current use of long term anticoagulation    a.) Apixaban   Diverticulosis    DOE (dyspnea on exertion)    HOH (hard of hearing)    a.) uses BILATERAL assistive devices   Hyperlipemia    Hypertension    Mass    Lt lower cheek   Melanoma ([place]) 2000   Tx by Dr Koleen Nimrod   PAD (peripheral artery disease) [facility])    Peripheral vascular disease ([place])    Pulmonary embolism ([place]) 06/19/2020   a.) RLL; no associated RIGHT heart strain   RBBB (right bundle branch block)    Squamous cell carcinoma of skin 02/01/2017   Left dorsum latera. hand. EDC   Squamous cell carcinoma of skin 02/01/2017   Left dorsum base of thumb. EDC   T2DM (type 2 diabetes mellitus) ([place])     Past Surgical History:  Procedure Laterality Date   back skin cancer     BLADDER SURGERY     CARDIAC CATHETERIZATION     CATARACT EXTRACTION W/PHACO Right 04/30/2015   Procedure: CATARACT EXTRACTION PHACO AND INTRAOCULAR LENS PLACEMENT ([place]);  Surgeon: Birder Robson, MD;  Location: [facility] ORS;  Service: Ophthalmology;  Laterality: Right;  [place] 01:03 AP% 21.4 CDE  13.57  fluid pack lot # [id number]   CATARACT EXTRACTION W/PHACO Left 05/21/2015   Procedure: CATARACT EXTRACTION PHACO AND INTRAOCULAR LENS PLACEMENT (IOC);  Surgeon: Birder Robson, MD;  Location: [facility] ORS;  Service: Ophthalmology;  Laterality: Left;  [place] 00:52 AP% 18.9 CDE 9.95 fluid pack lot #[id number]   COLONOSCOPY WITH PROPOFOL N/A 11/05/2014   Procedure: COLONOSCOPY WITH PROPOFOL;  Surgeon: Hulen Luster, MD;  Location: [facility] ENDOSCOPY;  Service: Gastroenterology;  Laterality: N/A;   COLONOSCOPY WITH PROPOFOL N/A 05/19/2021   Procedure: COLONOSCOPY WITH PROPOFOL;  Surgeon: Annamaria Helling, DO;  Location[facility] ENDOSCOPY;  Service: Gastroenterology;  Laterality: N/A;  DM Patient is hard  of hearing and would like for wife to answer questions, please.   ENDARTERECTOMY FEMORAL Right 02/26/2021   Procedure: ENDARTERECTOMY FEMORAL (SFA STENT PLACEMEN);  Surgeon: Algernon Huxley, MD;  Location: [facility] ORS;  Service: Vascular;  Laterality: Right;   ESOPHAGOGASTRODUODENOSCOPY (EGD) WITH PROPOFOL N/A 05/19/2021   Procedure: ESOPHAGOGASTRODUODENOSCOPY (EGD) WITH PROPOFOL;  Surgeon: Annamaria Helling, DO;  Location: [place];  Service: Gastroenterology;  Laterality: N/A;   HERNIA REPAIR     inguinal   KYPHOPLASTY N/A 02/23/2019   Procedure: T12 KYPHOPLASTY;  Surgeon: Hessie Knows, MD;  Location: [facility] ORS;  Service: Orthopedics;  Laterality: N/A;   LESION EXCISION N/A 02/23/2018   Procedure: EXCISION SCALP CYST AND FACIAL CYST;  Surgeon: Herbert Pun, MD;  Location: [facility] ORS;  Service: General;  Laterality: N/A;   LOWER EXTREMITY ANGIOGRAPHY Right 01/23/2021   Procedure: LOWER EXTREMITY ANGIOGRAPHY;  Surgeon: Algernon Huxley, MD;  Location: [place] CV LAB;  Service: Cardiovascular;  Laterality: Right;   LOWER EXTREMITY ANGIOGRAPHY Right 01/12/2022   Procedure: Lower Extremity Angiography;  Surgeon: Algernon Huxley, MD;  Location: [place] CV LAB;  Service: Cardiovascular;  Laterality: Right;   LOWER EXTREMITY ANGIOGRAPHY Right 01/26/2022   Procedure: Lower Extremity Angiography;  Surgeon: Algernon Huxley, MD;  Location: [place] CV LAB;  Service: Cardiovascular;  Laterality: Right;   SHOULDER ARTHROSCOPY W/ ROTATOR CUFF REPAIR Right    TUR-BT      Social History   Socioeconomic History   Marital status: Married    Spouse name: Not on file   Number of children: Not on file   Years of education: Not on file   Highest education level: Not on file  Occupational History   Not on file  Tobacco Use   Smoking status: Every Day    Packs/day: 0.50    Types: Cigarettes   Smokeless tobacco: Never  Vaping Use   Vaping Use: Never used  Substance and Sexual Activity    Alcohol use: Yes    Alcohol/week: 6.0 standard drinks of alcohol    Types: 6 Cans of beer per week   Drug use: No   Sexual activity: Not on file  Other Topics Concern   Not on file  Social History Narrative   Lives in [place]; with wife. 2 children/near by. Smokes 1/2 ppd; 3-4 beers/weekend. Retired in 2010; Systems analyst.    Social Determinants of Health   Financial Resource Strain: Not on file  Food Insecurity: Not on file  Transportation Needs: Not on file  Physical Activity: Not on file  Stress: Not on file  Social Connections: Not on file  Intimate Partner Violence: Not on file    Family History  Problem Relation Age of Onset   Diabetes Mother    Colon cancer Mother    Diabetes Father  No Known Allergies   Review of Systems   Review of Systems: Negative Unless Checked Constitutional: '[]'$ Weight loss  '[]'$ Fever  '[]'$ Chills Cardiac: '[]'$ Chest pain   '[]'$  Atrial Fibrillation  '[]'$ Palpitations   '[]'$ Shortness of breath when laying flat   '[]'$ Shortness of breath with exertion. '[]'$ Shortness of breath at rest Vascular:  '[x]'$ Pain in legs with walking   '[]'$ Pain in legs with standing '[]'$ Pain in legs when laying flat   '[x]'$ Claudication    '[]'$ Pain in feet when laying flat    '[]'$ History of DVT   '[]'$ Phlebitis   '[]'$ Swelling in legs   '[]'$ Varicose veins   '[]'$ Non-healing ulcers Pulmonary:   '[]'$ Uses home oxygen   '[]'$ Productive cough   '[]'$ Hemoptysis   '[]'$ Wheeze  '[]'$ COPD   '[]'$ Asthma Neurologic:  '[]'$ Dizziness   '[]'$ Seizures  '[]'$ Blackouts '[]'$ History of stroke   '[]'$ History of TIA  '[]'$ Aphasia   '[]'$ Temporary Blindness   '[]'$ Weakness or numbness in arm   '[]'$ Weakness or numbness in leg Musculoskeletal:   '[]'$ Joint swelling   '[]'$ Joint pain   '[]'$ Low back pain  '[]'$  History of Knee Replacement '[]'$ Arthritis '[]'$ back Surgeries  '[]'$  Spinal Stenosis    Hematologic:  '[]'$ Easy bruising  '[]'$ Easy bleeding   '[]'$ Hypercoagulable state   '[]'$ Anemic Gastrointestinal:  '[]'$ Diarrhea   '[]'$ Vomiting  '[]'$ Gastroesophageal reflux/heartburn   '[]'$ Difficulty  swallowing. '[]'$ Abdominal pain Genitourinary:  '[]'$ Chronic kidney disease   '[]'$ Difficult urination  '[]'$ Anuric   '[]'$ Blood in urine '[]'$ Frequent urination  '[]'$ Burning with urination   '[]'$ Hematuria Skin:  '[]'$ Rashes   '[]'$ Ulcers '[]'$ Wounds Psychological:  '[]'$ History of anxiety   '[]'$  History of major depression  '[]'$  Memory Difficulties      OBJECTIVE:   Physical Exam  BP (!) 140/74 (BP Location: Left Arm)   Pulse (!) 56   Resp 16   Wt 186 lb 9.6 oz (84.6 kg)   BMI 27.56 kg/m   Gen: WD/WN, NAD Head: [place]/AT, No temporalis wasting.  Ear/Nose/Throat: Hearing grossly intact, nares w/o erythema or drainage Eyes: PER, EOMI, sclera nonicteric.  Neck: Supple, no masses.  No JVD.  Pulmonary:  Good air movement, no use of accessory muscles.  Cardiac: RRR Vascular: No open ulcers/sore or wounds  Vessel Right Left  Dorsalis Pedis Not Palpable Not Palpable  Posterior Tibial Not Palpable Not Palpable   Gastrointestinal: soft, non-distended. No guarding/no peritoneal signs.  Musculoskeletal: M/S 5/5 throughout.  No deformity or atrophy.  Neurologic: Pain and light touch intact in extremities.  Symmetrical.  Speech is fluent. Motor exam as listed above. Psychiatric: Judgment intact, Mood & affect appropriate for pt's clinical situation. Dermatologic: No Venous rashes. No Ulcers Noted.  No changes consistent with cellulitis. Lymph : No Cervical lymphadenopathy, no lichenification or skin changes of chronic lymphedema.       ASSESSMENT AND PLAN:  1. Peripheral arterial disease with history of revascularization (HCC) Recommend:  The patient has a history of claudication symptoms and has lifestyle limiting claudication and denies any mild rest pain symptroms.  Given the severity of the patient's mild to moderate Right lower extremity symptoms the patient underwent angiography with the hope for intervention/revascularization.  Unfortunately on both 01/12/2022 and 01/26/2022 attempts were made to revascularize the limb  but were unsuccessful. Today we discussed in detail that revascularization of his right leg would require bypass surgery. In the event that the bypass either fails post operatively or is unsuccessful he would require amputation of his right lower limb.  Based on noninvasive studies today, the patient has marginal size vein for bypass.  The patient at this time feels  that his claudication symptoms are mild and he can live with them at this time. Due to the risks of possible amputation with bypass surgery he would rather proceed conservatively today.    We discussed in detail today he  should continue walking and begin a more formal exercise program. He should continue anticoagulation therapy and never miss a day. If his symptoms were to worsen or he develop open sores/ulcerations or wounds to his lower extremities he will call us immediately.   The patient will follow up with me in 3 months    2. Primary hypertension Continue antihypertensive medications as already ordered, these medications have been reviewed and there are no changes at this time.   3. Tobacco abuse Smoking cessation was discussed, 3-10 minutes spent on this topic specifically   4. Type II diabetes mellitus with complication (HCC) Continue hypoglycemic medications as already ordered, these medications have been reviewed and there are no changes at this time.  Hgb A1C to be monitored as already arranged by primary service    Current Outpatient Medications on File Prior to Visit  Medication Sig Dispense Refill   apixaban (ELIQUIS) 5 MG TABS tablet Take 1 tablet (5 mg total) by mouth 2 (two) times daily. 180 tablet 1   Artificial Tear Solution (SOOTHE XP) SOLN Place 1 drop into both eyes 3 (three) times daily as needed (dry eyes).     ferrous sulfate 325 (65 FE) MG tablet Take 325 mg by mouth 2 (two) times daily with a meal.     finasteride (PROSCAR) 5 MG tablet Take 5 mg by mouth daily.     glimepiride (AMARYL) 4 MG tablet Take  4 mg by mouth in the morning and at bedtime.     hydrochlorothiazide (HYDRODIURIL) 25 MG tablet Take 25 mg by mouth daily.     Misc Natural Products (URINOZINC PLUS PO) Take 1 tablet by mouth 2 (two) times daily.      telmisartan (MICARDIS) 80 MG tablet Take 80 mg by mouth daily.     No current facility-administered medications on file prior to visit.    There are no Patient Instructions on file for this visit. No follow-ups on file.   Kris Hartmann, NP  This note was completed with Sales executive.  Any errors are purely unintentional.

## 2022-03-03 ENCOUNTER — Encounter: Payer: Self-pay | Admitting: *Deleted

## 2022-03-03 ENCOUNTER — Telehealth: Payer: Self-pay | Admitting: *Deleted

## 2022-03-03 NOTE — Patient Outreach (Signed)
  Care Coordination   Initial Visit Note   03/03/2022 Name: Jonathon Barrera MRN: [id number] DOB: 04/03/1941  Jonathon Barrera is a 81 y.o. year old male who sees Sparks, Leonie Douglas, MD for primary care. I spoke with  Jonathon Barrera by phone today.  What matters to the patients health and wellness today?  History of atherosclerosis, had vascular procedure (lower extremity angiography) however verbalizes understanding that he is not a candidate for bypass.     Goals Addressed             This Visit's Progress    COMPLETED: Care Coordination Activities - No follow up needed       Care Coordination Interventions: Evaluation of current treatment plan related to Vascular disease and patient's adherence to plan as established by provider Advised patient to remain smoke free Reviewed medications with patient and discussed affordability of Eliquis Reviewed scheduled/upcoming provider appointments including follow up with vascular and PCP within the next 3 months Discussed plans with patient for ongoing care management follow up and provided patient with direct contact information for care management team Assessed social determinant of health barriers Reviewed blood pressure and blood sugar trends.  Blood pressure range 130s/70-80s.  Blood sugars less than 200, A1C 7.1         SDOH assessments and interventions completed:  Yes  SDOH Interventions Today    Flowsheet Row Most Recent Value  SDOH Interventions   Food Insecurity Interventions Intervention Not Indicated  Housing Interventions Intervention Not Indicated  Transportation Interventions Intervention Not Indicated  Utilities Interventions Intervention Not Indicated        Care Coordination Interventions Activated:  Yes  Care Coordination Interventions:  Yes, provided   Follow up plan: No further intervention required.   Encounter Outcome:  Pt. Visit Completed   Valente David, RN, MSN, [facility]  Management Coordinator [phone]

## 2022-04-01 DIAGNOSIS — G245 Blepharospasm: Secondary | ICD-10-CM | POA: Diagnosis not present

## 2022-05-22 ENCOUNTER — Encounter (INDEPENDENT_AMBULATORY_CARE_PROVIDER_SITE_OTHER): Payer: Self-pay

## 2022-05-22 ENCOUNTER — Encounter (INDEPENDENT_AMBULATORY_CARE_PROVIDER_SITE_OTHER): Payer: Self-pay | Admitting: Vascular Surgery

## 2022-05-22 ENCOUNTER — Ambulatory Visit (INDEPENDENT_AMBULATORY_CARE_PROVIDER_SITE_OTHER): Payer: PPO

## 2022-05-22 ENCOUNTER — Ambulatory Visit (INDEPENDENT_AMBULATORY_CARE_PROVIDER_SITE_OTHER): Payer: PPO | Admitting: Vascular Surgery

## 2022-05-22 VITALS — BP 131/79 | HR 91 | Ht 69.0 in | Wt 186.0 lb

## 2022-05-22 DIAGNOSIS — I10 Essential (primary) hypertension: Secondary | ICD-10-CM | POA: Diagnosis not present

## 2022-05-22 DIAGNOSIS — E118 Type 2 diabetes mellitus with unspecified complications: Secondary | ICD-10-CM | POA: Diagnosis not present

## 2022-05-22 DIAGNOSIS — I70211 Atherosclerosis of native arteries of extremities with intermittent claudication, right leg: Secondary | ICD-10-CM

## 2022-05-22 NOTE — Assessment & Plan Note (Signed)
blood glucose control important in reducing the progression of atherosclerotic disease. Also, involved in wound healing. On appropriate medications.  

## 2022-05-22 NOTE — Progress Notes (Signed)
MRN : [id number]  Jonathon Barrera is a 82 y.o. (1941/01/28) male who presents with chief complaint of  Chief Complaint  Patient presents with   Follow-up  .  History of Present Illness: Patient returns today in follow up of his PAD.  He has known right SFA occlusion that we have been unable to cross into with easily percutaneously.  He did not have limb threatening symptoms so we have held off on any surgical therapy in the form of bypass.  He continues to have moderate distance claudication but does bother him but it has become significantly worse.  He has not ulceration or infection.  No rest pain.  No fever or chills.  Current Outpatient Medications  Medication Sig Dispense Refill   apixaban (ELIQUIS) 5 MG TABS tablet Take 1 tablet (5 mg total) by mouth 2 (two) times daily. 180 tablet 1   Artificial Tear Solution (SOOTHE XP) SOLN Place 1 drop into both eyes 3 (three) times daily as needed (dry eyes).     ferrous sulfate 325 (65 FE) MG tablet Take 325 mg by mouth 2 (two) times daily with a meal.     finasteride (PROSCAR) 5 MG tablet Take 5 mg by mouth daily.     glimepiride (AMARYL) 4 MG tablet Take 4 mg by mouth in the morning and at bedtime.     hydrochlorothiazide (HYDRODIURIL) 25 MG tablet Take 25 mg by mouth daily.     Misc Natural Products (URINOZINC PLUS PO) Take 1 tablet by mouth 2 (two) times daily.      telmisartan (MICARDIS) 80 MG tablet Take 80 mg by mouth daily.     No current facility-administered medications for this visit.    Past Medical History:  Diagnosis Date   Actinic keratosis    Acute deep vein thrombosis (DVT) of distal vein of right lower extremity ([place]) 06/19/2020   Aortic atherosclerosis (HCC)    Bladder carcinoma ([place]) 05/26/2007   a.) papillary-transitional cell   BPH (benign prostatic hyperplasia)    Colon polyps    COPD (chronic obstructive pulmonary disease) (HCC)    Current use of long term anticoagulation    a.) Apixaban   Diverticulosis     DOE (dyspnea on exertion)    HOH (hard of hearing)    a.) uses BILATERAL assistive devices   Hyperlipemia    Hypertension    Mass    Lt lower cheek   Melanoma ([place]) 2000   Tx by Dr Koleen Nimrod   PAD (peripheral artery disease) [facility])    Peripheral vascular disease ([place])    Pulmonary embolism ([place]) 06/19/2020   a.) RLL; no associated RIGHT heart strain   RBBB (right bundle branch block)    Squamous cell carcinoma of skin 02/01/2017   Left dorsum latera. hand. EDC   Squamous cell carcinoma of skin 02/01/2017   Left dorsum base of thumb. EDC   T2DM (type 2 diabetes mellitus) ([place])     Past Surgical History:  Procedure Laterality Date   back skin cancer     BLADDER SURGERY     CARDIAC CATHETERIZATION     CATARACT EXTRACTION W/PHACO Right 04/30/2015   Procedure: CATARACT EXTRACTION PHACO AND INTRAOCULAR LENS PLACEMENT ([place]);  Surgeon: Birder Robson, MD;  Location: [facility] ORS;  Service: Ophthalmology;  Laterality: Right;  [place] 01:03 AP% 21.4 CDE  13.57 fluid pack lot # [id number]   CATARACT EXTRACTION W/PHACO Left 05/21/2015   Procedure: CATARACT EXTRACTION PHACO AND INTRAOCULAR LENS PLACEMENT (IOC);  Surgeon: Birder Robson, MD;  Location: [facility];  Service: Ophthalmology;  Laterality: Left;  [place] 00:52 AP% 18.9 CDE 9.95 fluid pack lot #[id number]   COLONOSCOPY WITH PROPOFOL N/A 11/05/2014   Procedure: COLONOSCOPY WITH PROPOFOL;  Surgeon: Hulen Luster, MD;  Location: [facility] ENDOSCOPY;  Service: Gastroenterology;  Laterality: N/A;   COLONOSCOPY WITH PROPOFOL N/A 05/19/2021   Procedure: COLONOSCOPY WITH PROPOFOL;  Surgeon: Annamaria Helling, DO;  Location: [facility] ENDOSCOPY;  Service: Gastroenterology;  Laterality: N/A;  DM Patient is hard of hearing and would like for wife to answer questions, please.   ENDARTERECTOMY FEMORAL Right 02/26/2021   Procedure: ENDARTERECTOMY FEMORAL (SFA STENT PLACEMEN);  Surgeon: Algernon Huxley, MD;  Location: [facility] ORS;  Service: Vascular;  Laterality: Right;    ESOPHAGOGASTRODUODENOSCOPY (EGD) WITH PROPOFOL N/A 05/19/2021   Procedure: ESOPHAGOGASTRODUODENOSCOPY (EGD) WITH PROPOFOL;  Surgeon: Annamaria Helling, DO;  Location: [place];  Service: Gastroenterology;  Laterality: N/A;   HERNIA REPAIR     inguinal   KYPHOPLASTY N/A 02/23/2019   Procedure: T12 KYPHOPLASTY;  Surgeon: Hessie Knows, MD;  Location: [facility] ORS;  Service: Orthopedics;  Laterality: N/A;   LESION EXCISION N/A 02/23/2018   Procedure: EXCISION SCALP CYST AND FACIAL CYST;  Surgeon: Herbert Pun, MD;  Location: [facility] ORS;  Service: General;  Laterality: N/A;   LOWER EXTREMITY ANGIOGRAPHY Right 01/23/2021   Procedure: LOWER EXTREMITY ANGIOGRAPHY;  Surgeon: Algernon Huxley, MD;  Location: [place] CV LAB;  Service: Cardiovascular;  Laterality: Right;   LOWER EXTREMITY ANGIOGRAPHY Right 01/12/2022   Procedure: Lower Extremity Angiography;  Surgeon: Algernon Huxley, MD;  Location: [place] CV LAB;  Service: Cardiovascular;  Laterality: Right;   LOWER EXTREMITY ANGIOGRAPHY Right 01/26/2022   Procedure: Lower Extremity Angiography;  Surgeon: Algernon Huxley, MD;  Location: [place] [facility] LAB;  Service: Cardiovascular;  Laterality: Right;   SHOULDER ARTHROSCOPY W/ ROTATOR CUFF REPAIR Right    TUR-BT       Social History   Tobacco Use   Smoking status: Every Day    Packs/day: 0.50    Types: Cigarettes   Smokeless tobacco: Never  Vaping Use   Vaping Use: Never used  Substance Use Topics   Alcohol use: Yes    Alcohol/week: 6.0 standard drinks of alcohol    Types: 6 Cans of beer per week   Drug use: No       Family History  Problem Relation Age of Onset   Diabetes Mother    Colon cancer Mother    Diabetes Father      No Known Allergies   REVIEW OF SYSTEMS (Negative unless checked)  Constitutional: '[]'$ Weight loss  '[]'$ Fever  '[]'$ Chills Cardiac: '[]'$ Chest pain   '[]'$ Chest pressure   '[]'$ Palpitations   '[]'$ Shortness of breath when laying flat   '[]'$ Shortness of breath at  rest   '[]'$ Shortness of breath with exertion. Vascular:  '[x]'$ Pain in legs with walking   '[]'$ Pain in legs at rest   '[]'$ Pain in legs when laying flat   '[x]'$ Claudication   '[]'$ Pain in feet when walking  '[]'$ Pain in feet at rest  '[]'$ Pain in feet when laying flat   '[]'$ History of DVT   '[]'$ Phlebitis   '[]'$ Swelling in legs   '[]'$ Varicose veins   '[]'$ Non-healing ulcers Pulmonary:   '[]'$ Uses home oxygen   '[]'$ Productive cough   '[]'$ Hemoptysis   '[]'$ Wheeze  '[]'$ COPD   '[]'$ Asthma Neurologic:  '[]'$ Dizziness  '[]'$ Blackouts   '[]'$ Seizures   '[]'$ History of stroke   '[]'$ History of TIA  '[]'$ Aphasia   '[]'$   Temporary blindness   '[]'$ Dysphagia   '[]'$ Weakness or numbness in arms   '[]'$ Weakness or numbness in legs Musculoskeletal:  '[x]'$ Arthritis   '[]'$ Joint swelling   '[]'$ Joint pain   '[]'$ Low back pain Hematologic:  '[]'$ Easy bruising  '[]'$ Easy bleeding   '[]'$ Hypercoagulable state   '[]'$ Anemic   Gastrointestinal:  '[]'$ Blood in stool   '[]'$ Vomiting blood  '[]'$ Gastroesophageal reflux/heartburn   '[]'$ Abdominal pain Genitourinary:  '[]'$ Chronic kidney disease   '[]'$ Difficult urination  '[]'$ Frequent urination  '[]'$ Burning with urination   '[]'$ Hematuria Skin:  '[]'$ Rashes   '[]'$ Ulcers   '[]'$ Wounds Psychological:  '[]'$ History of anxiety   '[]'$  History of major depression.  Physical Examination  BP 131/79   Pulse 91   Ht '5\' 9"'$  (1.753 m)   Wt 186 lb (84.4 kg)   BMI 27.47 kg/m  Gen:  WD/WN, NAD. Appears younger than stated age. Head: [place]/AT, No temporalis wasting. Ear/Nose/Throat: Hearing grossly intact, nares w/o erythema or drainage Eyes: Conjunctiva clear. Sclera non-icteric Neck: Supple.  Trachea midline Pulmonary:  Good air movement, no use of accessory muscles.  Cardiac: RRR, no JVD Vascular:  Vessel Right Left  Radial Palpable Palpable                          PT 1+ Palpable 1+ Palpable  DP Not Palpable 1+ Palpable   Gastrointestinal: soft, non-tender/non-distended. No guarding/reflex.  Musculoskeletal: M/S 5/5 throughout.  No deformity or atrophy. No edema. Neurologic: Sensation grossly intact in  extremities.  Symmetrical.  Speech is fluent.  Psychiatric: Judgment intact, Mood & affect appropriate for pt's clinical situation. Dermatologic: No rashes or ulcers noted.  No cellulitis or open wounds.      Labs No results found for this or any previous visit (from the past 2160 hour(s)).  Radiology No results found.  Assessment/Plan  Atherosclerosis of native arteries of extremity with intermittent claudication ([place]) Patient continues to have moderate to severe claudication symptoms but these are stable and he does not have limb threatening symptoms.  We discussed that any further attempted revascularization would likely be with surgical bypass.  At this time, we had a long discussion he is agreeable to continue conservative management with continued exercise lifestyle modifications.  Continue current medical regimen.  Recheck in 3 to 4 months with ABIs.  Hypertension blood pressure control important in reducing the progression of atherosclerotic disease. On appropriate oral medications.   Type II diabetes mellitus with complication (HCC) blood glucose control important in reducing the progression of atherosclerotic disease. Also, involved in wound healing. On appropriate medications.    [name], MD  05/22/2022 10:20 AM    This note was created with Dragon medical transcription system.  Any errors from dictation are purely unintentional

## 2022-05-22 NOTE — Assessment & Plan Note (Signed)
Patient continues to have moderate to severe claudication symptoms but these are stable and he does not have limb threatening symptoms.  We discussed that any further attempted revascularization would likely be with surgical bypass.  At this time, we had a long discussion he is agreeable to continue conservative management with continued exercise lifestyle modifications.  Continue current medical regimen.  Recheck in 3 to 4 months with ABIs.

## 2022-05-22 NOTE — Assessment & Plan Note (Signed)
blood pressure control important in reducing the progression of atherosclerotic disease. On appropriate oral medications.  

## 2022-06-02 DIAGNOSIS — Z0000 Encounter for general adult medical examination without abnormal findings: Secondary | ICD-10-CM | POA: Diagnosis not present

## 2022-06-02 DIAGNOSIS — I10 Essential (primary) hypertension: Secondary | ICD-10-CM | POA: Diagnosis not present

## 2022-06-02 DIAGNOSIS — J431 Panlobular emphysema: Secondary | ICD-10-CM | POA: Diagnosis not present

## 2022-06-02 DIAGNOSIS — I2699 Other pulmonary embolism without acute cor pulmonale: Secondary | ICD-10-CM | POA: Diagnosis not present

## 2022-06-02 DIAGNOSIS — Z79899 Other long term (current) drug therapy: Secondary | ICD-10-CM | POA: Diagnosis not present

## 2022-06-02 DIAGNOSIS — E118 Type 2 diabetes mellitus with unspecified complications: Secondary | ICD-10-CM | POA: Diagnosis not present

## 2022-06-02 DIAGNOSIS — E782 Mixed hyperlipidemia: Secondary | ICD-10-CM | POA: Diagnosis not present

## 2022-07-27 ENCOUNTER — Other Ambulatory Visit: Payer: Self-pay | Admitting: Nurse Practitioner

## 2022-08-05 DIAGNOSIS — G245 Blepharospasm: Secondary | ICD-10-CM | POA: Diagnosis not present

## 2022-08-11 ENCOUNTER — Inpatient Hospital Stay: Payer: PPO | Attending: Internal Medicine

## 2022-08-11 DIAGNOSIS — D696 Thrombocytopenia, unspecified: Secondary | ICD-10-CM | POA: Diagnosis not present

## 2022-08-11 DIAGNOSIS — Z7901 Long term (current) use of anticoagulants: Secondary | ICD-10-CM | POA: Insufficient documentation

## 2022-08-11 DIAGNOSIS — D649 Anemia, unspecified: Secondary | ICD-10-CM | POA: Insufficient documentation

## 2022-08-11 DIAGNOSIS — Z86718 Personal history of other venous thrombosis and embolism: Secondary | ICD-10-CM | POA: Insufficient documentation

## 2022-08-11 LAB — BASIC METABOLIC PANEL
Anion gap: 7 (ref 5–15)
BUN: 24 mg/dL — ABNORMAL HIGH (ref 8–23)
CO2: 25 mmol/L (ref 22–32)
Calcium: 9.1 mg/dL (ref 8.9–10.3)
Chloride: 104 mmol/L (ref 98–111)
Creatinine, Ser: 0.83 mg/dL (ref 0.61–1.24)
GFR, Estimated: >60 mL/min (ref >60)
Glucose, Bld: 168 mg/dL — ABNORMAL HIGH (ref 70–99)
Potassium: 3.5 mmol/L (ref 3.5–5.1)
Sodium: 136 mmol/L (ref 135–145)

## 2022-08-11 LAB — IRON AND TIBC
Iron: 101 ug/dL (ref 45–182)
Saturation Ratios: 30 % (ref 17.9–39.5)
TIBC: 340 ug/dL (ref 250–450)
UIBC: 239 ug/dL

## 2022-08-11 LAB — CBC WITH DIFFERENTIAL/PLATELET
Abs Immature Granulocytes: 0.03 10*3/uL (ref 0.00–0.07)
Basophils Absolute: 0.1 10*3/uL (ref 0.0–0.1)
Basophils Relative: 2 %
Eosinophils Absolute: 0.1 10*3/uL (ref 0.0–0.5)
Eosinophils Relative: 4 %
HCT: 34.6 % — ABNORMAL LOW (ref 39.0–52.0)
Hemoglobin: 11.5 g/dL — ABNORMAL LOW (ref 13.0–17.0)
Immature Granulocytes: 1 %
Lymphocytes Relative: 37 %
Lymphs Abs: 1.3 10*3/uL (ref 0.7–4.0)
MCH: 32.4 pg (ref 26.0–34.0)
MCHC: 33.2 g/dL (ref 30.0–36.0)
MCV: 97.5 fL (ref 80.0–100.0)
Monocytes Absolute: 0.3 10*3/uL (ref 0.1–1.0)
Monocytes Relative: 9 %
Neutro Abs: 1.6 10*3/uL — ABNORMAL LOW (ref 1.7–7.7)
Neutrophils Relative %: 47 %
Platelets: 150 10*3/uL (ref 150–400)
RBC: 3.55 MIL/uL — ABNORMAL LOW (ref 4.22–5.81)
RDW: 14.1 % (ref 11.5–15.5)
WBC: 3.5 10*3/uL — ABNORMAL LOW (ref 4.0–10.5)
nRBC: 0 % (ref 0.0–0.2)

## 2022-08-11 LAB — FERRITIN: Ferritin: 61 ng/mL (ref 24–336)

## 2022-08-12 LAB — KAPPA/LAMBDA LIGHT CHAINS
Kappa free light chain: 239.9 mg/L — ABNORMAL HIGH (ref 3.3–19.4)
Kappa, lambda light chain ratio: 26.08 — ABNORMAL HIGH (ref 0.26–1.65)
Lambda free light chains: 9.2 mg/L (ref 5.7–26.3)

## 2022-08-16 LAB — MULTIPLE MYELOMA PANEL, SERUM
Albumin SerPl Elph-Mcnc: 3.4 g/dL (ref 2.9–4.4)
Albumin/Glob SerPl: 1 (ref 0.7–1.7)
Alpha 1: 0.1 g/dL (ref 0.0–0.4)
Alpha2 Glob SerPl Elph-Mcnc: 0.8 g/dL (ref 0.4–1.0)
B-Globulin SerPl Elph-Mcnc: 0.8 g/dL (ref 0.7–1.3)
Gamma Glob SerPl Elph-Mcnc: 1.9 g/dL — ABNORMAL HIGH (ref 0.4–1.8)
Globulin, Total: 3.7 g/dL (ref 2.2–3.9)
IgA: 81 mg/dL (ref 61–437)
IgG (Immunoglobin G), Serum: 2058 mg/dL — ABNORMAL HIGH (ref 603–1613)
IgM (Immunoglobulin M), Srm: 156 mg/dL — ABNORMAL HIGH (ref 15–143)
M Protein SerPl Elph-Mcnc: 1.3 g/dL — ABNORMAL HIGH
Total Protein ELP: 7.1 g/dL (ref 6.0–8.5)

## 2022-08-24 MED FILL — Iron Sucrose Inj 20 MG/ML (Fe Equiv): INTRAVENOUS | Qty: 10 | Status: AC

## 2022-08-25 ENCOUNTER — Inpatient Hospital Stay: Payer: PPO | Attending: Internal Medicine | Admitting: Nurse Practitioner

## 2022-08-25 ENCOUNTER — Inpatient Hospital Stay: Payer: PPO

## 2022-08-25 ENCOUNTER — Encounter: Payer: Self-pay | Admitting: Nurse Practitioner

## 2022-08-25 VITALS — BP 122/65 | HR 75 | Temp 97.8°F

## 2022-08-25 DIAGNOSIS — Z86718 Personal history of other venous thrombosis and embolism: Secondary | ICD-10-CM | POA: Diagnosis not present

## 2022-08-25 DIAGNOSIS — Z86711 Personal history of pulmonary embolism: Secondary | ICD-10-CM | POA: Insufficient documentation

## 2022-08-25 DIAGNOSIS — D509 Iron deficiency anemia, unspecified: Secondary | ICD-10-CM | POA: Diagnosis not present

## 2022-08-25 DIAGNOSIS — I739 Peripheral vascular disease, unspecified: Secondary | ICD-10-CM | POA: Insufficient documentation

## 2022-08-25 DIAGNOSIS — D472 Monoclonal gammopathy: Secondary | ICD-10-CM | POA: Diagnosis not present

## 2022-08-25 DIAGNOSIS — D500 Iron deficiency anemia secondary to blood loss (chronic): Secondary | ICD-10-CM

## 2022-08-25 DIAGNOSIS — Z79899 Other long term (current) drug therapy: Secondary | ICD-10-CM | POA: Diagnosis not present

## 2022-08-25 DIAGNOSIS — F17210 Nicotine dependence, cigarettes, uncomplicated: Secondary | ICD-10-CM | POA: Insufficient documentation

## 2022-08-25 DIAGNOSIS — I824Z1 Acute embolism and thrombosis of unspecified deep veins of right distal lower extremity: Secondary | ICD-10-CM

## 2022-08-25 DIAGNOSIS — D696 Thrombocytopenia, unspecified: Secondary | ICD-10-CM | POA: Insufficient documentation

## 2022-08-25 DIAGNOSIS — Z7984 Long term (current) use of oral hypoglycemic drugs: Secondary | ICD-10-CM | POA: Insufficient documentation

## 2022-08-25 DIAGNOSIS — Z7901 Long term (current) use of anticoagulants: Secondary | ICD-10-CM | POA: Diagnosis not present

## 2022-08-25 NOTE — Progress Notes (Signed)
[facility] CONSULT NOTE  Patient Care Team: Marguarite Arbour, MD as PCP - General (Internal Medicine) Earna Coder, MD as Consulting Physician (Oncology) Kemper Durie, RN as [facility] Care Management  CHIEF COMPLAINTS/PURPOSE OF CONSULTATION: ANEMIA  HEMATOLOGY HISTORY:  # ANEMIA NORMOCYTIC  Jonathon Barrera 2023- PCP-hemoglobin 8.7; saturation 7%; ferritin 23-LL-28]EGD/ colonoscopy-JAN 2023 [[facility]]; NO CT-A/P or capsule.   # MARCH 2023-possible MGUS; IgG kappa 1.6 g; kappa/lambda light chain ratio 33  March 2022-PE small volume right lower lobe; and right lower extremity above-knee DVT.  On Eliquis; OFF in March 2023; JUNE 15th-DVT-femoral/popliteal nonocclusive-started on Eliquis- INDEFINITE   # Hx of PVD [Dr.Dew]   Latest Reference Range & Units Most Recent 06/11/21 12:25 09/19/21 10:53  Kappa free light chain 3.3 - 19.4 mg/L 246.9 (H) 09/19/21 10:53 351.3 (H) 246.9 (H)  Lambda free light chains 5.7 - 26.3 mg/L 8.9 09/19/21 10:53 10.5 8.9  Kappa, lambda light chain ratio 0.26 - 1.65  27.74 (H) 09/19/21 10:53 33.46 (H) 27.74 (H)  (H): Data is abnormally high   HISTORY OF PRESENTING ILLNESS: Patient accompanied by his wife.  Ambulating independently.  Slightly hard of hearing.  Jonathon Barrera 82 y.o. male with iron deficient anemia of unclear etiology, history of recurrent DVT/PE, on eliquis, and MGUS who returns to clinic for consideration of IV iron. He continues oral iron. Energy has improved. Denies black or bloody stools. Weight is stable.    Review of Systems  Constitutional:  Positive for malaise/fatigue. Negative for chills, diaphoresis, fever and weight loss.  HENT:  Negative for nosebleeds and sore throat.   Eyes:  Negative for double vision.  Respiratory:  Negative for cough, hemoptysis, sputum production, shortness of breath and wheezing.   Cardiovascular:  Negative for chest pain, palpitations, orthopnea and leg swelling.   Gastrointestinal:  Negative for abdominal pain, blood in stool, constipation, diarrhea, heartburn, melena, nausea and vomiting.  Genitourinary:  Negative for dysuria, frequency and urgency.  Musculoskeletal:  Positive for joint pain. Negative for back pain.  Skin: Negative.  Negative for itching and rash.  Neurological:  Negative for dizziness, tingling, focal weakness, weakness and headaches.  Endo/Heme/Allergies:  Does not bruise/bleed easily.  Psychiatric/Behavioral:  Negative for depression. The patient is not nervous/anxious and does not have insomnia.     MEDICAL HISTORY:  Past Medical History:  Diagnosis Date   Actinic keratosis    Acute deep vein thrombosis (DVT) of distal vein of right lower extremity (HCC) 06/19/2020   Aortic atherosclerosis (HCC)    Bladder carcinoma (HCC) 05/26/2007   a.) papillary-transitional cell   BPH (benign prostatic hyperplasia)    Colon polyps    COPD (chronic obstructive pulmonary disease) (HCC)    Current use of long term anticoagulation    a.) Apixaban   Diverticulosis    DOE (dyspnea on exertion)    HOH (hard of hearing)    a.) uses BILATERAL assistive devices   Hyperlipemia    Hypertension    Mass    Lt lower cheek   Melanoma (HCC) 2000   Tx by Dr Orson Aloe   PAD (peripheral artery disease) [facility])    Peripheral vascular disease (HCC)    Pulmonary embolism (HCC) 06/19/2020   a.) RLL; no associated RIGHT heart strain   RBBB (right bundle branch block)    Squamous cell carcinoma of skin 02/01/2017   Left dorsum latera. hand. EDC   Squamous cell carcinoma of skin 02/01/2017   Left dorsum base of thumb.  EDC   T2DM (type 2 diabetes mellitus) (HCC)     SURGICAL HISTORY: Past Surgical History:  Procedure Laterality Date   back skin cancer     BLADDER SURGERY     CARDIAC CATHETERIZATION     CATARACT EXTRACTION W/PHACO Right 04/30/2015   Procedure: CATARACT EXTRACTION PHACO AND INTRAOCULAR LENS PLACEMENT (IOC);  Surgeon: Galen Manila, MD;  Location: [facility] ORS;  Service: Ophthalmology;  Laterality: Right;  [place] 01:03 AP% 21.4 CDE  13.57 fluid pack lot # [id number]   CATARACT EXTRACTION W/PHACO Left 05/21/2015   Procedure: CATARACT EXTRACTION PHACO AND INTRAOCULAR LENS PLACEMENT (IOC);  Surgeon: Galen Manila, MD;  Location: [facility] ORS;  Service: Ophthalmology;  Laterality: Left;  [place] 00:52 AP% 18.9 CDE 9.95 fluid pack lot #[id number]   COLONOSCOPY WITH PROPOFOL N/A 11/05/2014   Procedure: COLONOSCOPY WITH PROPOFOL;  Surgeon: Wallace Cullens, MD;  Location: [facility] ENDOSCOPY;  Service: Gastroenterology;  Laterality: N/A;   COLONOSCOPY WITH PROPOFOL N/A 05/19/2021   Procedure: COLONOSCOPY WITH PROPOFOL;  Surgeon: Jaynie Collins, DO;  Location: [facility] ENDOSCOPY;  Service: Gastroenterology;  Laterality: N/A;  DM Patient is hard of hearing and would like for wife to answer questions, please.   ENDARTERECTOMY FEMORAL Right 02/26/2021   Procedure: ENDARTERECTOMY FEMORAL (SFA STENT PLACEMEN);  Surgeon: Annice Needy, MD;  Location: [facility] ORS;  Service: Vascular;  Laterality: Right;   ESOPHAGOGASTRODUODENOSCOPY (EGD) WITH PROPOFOL N/A 05/19/2021   Procedure: ESOPHAGOGASTRODUODENOSCOPY (EGD) WITH PROPOFOL;  Surgeon: Jaynie Collins, DO;  Location: [facility] ENDOSCOPY;  Service: Gastroenterology;  Laterality: N/A;   HERNIA REPAIR     inguinal   KYPHOPLASTY N/A 02/23/2019   Procedure: T12 KYPHOPLASTY;  Surgeon: Kennedy Bucker, MD;  Location: [facility] ORS;  Service: Orthopedics;  Laterality: N/A;   LESION EXCISION N/A 02/23/2018   Procedure: EXCISION SCALP CYST AND FACIAL CYST;  Surgeon: Carolan Shiver, MD;  Location: [facility] ORS;  Service: General;  Laterality: N/A;   LOWER EXTREMITY ANGIOGRAPHY Right 01/23/2021   Procedure: LOWER EXTREMITY ANGIOGRAPHY;  Surgeon: Annice Needy, MD;  Location: [facility] INVASIVE CV LAB;  Service: Cardiovascular;  Laterality: Right;   LOWER EXTREMITY ANGIOGRAPHY Right 01/12/2022   Procedure: Lower Extremity Angiography;   Surgeon: Annice Needy, MD;  Location: [facility] INVASIVE CV LAB;  Service: Cardiovascular;  Laterality: Right;   LOWER EXTREMITY ANGIOGRAPHY Right 01/26/2022   Procedure: Lower Extremity Angiography;  Surgeon: Annice Needy, MD;  Location: [facility] INVASIVE CV LAB;  Service: Cardiovascular;  Laterality: Right;   SHOULDER ARTHROSCOPY W/ ROTATOR CUFF REPAIR Right    TUR-BT      SOCIAL HISTORY: Social History   Socioeconomic History   Marital status: Married    Spouse name: Not on file   Number of children: Not on file   Years of education: Not on file   Highest education level: Not on file  Occupational History   Not on file  Tobacco Use   Smoking status: Every Day    Packs/day: .5    Types: Cigarettes   Smokeless tobacco: Never  Vaping Use   Vaping Use: Never used  Substance and Sexual Activity   Alcohol use: Yes    Alcohol/week: 6.0 standard drinks of alcohol    Types: 6 Cans of beer per week   Drug use: No   Sexual activity: Not on file  Other Topics Concern   Not on file  Social History Narrative   Lives in [place]; with wife. 2 children/near by. Smokes 1/2 ppd; 3-4 beers/weekend.  Retired in 2010; Patent examiner.    Social Determinants of Health   Financial Resource Strain: Not on file  Food Insecurity: No Food Insecurity (03/03/2022)   Hunger Vital Sign    Worried About Running Out of Food in the Last Year: Never true    Ran Out of Food in the Last Year: Never true  Transportation Needs: No Transportation Needs (03/03/2022)   PRAPARE - Administrator, Civil Service (Medical): No    Lack of Transportation (Non-Medical): No  Physical Activity: Not on file  Stress: Not on file  Social Connections: Not on file  Intimate Partner Violence: Not on file    FAMILY HISTORY: Family History  Problem Relation Age of Onset   Diabetes Mother    Colon cancer Mother    Diabetes Father     ALLERGIES:  has No Known Allergies.  MEDICATIONS:   Current Outpatient Medications  Medication Sig Dispense Refill   apixaban (ELIQUIS) 5 MG TABS tablet Take 1 tablet (5 mg total) by mouth 2 (two) times daily. 180 tablet 1   Artificial Tear Solution (SOOTHE XP) SOLN Place 1 drop into both eyes 3 (three) times daily as needed (dry eyes).     ferrous sulfate 325 (65 FE) MG tablet Take 325 mg by mouth 2 (two) times daily with a meal.     finasteride (PROSCAR) 5 MG tablet Take 5 mg by mouth daily.     glimepiride (AMARYL) 4 MG tablet Take 4 mg by mouth in the morning and at bedtime.     hydrochlorothiazide (HYDRODIURIL) 25 MG tablet Take 25 mg by mouth daily.     Misc Natural Products (URINOZINC PLUS PO) Take 1 tablet by mouth 2 (two) times daily.      pioglitazone (ACTOS) 30 MG tablet Take by mouth.     telmisartan (MICARDIS) 80 MG tablet Take 80 mg by mouth daily.     No current facility-administered medications for this visit.    PHYSICAL EXAMINATION: Vitals:   08/25/22 1304  BP: 122/65  Pulse: 75  Temp: 97.8 F (36.6 C)  SpO2: 98%   There were no vitals filed for this visit.  Physical Exam Vitals reviewed.  Constitutional:      Appearance: He is not ill-appearing.  HENT:     Head: Normocephalic and atraumatic.  Cardiovascular:     Rate and Rhythm: Normal rate and regular rhythm.  Pulmonary:     Comments: Decreased breath sounds bilaterally.  Abdominal:     General: There is no distension.     Palpations: Abdomen is soft.     Tenderness: There is no abdominal tenderness.  Skin:    General: Skin is warm and dry.     Coloration: Skin is not pale.  Neurological:     Mental Status: He is alert and oriented to person, place, and time. Mental status is at baseline.  Psychiatric:        Mood and Affect: Mood normal.        Behavior: Behavior normal.     LABORATORY DATA:  I have reviewed the data as listed Lab Results  Component Value Date   WBC 3.5 (L) 08/11/2022   HGB 11.5 (L) 08/11/2022   HCT 34.6 (L) 08/11/2022    MCV 97.5 08/11/2022   PLT 150 08/11/2022   Recent Labs    09/19/21 1053 01/12/22 1134 01/26/22 0820 01/29/22 0755 08/11/22 0755  NA 139  --   --  136 136  K 4.0  --   --  3.8 3.5  CL 104  --   --  104 104  CO2 29  --   --  27 25  GLUCOSE 127*  --   --  157* 168*  BUN 23   < > 28* 28* 24*  CREATININE 1.06   < > 0.89 0.91 0.83  CALCIUM 9.0  --   --  9.2 9.1  GFRNONAA >60   < > >60 >60 >60   < > = values in this interval not displayed.   Iron/TIBC/Ferritin/ %Sat    Component Value Date/Time   IRON 101 08/11/2022 0755   TIBC 340 08/11/2022 0755   FERRITIN 61 08/11/2022 0755   IRONPCTSAT 30 08/11/2022 0755    No results found.  Assessment & Plan:  No problem-specific Assessment & Plan notes found for this encounter.  Symptomatic anemia # Moderate to severe anemia/likely iron deficiency- hemoglobin 8; MCV normal; platelets normal [FEB 2023; PCP]-ferritin-16 [LN-24; iron saturation 7%]; normal white count/platelets.  S/p IV Venofer x 4, last 07/04/21. Hemoglobin 11.5 today. Slightly decreased from previous 13. Ferritin normal at 61, iron sat 30%. Stable. Hold IV Venofer. Continue gentle iron daily.     # MGUS:  ELEVATED M protein: MARCH 2023- IgG- 1.6 /dl; K/L= 33 [anemia-see above; no hypercalcemia no renal insufficiency; ? Bone lesions but no imaging performed]. April 2024- M protein 1.4; K/L Ratio 26. Stable. Hold bone marrow biopsy. Patient and wife in agreement.   # March 2022 [Dr.Sparks]-small volume PE/ RIGHT LE DVT- Positive for deep venous thrombosis in the right lower extremity [Above Knee- fem]; August 2022 repeat ultrasound-improved not resolved. OFF Eliquis [since March].  October 02, 2021 DVT-femoral/popliteal- on Eliquis 5 mg BID.    Given recurrent DVT including prior history of PE, long term anticoagulation recommended or risk vs benefit discussion. No falls or apparent blood loss currently. Tolerating well. Continue eliquis.     # Intermittent thrombocytopenia - mild,  intermittent. 150 today. Monitor while on eliquis.     # Hx o PVD [s/p stenting]- on eliquis- [Dr.Dew]   DISPOSITION: HOLD venofer 6 mo- lab (cbc, bmp, spep, kllc, ferritin, iron studies), Dr Donneta Romberg, +/- venofer- la  All questions were answered. The patient knows to call the clinic with any problems, questions or concerns.   Alinda Dooms, NP 08/25/2022

## 2022-09-16 ENCOUNTER — Encounter: Payer: Self-pay | Admitting: Internal Medicine

## 2022-10-01 DIAGNOSIS — E782 Mixed hyperlipidemia: Secondary | ICD-10-CM | POA: Diagnosis not present

## 2022-10-01 DIAGNOSIS — E118 Type 2 diabetes mellitus with unspecified complications: Secondary | ICD-10-CM | POA: Diagnosis not present

## 2022-10-01 DIAGNOSIS — D500 Iron deficiency anemia secondary to blood loss (chronic): Secondary | ICD-10-CM | POA: Diagnosis not present

## 2022-10-01 DIAGNOSIS — J431 Panlobular emphysema: Secondary | ICD-10-CM | POA: Diagnosis not present

## 2022-10-01 DIAGNOSIS — J439 Emphysema, unspecified: Secondary | ICD-10-CM | POA: Diagnosis not present

## 2022-10-01 DIAGNOSIS — Z125 Encounter for screening for malignant neoplasm of prostate: Secondary | ICD-10-CM | POA: Diagnosis not present

## 2022-10-01 DIAGNOSIS — J449 Chronic obstructive pulmonary disease, unspecified: Secondary | ICD-10-CM | POA: Diagnosis not present

## 2022-10-01 DIAGNOSIS — I10 Essential (primary) hypertension: Secondary | ICD-10-CM | POA: Diagnosis not present

## 2022-10-01 DIAGNOSIS — Z79899 Other long term (current) drug therapy: Secondary | ICD-10-CM | POA: Diagnosis not present

## 2022-10-03 IMAGING — US US EXTREM LOW VENOUS*R*
1 series · 13 of 24 positions shown · non-contrast
Comparison: None.

CLINICAL DATA: 79-year-old with right lower extremity swelling for
3 days.



[Series 1: us venous img lower uni right (dvt) · portal-venous · 13 of 33 slices shown]
[im 1/33]
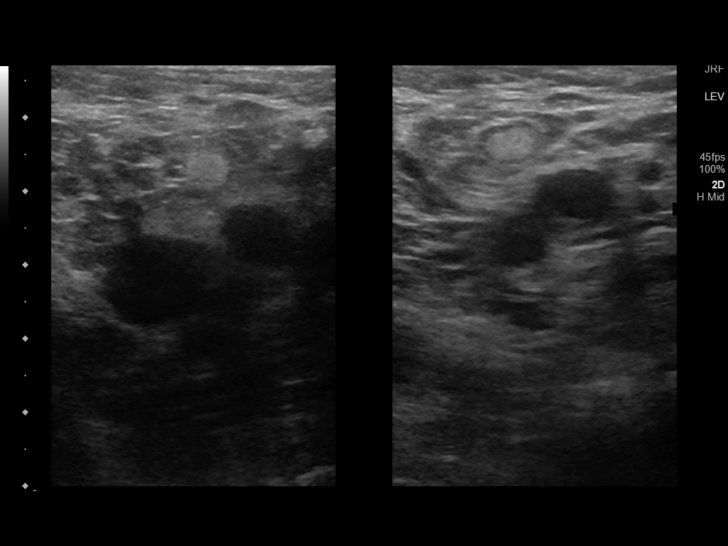
[im 3/33]
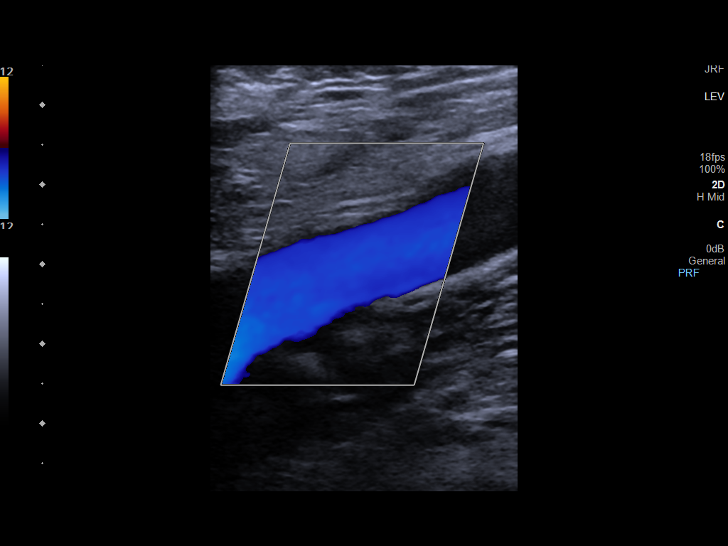
[im 6/33]
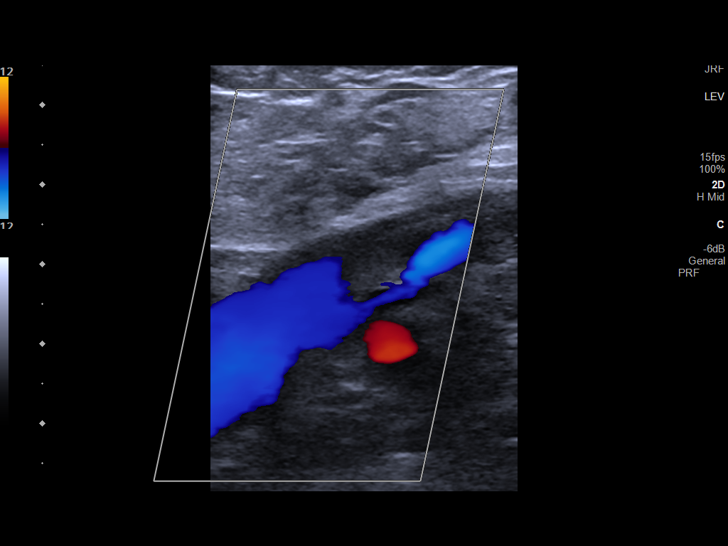
[im 9/33]
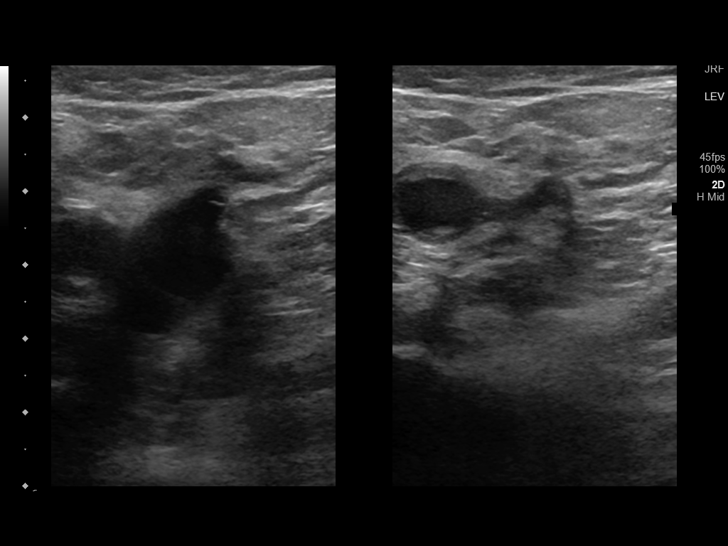
[im 12/33]
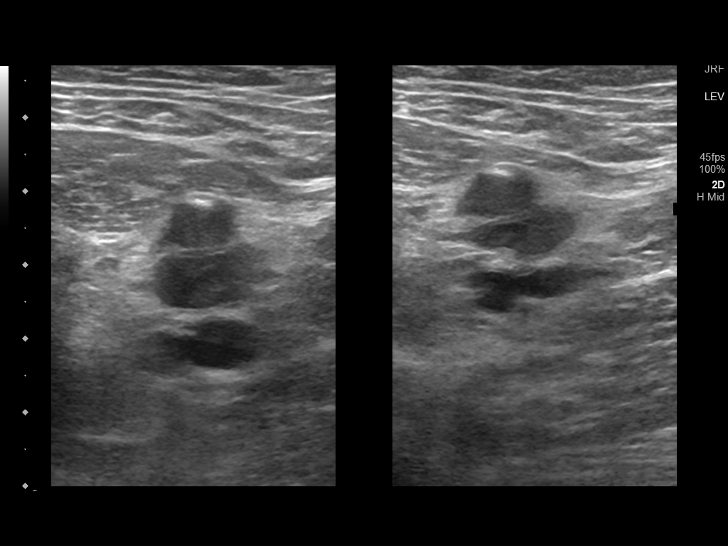
[im 14/33]
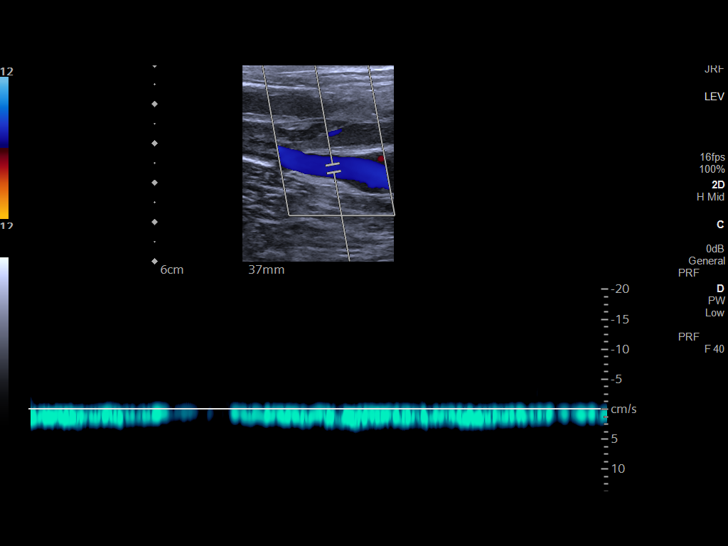
[im 17/33]
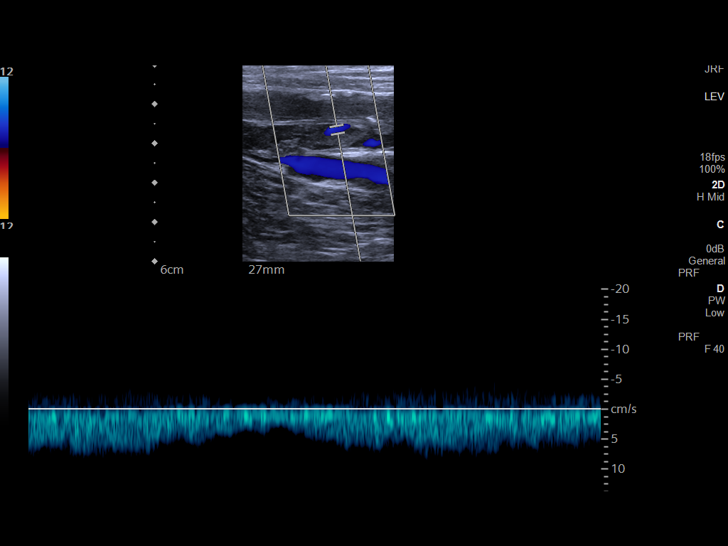
[im 19/33]
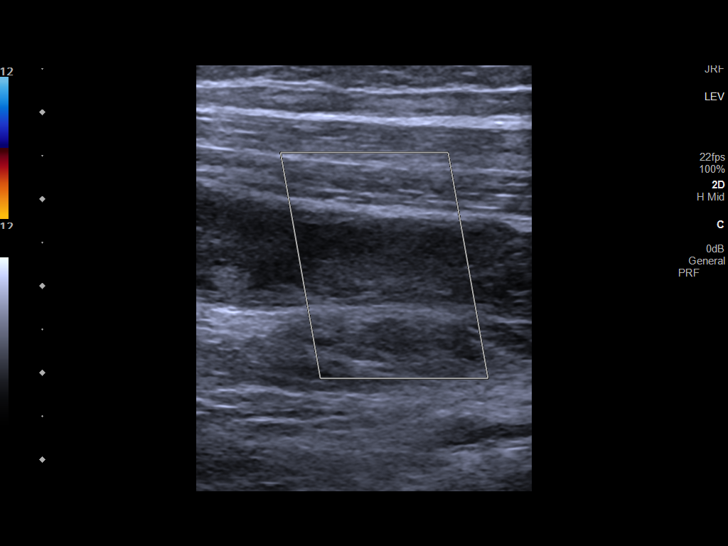
[im 21/33]
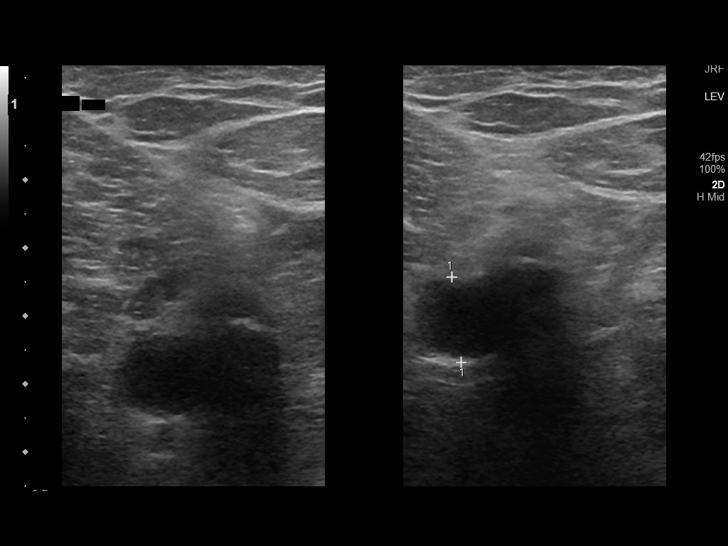
[im 24/33]
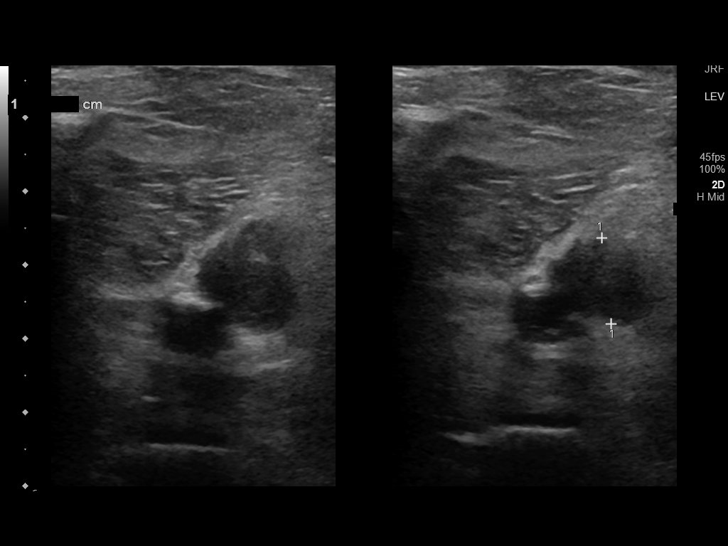
[im 27/33]
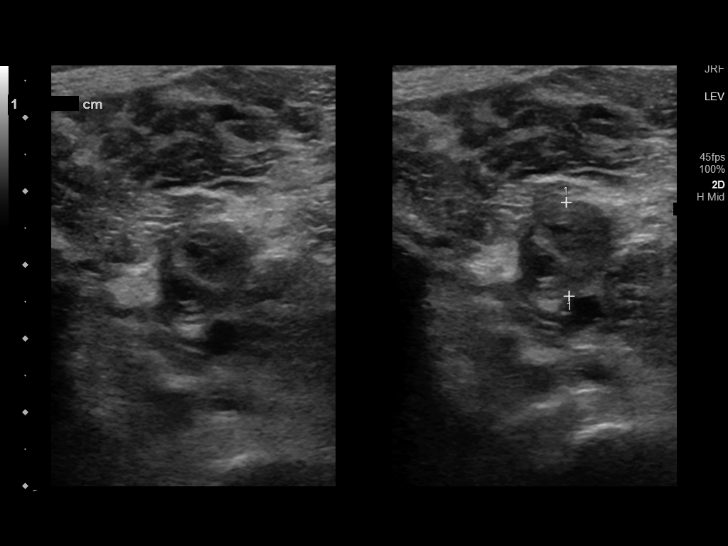
[im 30/33]
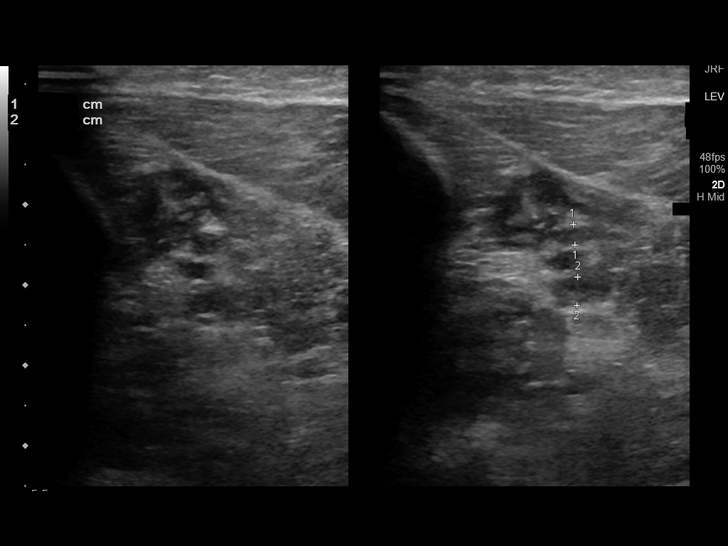
[im 33/33]
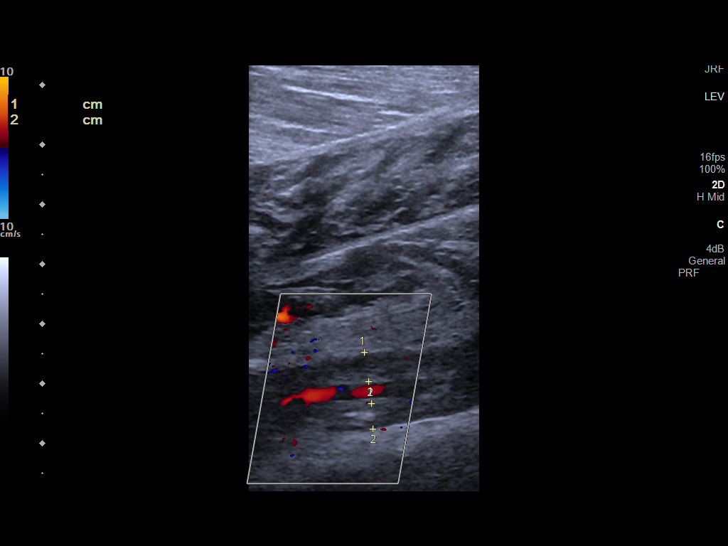

[13 of 24 positions shown; findings below may reference images not displayed]

FINDINGS: Contralateral Common Femoral Vein: Negative for thrombus.
Compressibility with color Doppler flow and normal phasicity.

Common Femoral Vein: Normal compressibility and color Doppler flow
in the proximal common femoral vein. There is thrombus in the
proximal femoral vein which may be involving the distal common
femoral vein.

Saphenofemoral Junction: No evidence of thrombus. Normal
compressibility and color Doppler flow.

Profunda Femoral Vein: No evidence of thrombus. Normal color Doppler
flow in right profunda femoral vein.

Femoral Vein: Positive for thrombus. Echogenic thrombus in the
proximal, mid and distal femoral vein. Thrombus may extend into the
distal common femoral vein as well. Minimal flow in the proximal
femoral vein. The thrombus in the mid and distal femoral vein is
occlusive.

Popliteal Vein: Positive for thrombus. Echogenic occlusive thrombus
in the popliteal vein.

Calf Veins: Positive for thrombus. The paired posterior tibial veins
and peroneal veins contain thrombus without significant color
Doppler flow.

Other Findings:  None.
IMPRESSION: Positive for deep venous thrombosis in the right lower extremity.
Thrombus involving the right femoral vein, right popliteal vein and
visualized right deep calf veins. Thrombus may also involve a
portion of the distal right common femoral vein.

These results will be called to the ordering clinician or
representative by the Radiologist Assistant, and communication
documented in the PACS or [REDACTED].

## 2022-11-06 DIAGNOSIS — E119 Type 2 diabetes mellitus without complications: Secondary | ICD-10-CM | POA: Diagnosis not present

## 2022-11-06 DIAGNOSIS — H43813 Vitreous degeneration, bilateral: Secondary | ICD-10-CM | POA: Diagnosis not present

## 2022-11-06 DIAGNOSIS — H3554 Dystrophies primarily involving the retinal pigment epithelium: Secondary | ICD-10-CM | POA: Diagnosis not present

## 2022-11-06 DIAGNOSIS — G245 Blepharospasm: Secondary | ICD-10-CM | POA: Diagnosis not present

## 2022-11-24 ENCOUNTER — Ambulatory Visit (INDEPENDENT_AMBULATORY_CARE_PROVIDER_SITE_OTHER): Payer: PPO | Admitting: Vascular Surgery

## 2022-11-24 ENCOUNTER — Encounter (INDEPENDENT_AMBULATORY_CARE_PROVIDER_SITE_OTHER): Payer: Self-pay | Admitting: Vascular Surgery

## 2022-11-24 ENCOUNTER — Ambulatory Visit (INDEPENDENT_AMBULATORY_CARE_PROVIDER_SITE_OTHER): Payer: PPO

## 2022-11-24 VITALS — BP 112/67 | HR 69 | Resp 16 | Wt 182.2 lb

## 2022-11-24 DIAGNOSIS — E118 Type 2 diabetes mellitus with unspecified complications: Secondary | ICD-10-CM | POA: Diagnosis not present

## 2022-11-24 DIAGNOSIS — I10 Essential (primary) hypertension: Secondary | ICD-10-CM

## 2022-11-24 DIAGNOSIS — I70211 Atherosclerosis of native arteries of extremities with intermittent claudication, right leg: Secondary | ICD-10-CM

## 2022-11-24 DIAGNOSIS — Z720 Tobacco use: Secondary | ICD-10-CM

## 2022-11-24 NOTE — Assessment & Plan Note (Signed)
ABIs today are 0.68 on the right and 0.83 on the left with digit pressure 81 on the right and 127 on the left.  ABIs were previously 0.56 on the right and 0.77 six months ago.  Symptoms are currently stable.  We will continue medical management which includes Eliquis.  Follow-up in 6 months with noninvasive studies.

## 2022-11-24 NOTE — Assessment & Plan Note (Signed)

## 2022-11-24 NOTE — Progress Notes (Signed)
MRN : [id number]  Jonathon Barrera is a 82 y.o. ([birth year]) male who presents with chief complaint of  Chief Complaint  Patient presents with   Follow-up    Ultrasound follow up  .  History of Present Illness: Patient returns today in follow up of his PAD.  He has a known history of PAD with moderate claudication symptoms that we have been following.  Attempts at crossing his recurrent right SFA occlusion after previous revascularization and been unsuccessful, and about a year ago we discussed bypass versus continued medical management and exercise.  We opted for the latter and he has done reasonably well with this.  Symptoms are about the same to slightly improved.  No rest pain.  No ulceration.  ABIs today are 0.68 on the right and 0.83 on the left with digit pressure 81 on the right and 127 on the left.  ABIs were previously 0.56 on the right and 0.77 six months ago.  He does still smoke and we had a long discussion today on why this is deleterious to his vascular system.  As of yet, he has been unable to quit.  Current Outpatient Medications  Medication Sig Dispense Refill   apixaban (ELIQUIS) 5 MG TABS tablet Take 1 tablet (5 mg total) by mouth 2 (two) times daily. 180 tablet 1   Artificial Tear Solution (SOOTHE XP) SOLN Place 1 drop into both eyes 3 (three) times daily as needed (dry eyes).     ferrous sulfate 325 (65 FE) MG tablet Take 325 mg by mouth 2 (two) times daily with a meal.     finasteride (PROSCAR) 5 MG tablet Take 5 mg by mouth daily.     glimepiride (AMARYL) 4 MG tablet Take 4 mg by mouth in the morning and at bedtime.     hydrochlorothiazide (HYDRODIURIL) 25 MG tablet Take 25 mg by mouth daily.     Misc Natural Products (URINOZINC PLUS PO) Take 1 tablet by mouth 2 (two) times daily.      pioglitazone (ACTOS) 30 MG tablet Take by mouth.     telmisartan (MICARDIS) 80 MG tablet Take 80 mg by mouth daily.     No current facility-administered medications for this visit.     Past Medical History:  Diagnosis Date   Actinic keratosis    Acute deep vein thrombosis (DVT) of distal vein of right lower extremity (HCC) 06/19/2020   Aortic atherosclerosis (HCC)    Bladder carcinoma (HCC) 05/26/2007   a.) papillary-transitional cell   BPH (benign prostatic hyperplasia)    Colon polyps    COPD (chronic obstructive pulmonary disease) (HCC)    Current use of long term anticoagulation    a.) Apixaban   Diverticulosis    DOE (dyspnea on exertion)    HOH (hard of hearing)    a.) uses BILATERAL assistive devices   Hyperlipemia    Hypertension    Mass    Lt lower cheek   Melanoma (HCC) 2000   Tx by Dr Orson Aloe   PAD (peripheral artery disease) [facility])    Peripheral vascular disease (HCC)    Pulmonary embolism (HCC) 06/19/2020   a.) RLL; no associated RIGHT heart strain   RBBB (right bundle branch block)    Squamous cell carcinoma of skin 02/01/2017   Left dorsum latera. hand. EDC   Squamous cell carcinoma of skin 02/01/2017   Left dorsum base of thumb. EDC   T2DM (type 2 diabetes mellitus) (HCC)     Past  Surgical History:  Procedure Laterality Date   back skin cancer     BLADDER SURGERY     CARDIAC CATHETERIZATION     CATARACT EXTRACTION W/PHACO Right 04/30/2015   Procedure: CATARACT EXTRACTION PHACO AND INTRAOCULAR LENS PLACEMENT (IOC);  Surgeon: Galen Manila, MD;  Location: [facility] ORS;  Service: Ophthalmology;  Laterality: Right;  [place] 01:03 AP% 21.4 CDE  13.57 fluid pack lot # [id number]   CATARACT EXTRACTION W/PHACO Left 05/21/2015   Procedure: CATARACT EXTRACTION PHACO AND INTRAOCULAR LENS PLACEMENT (IOC);  Surgeon: Galen Manila, MD;  Location: [facility] ORS;  Service: Ophthalmology;  Laterality: Left;  [place] 00:52 AP% 18.9 CDE 9.95 fluid pack lot #[id number]   COLONOSCOPY WITH PROPOFOL N/A 11/05/2014   Procedure: COLONOSCOPY WITH PROPOFOL;  Surgeon: Wallace Cullens, MD;  Location: [facility] ENDOSCOPY;  Service: Gastroenterology;  Laterality: N/A;   COLONOSCOPY  WITH PROPOFOL N/A 05/19/2021   Procedure: COLONOSCOPY WITH PROPOFOL;  Surgeon: Jaynie Collins, DO;  Location: [facility] ENDOSCOPY;  Service: Gastroenterology;  Laterality: N/A;  DM Patient is hard of hearing and would like for wife to answer questions, please.   ENDARTERECTOMY FEMORAL Right 02/26/2021   Procedure: ENDARTERECTOMY FEMORAL (SFA STENT PLACEMEN);  Surgeon: Annice Needy, MD;  Location: [facility] ORS;  Service: Vascular;  Laterality: Right;   ESOPHAGOGASTRODUODENOSCOPY (EGD) WITH PROPOFOL N/A 05/19/2021   Procedure: ESOPHAGOGASTRODUODENOSCOPY (EGD) WITH PROPOFOL;  Surgeon: Jaynie Collins, DO;  Location: [facility] ENDOSCOPY;  Service: Gastroenterology;  Laterality: N/A;   HERNIA REPAIR     inguinal   KYPHOPLASTY N/A 02/23/2019   Procedure: T12 KYPHOPLASTY;  Surgeon: Kennedy Bucker, MD;  Location: [facility] ORS;  Service: Orthopedics;  Laterality: N/A;   LESION EXCISION N/A 02/23/2018   Procedure: EXCISION SCALP CYST AND FACIAL CYST;  Surgeon: Carolan Shiver, MD;  Location: [facility] ORS;  Service: General;  Laterality: N/A;   LOWER EXTREMITY ANGIOGRAPHY Right 01/23/2021   Procedure: LOWER EXTREMITY ANGIOGRAPHY;  Surgeon: Annice Needy, MD;  Location: [facility] INVASIVE CV LAB;  Service: Cardiovascular;  Laterality: Right;   LOWER EXTREMITY ANGIOGRAPHY Right 01/12/2022   Procedure: Lower Extremity Angiography;  Surgeon: Annice Needy, MD;  Location: [facility] INVASIVE CV LAB;  Service: Cardiovascular;  Laterality: Right;   LOWER EXTREMITY ANGIOGRAPHY Right 01/26/2022   Procedure: Lower Extremity Angiography;  Surgeon: Annice Needy, MD;  Location: [facility] INVASIVE CV LAB;  Service: Cardiovascular;  Laterality: Right;   SHOULDER ARTHROSCOPY W/ ROTATOR CUFF REPAIR Right    TUR-BT       Social History   Tobacco Use   Smoking status: Every Day    Current packs/day: 0.50    Types: Cigarettes   Smokeless tobacco: Never  Vaping Use   Vaping status: Never Used  Substance Use Topics   Alcohol use: Yes     Alcohol/week: 6.0 standard drinks of alcohol    Types: 6 Cans of beer per week   Drug use: No      Family History  Problem Relation Age of Onset   Diabetes Mother    Colon cancer Mother    Diabetes Father      No Known Allergies   REVIEW OF SYSTEMS (Negative unless checked)   Constitutional: [] Weight loss  [] Fever  [] Chills Cardiac: [] Chest pain   [] Chest pressure   [] Palpitations   [] Shortness of breath when laying flat   [] Shortness of breath at rest   [] Shortness of breath with exertion. Vascular:  [x] Pain in legs with walking   [] Pain in legs at rest   [] Pain  in legs when laying flat   [x] Claudication   [] Pain in feet when walking  [] Pain in feet at rest  [] Pain in feet when laying flat   [] History of DVT   [] Phlebitis   [] Swelling in legs   [] Varicose veins   [] Non-healing ulcers Pulmonary:   [] Uses home oxygen   [] Productive cough   [] Hemoptysis   [] Wheeze  [] COPD   [] Asthma Neurologic:  [] Dizziness  [] Blackouts   [] Seizures   [] History of stroke   [] History of TIA  [] Aphasia   [] Temporary blindness   [] Dysphagia   [] Weakness or numbness in arms   [] Weakness or numbness in legs Musculoskeletal:  [x] Arthritis   [] Joint swelling   [] Joint pain   [] Low back pain Hematologic:  [] Easy bruising  [] Easy bleeding   [] Hypercoagulable state   [] Anemic   Gastrointestinal:  [] Blood in stool   [] Vomiting blood  [] Gastroesophageal reflux/heartburn   [] Abdominal pain Genitourinary:  [] Chronic kidney disease   [] Difficult urination  [] Frequent urination  [] Burning with urination   [] Hematuria Skin:  [] Rashes   [] Ulcers   [] Wounds Psychological:  [] History of anxiety   []  History of major depression.  Physical Examination  BP 112/67 (BP Location: Right Arm)   Pulse 69   Resp 16   Wt 182 lb 3.2 oz (82.6 kg)   BMI 26.91 kg/m  Gen:  WD/WN, NAD. Appears younger than stated age. Head: [place]/AT, No temporalis wasting. Ear/Nose/Throat: Hearing grossly intact, nares w/o erythema or  drainage Eyes: Conjunctiva clear. Sclera non-icteric Neck: Supple.  Trachea midline Pulmonary:  Good air movement, no use of accessory muscles.  Cardiac: RRR, no JVD Vascular:  Vessel Right Left  Radial Palpable Palpable                          PT 1+ Palpable 1+ Palpable  DP Not Palpable 1+ Palpable   Gastrointestinal: soft, non-tender/non-distended. No guarding/reflex.  Musculoskeletal: M/S 5/5 throughout.  No deformity or atrophy. No edema. Neurologic: Sensation grossly intact in extremities.  Symmetrical.  Speech is fluent.  Psychiatric: Judgment intact, Mood & affect appropriate for pt's clinical situation. Dermatologic: No rashes or ulcers noted.  No cellulitis or open wounds.      Labs No results found for this or any previous visit (from the past 2160 hour(s)).  Radiology No results found.  Assessment/Plan Hypertension blood pressure control important in reducing the progression of atherosclerotic disease. On appropriate oral medications.     Type II diabetes mellitus with complication (HCC) blood glucose control important in reducing the progression of atherosclerotic disease. Also, involved in wound healing. On appropriate medications.  Tobacco abuse We had a discussion for approximately 3 minutes regarding the absolute need for smoking cessation due to the deleterious nature of tobacco on the vascular system. We discussed the tobacco use would diminish patency of any intervention, and likely significantly worsen progressio of disease. We discussed multiple agents for quitting including replacement therapy or medications to reduce cravings such as Chantix. The patient voices their understanding of the importance of smoking cessation.   Atherosclerosis of native arteries of extremity with intermittent claudication (HCC) ABIs today are 0.68 on the right and 0.83 on the left with digit pressure 81 on the right and 127 on the left.  ABIs were previously 0.56 on the  right and 0.77 six months ago.  Symptoms are currently stable.  We will continue medical management which includes Eliquis.  Follow-up in 6 months with noninvasive  studies.    Festus Barren, MD  11/24/2022 9:43 AM    This note was created with Dragon medical transcription system.  Any errors from dictation are purely unintentional

## 2023-01-06 ENCOUNTER — Encounter: Payer: Self-pay | Admitting: Dermatology

## 2023-01-06 ENCOUNTER — Ambulatory Visit: Payer: PPO | Admitting: Dermatology

## 2023-01-06 VITALS — BP 150/82

## 2023-01-06 DIAGNOSIS — Z85820 Personal history of malignant melanoma of skin: Secondary | ICD-10-CM

## 2023-01-06 DIAGNOSIS — L570 Actinic keratosis: Secondary | ICD-10-CM | POA: Diagnosis not present

## 2023-01-06 DIAGNOSIS — W908XXA Exposure to other nonionizing radiation, initial encounter: Secondary | ICD-10-CM | POA: Diagnosis not present

## 2023-01-06 DIAGNOSIS — L729 Follicular cyst of the skin and subcutaneous tissue, unspecified: Secondary | ICD-10-CM

## 2023-01-06 DIAGNOSIS — L578 Other skin changes due to chronic exposure to nonionizing radiation: Secondary | ICD-10-CM

## 2023-01-06 DIAGNOSIS — L720 Epidermal cyst: Secondary | ICD-10-CM | POA: Diagnosis not present

## 2023-01-06 DIAGNOSIS — C44629 Squamous cell carcinoma of skin of left upper limb, including shoulder: Secondary | ICD-10-CM

## 2023-01-06 DIAGNOSIS — D1801 Hemangioma of skin and subcutaneous tissue: Secondary | ICD-10-CM | POA: Diagnosis not present

## 2023-01-06 DIAGNOSIS — L820 Inflamed seborrheic keratosis: Secondary | ICD-10-CM | POA: Diagnosis not present

## 2023-01-06 DIAGNOSIS — Z85828 Personal history of other malignant neoplasm of skin: Secondary | ICD-10-CM

## 2023-01-06 DIAGNOSIS — L814 Other melanin hyperpigmentation: Secondary | ICD-10-CM | POA: Diagnosis not present

## 2023-01-06 DIAGNOSIS — Z872 Personal history of diseases of the skin and subcutaneous tissue: Secondary | ICD-10-CM

## 2023-01-06 DIAGNOSIS — L821 Other seborrheic keratosis: Secondary | ICD-10-CM

## 2023-01-06 DIAGNOSIS — Z1283 Encounter for screening for malignant neoplasm of skin: Secondary | ICD-10-CM | POA: Diagnosis not present

## 2023-01-06 DIAGNOSIS — D492 Neoplasm of unspecified behavior of bone, soft tissue, and skin: Secondary | ICD-10-CM

## 2023-01-06 DIAGNOSIS — Z8589 Personal history of malignant neoplasm of other organs and systems: Secondary | ICD-10-CM

## 2023-01-06 DIAGNOSIS — D229 Melanocytic nevi, unspecified: Secondary | ICD-10-CM

## 2023-01-06 HISTORY — DX: Personal history of other malignant neoplasm of skin: Z85.828

## 2023-01-06 NOTE — Patient Instructions (Addendum)
Wound Care Instructions  Cleanse wound gently with soap and water once a day then pat dry with clean gauze. Apply a thin coat of Petrolatum (petroleum jelly, "Vaseline") over the wound (unless you have an allergy to this). We recommend that you use a new, sterile tube of Vaseline. Do not pick or remove scabs. Do not remove the yellow or white "healing tissue" from the base of the wound.  Cover the wound with fresh, clean, nonstick gauze and secure with paper tape. You may use Band-Aids in place of gauze and tape if the wound is small enough, but would recommend trimming much of the tape off as there is often too much. Sometimes Band-Aids can irritate the skin.  You should call the office for your biopsy report after 1 week if you have not already been contacted.  If you experience any problems, such as abnormal amounts of bleeding, swelling, significant bruising, significant pain, or evidence of infection, please call the office immediately.  FOR ADULT SURGERY PATIENTS: If you need something for pain relief you may take 1 extra strength Tylenol (acetaminophen) AND 2 Ibuprofen (200mg  each) together every 4 hours as needed for pain. (do not take these if you are allergic to them or if you have a reason you should not take them.) Typically, you may only need pain medication for 1 to 3 days.    Cryotherapy Aftercare  Wash gently with soap and water everyday.   Apply Vaseline and Band-Aid daily until healed.    Due to recent changes in healthcare laws, you may see results of your pathology and/or laboratory studies on MyChart before the doctors have had a chance to review them. We understand that in some cases there may be results that are confusing or concerning to you. Please understand that not all results are received at the same time and often the doctors may need to interpret multiple results in order to provide you with the best plan of care or course of treatment. Therefore, we ask that you  please give [place] 2 business days to thoroughly review all your results before contacting the office for clarification. Should we see a critical lab result, you will be contacted sooner.   If You Need Anything After Your Visit  If you have any questions or concerns for your doctor, please call our main line at [phone] and press option 4 to reach your doctor's medical assistant. If no one answers, please leave a voicemail as directed and we will return your call as soon as possible. Messages left after 4 pm will be answered the following business day.   You may also send [place] a message via MyChart. We typically respond to MyChart messages within 1-2 business days.  For prescription refills, please ask your pharmacy to contact our office. Our fax number is [phone].  If you have an urgent issue when the clinic is closed that cannot wait until the next business day, you can page your doctor at the number below.    Please note that while we do our best to be available for urgent issues outside of office hours, we are not available 24/7.   If you have an urgent issue and are unable to reach [place], you may choose to seek medical care at your doctor's office, retail clinic, urgent care center, or emergency room.  If you have a medical emergency, please immediately call 911 or go to the emergency department.  Pager Numbers  - Dr. Gwen Pounds: [phone]  -  Dr. Roseanne Reno: [phone]  - Dr. Katrinka Blazing: [phone]   In the event of inclement weather, please call our main line at [phone] for an update on the status of any delays or closures.  Dermatology Medication Tips: Please keep the boxes that topical medications come in in order to help keep track of the instructions about where and how to use these. Pharmacies typically print the medication instructions only on the boxes and not directly on the medication tubes.   If your medication is too expensive, please contact our office at  [phone] option 4 or send [place] a message through MyChart.   We are unable to tell what your co-pay for medications will be in advance as this is different depending on your insurance coverage. However, we may be able to find a substitute medication at lower cost or fill out paperwork to get insurance to cover a needed medication.   If a prior authorization is required to get your medication covered by your insurance company, please allow [place] 1-2 business days to complete this process.  Drug prices often vary depending on where the prescription is filled and some pharmacies may offer cheaper prices.  The website www.goodrx.com contains coupons for medications through different pharmacies. The prices here do not account for what the cost may be with help from insurance (it may be cheaper with your insurance), but the website can give you the price if you did not use any insurance.  - You can print the associated coupon and take it with your prescription to the pharmacy.  - You may also stop by our office during regular business hours and pick up a GoodRx coupon card.  - If you need your prescription sent electronically to a different pharmacy, notify our office through [facility] or by phone at [phone] option 4.     Si Usted Necesita Algo Despus de Su Visita  Tambin puede enviarnos un mensaje a travs de Clinical cytogeneticist. Por lo general respondemos a los mensajes de MyChart en el transcurso de 1 a 2 das hbiles.  Para renovar recetas, por favor pida a su farmacia que se ponga en contacto con nuestra oficina. Annie Sable de fax es [place] [phone].  Si tiene un asunto urgente cuando la clnica est cerrada y que no puede esperar hasta el siguiente da hbil, puede llamar/localizar a su doctor(a) al nmero que aparece a continuacin.   Por favor, tenga en cuenta que aunque hacemos todo lo posible para estar disponibles para asuntos urgentes fuera del horario de [place], no estamos  disponibles las 24 horas del da, los 7 [address]  [address] de la [place].   Si tiene un problema urgente y no puede comunicarse con nosotros, puede optar por buscar atencin mdica  en el consultorio de su doctor(a), en una clnica privada, en un centro de atencin urgente o en una sala de emergencias.  Si tiene Engineer, drilling, por favor llame inmediatamente al 911 o vaya a la sala de emergencias.  Nmeros de bper  - Dr. Gwen Pounds: [phone]  - Dra. Roseanne Reno: [phone]  - Dr. Katrinka Blazing: [phone]   En caso de inclemencias del tiempo, por favor llame a Lacy Duverney principal al [phone] para una actualizacin sobre el [place] de cualquier retraso o cierre.  Consejos para la medicacin en dermatologa: Por favor, guarde las cajas en las que vienen los medicamentos de uso tpico para ayudarle a seguir las instrucciones sobre dnde y cmo usarlos. Las farmacias generalmente imprimen las instrucciones del medicamento slo en las  cajas y no directamente en los tubos del medicamento.   Si su medicamento es muy caro, por favor, pngase en contacto con Rolm Gala llamando al [phone] y presione la opcin 4 o envenos un mensaje a travs de Clinical cytogeneticist.   No podemos decirle cul ser su copago por los medicamentos por adelantado ya que esto es diferente dependiendo de la cobertura de su seguro. Sin embargo, es posible que podamos encontrar un medicamento sustituto a Audiological scientist un formulario para que el seguro cubra el medicamento que se considera necesario.   Si se requiere una autorizacin previa para que su compaa de seguros [place] su medicamento, por favor permtanos de 1 a 2 das hbiles para completar [address]  Los precios de los medicamentos varan con frecuencia dependiendo del Environmental consultant de dnde se surte la receta y alguna farmacias pueden ofrecer precios ms baratos.  El sitio web www.goodrx.com tiene cupones para medicamentos de Health and safety inspector. Los precios aqu no  tienen en cuenta lo que podra costar con la ayuda del seguro (puede ser ms barato con su seguro), pero el sitio web puede darle el precio si no utiliz Tourist information centre manager.  - Puede imprimir el cupn correspondiente y llevarlo con su receta a la farmacia.  - Tambin puede pasar por nuestra oficina durante el horario de atencin regular y Education officer, museum una tarjeta de cupones de GoodRx.  - Si necesita que su receta se enve electrnicamente a una farmacia diferente, informe a nuestra oficina a travs de MyChart de [place] o por telfono llamando al [phone] y presione la opcin 4.

## 2023-01-06 NOTE — Progress Notes (Unsigned)
Follow-Up Visit   Subjective  Jonathon Barrera is a 82 y.o. male who presents for the following: Skin Cancer Screening and Full Body Skin Exam, hx of Melanoma, hx of SCC, hx of Aks, check spots L side, L arm, some itchy, ~62m  Patient accompanied by wife.  The patient presents for Total-Body Skin Exam (TBSE) for skin cancer screening and mole check. The patient has spots, moles and lesions to be evaluated, some may be new or changing and the patient may have concern these could be cancer.    The following portions of the chart were reviewed this encounter and updated as appropriate: medications, allergies, medical history  Review of Systems:  No other skin or systemic complaints except as noted in HPI or Assessment and Plan.  Objective  Well appearing patient in no apparent distress; mood and affect are within normal limits.  A full examination was performed including scalp, head, eyes, ears, nose, lips, neck, chest, axillae, abdomen, back, buttocks, bilateral upper extremities, bilateral lower extremities, hands, feet, fingers, toes, fingernails, and toenails. All findings within normal limits unless otherwise noted below.   Relevant physical exam findings are noted in the Assessment and Plan.  Right Ear, hands, arms >15 (16) Pink scaly macules  bil hands, arms, L infra axillary >15 (16) Stuck on waxy paps with erythema  L deltoid 1.2 scaly hyperkeratotic pap       Assessment & Plan   SKIN CANCER SCREENING PERFORMED TODAY.  ACTINIC DAMAGE - Chronic condition, secondary to cumulative UV/sun exposure - diffuse scaly erythematous macules with underlying dyspigmentation - Recommend daily broad spectrum sunscreen SPF 30+ to sun-exposed areas, reapply every 2 hours as needed.  - Staying in the shade or wearing long sleeves, sun glasses (UVA+UVB protection) and wide brim hats (4-inch brim around the entire circumference of the hat) are also recommended for sun protection.  -  Call for new or changing lesions.  LENTIGINES, SEBORRHEIC KERATOSES, HEMANGIOMAS - Benign normal skin lesions - Benign-appearing - Call for any changes  MELANOCYTIC NEVI - Tan-brown and/or pink-flesh-colored symmetric macules and papules - Benign appearing on exam today - Observation - Call clinic for new or changing moles - Recommend daily use of broad spectrum spf 30+ sunscreen to sun-exposed areas.   HISTORY OF MELANOMA - No evidence of recurrence today - No lymphadenopathy - Recommend regular full body skin exams - Recommend daily broad spectrum sunscreen SPF 30+ to sun-exposed areas, reapply every 2 hours as needed.  - Call if any new or changing lesions are noted between office visits  - Back txted by Dr. Orson Aloe 2000  HISTORY OF SQUAMOUS CELL CARCINOMA OF THE SKIN - No evidence of recurrence today - No lymphadenopathy - Recommend regular full body skin exams - Recommend daily broad spectrum sunscreen SPF 30+ to sun-exposed areas, reapply every 2 hours as needed.  - Call if any new or changing lesions are noted between office visits - L dorsum lateral hand, L dorsum base of thumb   AK (actinic keratosis) (16) Right Ear, hands, arms >15  Actinic keratoses are precancerous spots that appear secondary to cumulative UV radiation exposure/sun exposure over time. They are chronic with expected duration over 1 year. A portion of actinic keratoses will progress to squamous cell carcinoma of the skin. It is not possible to reliably predict which spots will progress to skin cancer and so treatment is recommended to prevent development of skin cancer.  Recommend daily broad spectrum sunscreen SPF 30+ to sun-exposed  areas, reapply every 2 hours as needed.  Recommend staying in the shade or wearing long sleeves, sun glasses (UVA+UVB protection) and wide brim hats (4-inch brim around the entire circumference of the hat). Call for new or changing lesions.  Destruction of lesion -  Right Ear, hands, arms >15 (16) Complexity: simple   Destruction method: cryotherapy   Informed consent: discussed and consent obtained   Timeout:  patient name, date of birth, surgical site, and procedure verified Lesion destroyed using liquid nitrogen: Yes   Region frozen until ice ball extended beyond lesion: Yes   Outcome: patient tolerated procedure well with no complications   Post-procedure details: wound care instructions given    Inflamed seborrheic keratosis (16) bil hands, arms, L infra axillary >15  Symptomatic, irritating, patient would like treated.  Destruction of lesion - bil hands, arms, L infra axillary >15 (16) Complexity: simple   Destruction method: cryotherapy   Informed consent: discussed and consent obtained   Timeout:  patient name, date of birth, surgical site, and procedure verified Lesion destroyed using liquid nitrogen: Yes   Region frozen until ice ball extended beyond lesion: Yes   Outcome: patient tolerated procedure well with no complications   Post-procedure details: wound care instructions given    Neoplasm of skin L deltoid  Epidermal / dermal shaving  Lesion diameter (cm):  1.2 Informed consent: discussed and consent obtained   Timeout: patient name, date of birth, surgical site, and procedure verified   Procedure prep:  Patient was prepped and draped in usual sterile fashion Prep type:  Isopropyl alcohol Anesthesia: the lesion was anesthetized in a standard fashion   Anesthetic:  1% lidocaine w/ epinephrine 1-100,000 buffered w/ 8.4% NaHCO3 Instrument used: flexible razor blade   Hemostasis achieved with: pressure, aluminum chloride and electrodesiccation   Outcome: patient tolerated procedure well   Post-procedure details: sterile dressing applied and wound care instructions given   Dressing type: bandage and petrolatum    Destruction of lesion Complexity: extensive   Destruction method: electrodesiccation and curettage   Informed  consent: discussed and consent obtained   Timeout:  patient name, date of birth, surgical site, and procedure verified Procedure prep:  Patient was prepped and draped in usual sterile fashion Prep type:  Isopropyl alcohol Anesthesia: the lesion was anesthetized in a standard fashion   Anesthetic:  1% lidocaine w/ epinephrine 1-100,000 buffered w/ 8.4% NaHCO3 Curettage performed in three different directions: Yes   Electrodesiccation performed over the curetted area: Yes   Final wound size (cm):  1.2 Hemostasis achieved with:  pressure, aluminum chloride and electrodesiccation Outcome: patient tolerated procedure well with no complications   Post-procedure details: sterile dressing applied and wound care instructions given   Dressing type: bandage and petrolatum    Specimen 1 - Surgical pathology Differential Diagnosis: D48.5 R/O SCC  Check Margins: yes 1.2 scaly hyperkeratotic pap EDC  Skin cancer screening  Actinic skin damage  History of malignant melanoma  History of squamous cell carcinoma  Lentigo  Melanocytic nevus, unspecified location  Cyst of skin   EPIDERMAL INCLUSION CYST Exam: Subcutaneous nodule at L post neck  Benign-appearing. Exam most consistent with an epidermal inclusion cyst. Discussed that a cyst is a benign growth that can grow over time and sometimes get irritated or inflamed. Recommend observation if it is not bothersome. Discussed option of surgical excision to remove it if it is growing, symptomatic, or other changes noted. Please call for new or changing lesions so they can be evaluated.  Return in about 5 months (around 06/08/2023) for AK f/u, ISK f/u, recheck L infra axillary.  I, Ardis Rowan, RMA, am acting as scribe for Armida Sans, MD .   Documentation: I have reviewed the above documentation for accuracy and completeness, and I agree with the above.  Armida Sans, MD

## 2023-01-08 LAB — SURGICAL PATHOLOGY

## 2023-01-12 ENCOUNTER — Telehealth: Payer: Self-pay

## 2023-01-12 NOTE — Telephone Encounter (Addendum)
Tried calling patient regarding bx results. No answer. LMOM for patient to return call.    ----- Message from Armida Sans sent at 01/12/2023 10:34 AM EDT ----- FINAL DIAGNOSIS        1. Skin, L deltoid :       WELL DIFFERENTIATED SQUAMOUS CELL CARCINOMA, BASE INVOLVED   Cancer = SCC Already treated Recheck next visit

## 2023-01-13 NOTE — Telephone Encounter (Signed)
Patient's spouse advised of BX result. aw

## 2023-02-10 ENCOUNTER — Inpatient Hospital Stay: Payer: PPO | Attending: Internal Medicine

## 2023-02-10 DIAGNOSIS — D472 Monoclonal gammopathy: Secondary | ICD-10-CM | POA: Diagnosis not present

## 2023-02-10 DIAGNOSIS — N401 Enlarged prostate with lower urinary tract symptoms: Secondary | ICD-10-CM | POA: Diagnosis not present

## 2023-02-10 DIAGNOSIS — Z86711 Personal history of pulmonary embolism: Secondary | ICD-10-CM | POA: Insufficient documentation

## 2023-02-10 DIAGNOSIS — D696 Thrombocytopenia, unspecified: Secondary | ICD-10-CM | POA: Insufficient documentation

## 2023-02-10 DIAGNOSIS — F17210 Nicotine dependence, cigarettes, uncomplicated: Secondary | ICD-10-CM | POA: Diagnosis not present

## 2023-02-10 DIAGNOSIS — D509 Iron deficiency anemia, unspecified: Secondary | ICD-10-CM | POA: Insufficient documentation

## 2023-02-10 DIAGNOSIS — Z79899 Other long term (current) drug therapy: Secondary | ICD-10-CM | POA: Diagnosis not present

## 2023-02-10 DIAGNOSIS — Z7984 Long term (current) use of oral hypoglycemic drugs: Secondary | ICD-10-CM | POA: Insufficient documentation

## 2023-02-10 DIAGNOSIS — E118 Type 2 diabetes mellitus with unspecified complications: Secondary | ICD-10-CM | POA: Diagnosis not present

## 2023-02-10 DIAGNOSIS — Z7901 Long term (current) use of anticoagulants: Secondary | ICD-10-CM | POA: Insufficient documentation

## 2023-02-10 DIAGNOSIS — I10 Essential (primary) hypertension: Secondary | ICD-10-CM | POA: Diagnosis not present

## 2023-02-10 DIAGNOSIS — Z86718 Personal history of other venous thrombosis and embolism: Secondary | ICD-10-CM | POA: Insufficient documentation

## 2023-02-10 DIAGNOSIS — Z23 Encounter for immunization: Secondary | ICD-10-CM | POA: Diagnosis not present

## 2023-02-10 DIAGNOSIS — E782 Mixed hyperlipidemia: Secondary | ICD-10-CM | POA: Diagnosis not present

## 2023-02-10 DIAGNOSIS — J431 Panlobular emphysema: Secondary | ICD-10-CM | POA: Diagnosis not present

## 2023-02-10 DIAGNOSIS — Z0000 Encounter for general adult medical examination without abnormal findings: Secondary | ICD-10-CM | POA: Diagnosis not present

## 2023-02-10 LAB — CBC WITH DIFFERENTIAL (CANCER CENTER ONLY)
Abs Immature Granulocytes: 0.04 10*3/uL (ref 0.00–0.07)
Basophils Absolute: 0.1 10*3/uL (ref 0.0–0.1)
Basophils Relative: 2 %
Eosinophils Absolute: 0.1 10*3/uL (ref 0.0–0.5)
Eosinophils Relative: 4 %
HCT: 37.4 % — ABNORMAL LOW (ref 39.0–52.0)
Hemoglobin: 12.4 g/dL — ABNORMAL LOW (ref 13.0–17.0)
Immature Granulocytes: 1 %
Lymphocytes Relative: 30 %
Lymphs Abs: 1.2 10*3/uL (ref 0.7–4.0)
MCH: 33.5 pg (ref 26.0–34.0)
MCHC: 33.2 g/dL (ref 30.0–36.0)
MCV: 101.1 fL — ABNORMAL HIGH (ref 80.0–100.0)
Monocytes Absolute: 0.4 10*3/uL (ref 0.1–1.0)
Monocytes Relative: 9 %
Neutro Abs: 2.1 10*3/uL (ref 1.7–7.7)
Neutrophils Relative %: 54 %
Platelet Count: 152 10*3/uL (ref 150–400)
RBC: 3.7 MIL/uL — ABNORMAL LOW (ref 4.22–5.81)
RDW: 14 % (ref 11.5–15.5)
WBC Count: 4 10*3/uL (ref 4.0–10.5)
nRBC: 0 % (ref 0.0–0.2)

## 2023-02-10 LAB — BASIC METABOLIC PANEL - CANCER CENTER ONLY
Anion gap: 6 (ref 5–15)
BUN: 21 mg/dL (ref 8–23)
CO2: 26 mmol/L (ref 22–32)
Calcium: 9.4 mg/dL (ref 8.9–10.3)
Chloride: 102 mmol/L (ref 98–111)
Creatinine: 0.97 mg/dL (ref 0.61–1.24)
GFR, Estimated: >60 mL/min (ref >60)
Glucose, Bld: 111 mg/dL — ABNORMAL HIGH (ref 70–99)
Potassium: 3.7 mmol/L (ref 3.5–5.1)
Sodium: 134 mmol/L — ABNORMAL LOW (ref 135–145)

## 2023-02-10 LAB — IRON AND TIBC
Iron: 152 ug/dL (ref 45–182)
Saturation Ratios: 36 % (ref 17.9–39.5)
TIBC: 428 ug/dL (ref 250–450)
UIBC: 276 ug/dL

## 2023-02-10 LAB — FERRITIN: Ferritin: 27 ng/mL (ref 24–336)

## 2023-02-11 LAB — KAPPA/LAMBDA LIGHT CHAINS
Kappa free light chain: 322.9 mg/L — ABNORMAL HIGH (ref 3.3–19.4)
Kappa, lambda light chain ratio: 57.66 — ABNORMAL HIGH (ref 0.26–1.65)
Lambda free light chains: 5.6 mg/L — ABNORMAL LOW (ref 5.7–26.3)

## 2023-02-12 DIAGNOSIS — G245 Blepharospasm: Secondary | ICD-10-CM | POA: Diagnosis not present

## 2023-02-14 LAB — MULTIPLE MYELOMA PANEL, SERUM
Albumin SerPl Elph-Mcnc: 4 g/dL (ref 2.9–4.4)
Albumin/Glob SerPl: 1.2 (ref 0.7–1.7)
Alpha 1: 0.1 g/dL (ref 0.0–0.4)
Alpha2 Glob SerPl Elph-Mcnc: 0.7 g/dL (ref 0.4–1.0)
B-Globulin SerPl Elph-Mcnc: 0.9 g/dL (ref 0.7–1.3)
Gamma Glob SerPl Elph-Mcnc: 1.8 g/dL (ref 0.4–1.8)
Globulin, Total: 3.5 g/dL (ref 2.2–3.9)
IgA: 66 mg/dL (ref 61–437)
IgG (Immunoglobin G), Serum: 2120 mg/dL — ABNORMAL HIGH (ref 603–1613)
IgM (Immunoglobulin M), Srm: 134 mg/dL (ref 15–143)
M Protein SerPl Elph-Mcnc: 1.7 g/dL — ABNORMAL HIGH
Total Protein ELP: 7.5 g/dL (ref 6.0–8.5)

## 2023-02-18 DIAGNOSIS — H60331 Swimmer's ear, right ear: Secondary | ICD-10-CM | POA: Diagnosis not present

## 2023-02-23 ENCOUNTER — Encounter: Payer: Self-pay | Admitting: Nurse Practitioner

## 2023-02-23 ENCOUNTER — Inpatient Hospital Stay: Payer: PPO | Attending: Internal Medicine | Admitting: Nurse Practitioner

## 2023-02-23 ENCOUNTER — Inpatient Hospital Stay: Payer: PPO

## 2023-02-23 ENCOUNTER — Other Ambulatory Visit: Payer: PPO

## 2023-02-23 VITALS — BP 126/63 | HR 77 | Temp 99.0°F | Wt 184.0 lb

## 2023-02-23 VITALS — BP 141/67 | HR 66

## 2023-02-23 DIAGNOSIS — D472 Monoclonal gammopathy: Secondary | ICD-10-CM | POA: Diagnosis not present

## 2023-02-23 DIAGNOSIS — D696 Thrombocytopenia, unspecified: Secondary | ICD-10-CM | POA: Insufficient documentation

## 2023-02-23 DIAGNOSIS — Z79899 Other long term (current) drug therapy: Secondary | ICD-10-CM | POA: Insufficient documentation

## 2023-02-23 DIAGNOSIS — D500 Iron deficiency anemia secondary to blood loss (chronic): Secondary | ICD-10-CM | POA: Diagnosis not present

## 2023-02-23 DIAGNOSIS — Z7901 Long term (current) use of anticoagulants: Secondary | ICD-10-CM | POA: Diagnosis not present

## 2023-02-23 DIAGNOSIS — Z86718 Personal history of other venous thrombosis and embolism: Secondary | ICD-10-CM | POA: Diagnosis not present

## 2023-02-23 DIAGNOSIS — D509 Iron deficiency anemia, unspecified: Secondary | ICD-10-CM | POA: Insufficient documentation

## 2023-02-23 DIAGNOSIS — E1151 Type 2 diabetes mellitus with diabetic peripheral angiopathy without gangrene: Secondary | ICD-10-CM | POA: Diagnosis not present

## 2023-02-23 DIAGNOSIS — D649 Anemia, unspecified: Secondary | ICD-10-CM

## 2023-02-23 MED ORDER — SODIUM CHLORIDE 0.9 % IV SOLN
200.0000 mg | Freq: Once | INTRAVENOUS | Status: DC
  Filled 2023-02-23: qty 10

## 2023-02-23 MED ORDER — IRON SUCROSE 20 MG/ML IV SOLN
200.0000 mg | Freq: Once | INTRAVENOUS | Status: AC
  Administered 2023-02-23: 200 mg via INTRAVENOUS

## 2023-02-23 NOTE — Progress Notes (Signed)
[facility] CONSULT NOTE  Patient Care Team: Marguarite Arbour, MD as PCP - General (Internal Medicine) Earna Coder, MD as Consulting Physician (Oncology) Kemper Durie, RN as [facility] Care Management  CHIEF COMPLAINTS/PURPOSE OF CONSULTATION: ANEMIA  HEMATOLOGY HISTORY:  # ANEMIA NORMOCYTIC  Vanessa Kick 2023- PCP-hemoglobin 8.7; saturation 7%; ferritin 23-LL-28] EGD/ colonoscopy-JAN 2023 [[facility]]; NO CT-A/P or capsule.   # MARCH 2023-possible MGUS; IgG kappa 1.6 g; kappa/lambda light chain ratio 33  March 2022- PE small volume right lower lobe; and right lower extremity above-knee DVT.  On Eliquis; OFF in March 2023; JUNE 15th-DVT-femoral/popliteal nonocclusive-started on Eliquis- INDEFINITE   # Hx of PVD [Dr.Dew]   Latest Reference Range & Units Most Recent 06/11/21 12:25 09/19/21 10:53  Kappa free light chain 3.3 - 19.4 mg/L 246.9 (H) 09/19/21 10:53 351.3 (H) 246.9 (H)  Lambda free light chains 5.7 - 26.3 mg/L 8.9 09/19/21 10:53 10.5 8.9  Kappa, lambda light chain ratio 0.26 - 1.65  27.74 (H) 09/19/21 10:53 33.46 (H) 27.74 (H)  (H): Data is abnormally high   HISTORY OF PRESENTING ILLNESS: Patient accompanied by his wife.  Ambulating independently.  Slightly hard of hearing.  Jonathon Barrera 82 y.o. male with iron deficient anemia of unclear etiology, history of recurrent DVT/PE, on eliquis, and MGUS who returns to clinic for consideration of IV iron. He continues oral iron. Energy has improved. He has black stools with oral iron but this is unchanged. Denies weight loss. No bone pain. No other complaints.   Review of Systems  Constitutional:  Positive for malaise/fatigue. Negative for chills, diaphoresis, fever and weight loss.  HENT:  Negative for nosebleeds and sore throat.   Eyes:  Negative for double vision.  Respiratory:  Negative for cough, hemoptysis, sputum production, shortness of breath and wheezing.   Cardiovascular:  Negative for chest  pain, palpitations, orthopnea and leg swelling.  Gastrointestinal:  Negative for abdominal pain, blood in stool, constipation, diarrhea, heartburn, melena, nausea and vomiting.  Genitourinary:  Negative for dysuria, frequency and urgency.  Musculoskeletal:  Positive for joint pain. Negative for back pain.  Skin: Negative.  Negative for itching and rash.  Neurological:  Negative for dizziness, tingling, focal weakness, weakness and headaches.  Endo/Heme/Allergies:  Does not bruise/bleed easily.  Psychiatric/Behavioral:  Negative for depression. The patient is not nervous/anxious and does not have insomnia.     MEDICAL HISTORY:  Past Medical History:  Diagnosis Date   Actinic keratosis    Acute deep vein thrombosis (DVT) of distal vein of right lower extremity (HCC) 06/19/2020   Aortic atherosclerosis (HCC)    Bladder carcinoma (HCC) 05/26/2007   a.) papillary-transitional cell   BPH (benign prostatic hyperplasia)    Colon polyps    COPD (chronic obstructive pulmonary disease) (HCC)    Current use of long term anticoagulation    a.) Apixaban   Diverticulosis    DOE (dyspnea on exertion)    History of SCC (squamous cell carcinoma) of skin 01/06/2023   left deltoid  ED&C done 01/06/2023   HOH (hard of hearing)    a.) uses BILATERAL assistive devices   Hyperlipemia    Hypertension    Mass    Lt lower cheek   Melanoma (HCC) 2000   Tx by Dr Orson Aloe   PAD (peripheral artery disease) [facility])    Peripheral vascular disease (HCC)    Pulmonary embolism (HCC) 06/19/2020   a.) RLL; no associated RIGHT heart strain   RBBB (right bundle branch  block)    Squamous cell carcinoma of skin 02/01/2017   Left dorsum latera. hand. EDC   Squamous cell carcinoma of skin 02/01/2017   Left dorsum base of thumb. EDC   T2DM (type 2 diabetes mellitus) (HCC)     SURGICAL HISTORY: Past Surgical History:  Procedure Laterality Date   back skin cancer     BLADDER SURGERY     CARDIAC CATHETERIZATION      CATARACT EXTRACTION W/PHACO Right 04/30/2015   Procedure: CATARACT EXTRACTION PHACO AND INTRAOCULAR LENS PLACEMENT (IOC);  Surgeon: Galen Manila, MD;  Location: [facility] ORS;  Service: Ophthalmology;  Laterality: Right;  [place] 01:03 AP% 21.4 CDE  13.57 fluid pack lot # [id number]   CATARACT EXTRACTION W/PHACO Left 05/21/2015   Procedure: CATARACT EXTRACTION PHACO AND INTRAOCULAR LENS PLACEMENT (IOC);  Surgeon: Galen Manila, MD;  Location: [facility] ORS;  Service: Ophthalmology;  Laterality: Left;  [place] 00:52 AP% 18.9 CDE 9.95 fluid pack lot #[id number]   COLONOSCOPY WITH PROPOFOL N/A 11/05/2014   Procedure: COLONOSCOPY WITH PROPOFOL;  Surgeon: Wallace Cullens, MD;  Location: [facility] ENDOSCOPY;  Service: Gastroenterology;  Laterality: N/A;   COLONOSCOPY WITH PROPOFOL N/A 05/19/2021   Procedure: COLONOSCOPY WITH PROPOFOL;  Surgeon: Jaynie Collins, DO;  Location: [facility] ENDOSCOPY;  Service: Gastroenterology;  Laterality: N/A;  DM Patient is hard of hearing and would like for wife to answer questions, please.   ENDARTERECTOMY FEMORAL Right 02/26/2021   Procedure: ENDARTERECTOMY FEMORAL (SFA STENT PLACEMEN);  Surgeon: Annice Needy, MD;  Location: [facility] ORS;  Service: Vascular;  Laterality: Right;   ESOPHAGOGASTRODUODENOSCOPY (EGD) WITH PROPOFOL N/A 05/19/2021   Procedure: ESOPHAGOGASTRODUODENOSCOPY (EGD) WITH PROPOFOL;  Surgeon: Jaynie Collins, DO;  Location: [facility] ENDOSCOPY;  Service: Gastroenterology;  Laterality: N/A;   HERNIA REPAIR     inguinal   KYPHOPLASTY N/A 02/23/2019   Procedure: T12 KYPHOPLASTY;  Surgeon: Kennedy Bucker, MD;  Location: [facility] ORS;  Service: Orthopedics;  Laterality: N/A;   LESION EXCISION N/A 02/23/2018   Procedure: EXCISION SCALP CYST AND FACIAL CYST;  Surgeon: Carolan Shiver, MD;  Location: [facility] ORS;  Service: General;  Laterality: N/A;   LOWER EXTREMITY ANGIOGRAPHY Right 01/23/2021   Procedure: LOWER EXTREMITY ANGIOGRAPHY;  Surgeon: Annice Needy, MD;  Location: [facility] INVASIVE  CV LAB;  Service: Cardiovascular;  Laterality: Right;   LOWER EXTREMITY ANGIOGRAPHY Right 01/12/2022   Procedure: Lower Extremity Angiography;  Surgeon: Annice Needy, MD;  Location: [facility] INVASIVE CV LAB;  Service: Cardiovascular;  Laterality: Right;   LOWER EXTREMITY ANGIOGRAPHY Right 01/26/2022   Procedure: Lower Extremity Angiography;  Surgeon: Annice Needy, MD;  Location: [facility] INVASIVE CV LAB;  Service: Cardiovascular;  Laterality: Right;   SHOULDER ARTHROSCOPY W/ ROTATOR CUFF REPAIR Right    TUR-BT      SOCIAL HISTORY: Social History   Socioeconomic History   Marital status: Married    Spouse name: Not on file   Number of children: Not on file   Years of education: Not on file   Highest education level: Not on file  Occupational History   Not on file  Tobacco Use   Smoking status: Every Day    Current packs/day: 0.50    Types: Cigarettes   Smokeless tobacco: Never  Vaping Use   Vaping status: Never Used  Substance and Sexual Activity   Alcohol use: Yes    Alcohol/week: 6.0 standard drinks of alcohol    Types: 6 Cans of beer per week   Drug use: No   Sexual activity:  Not on file  Other Topics Concern   Not on file  Social History Narrative   Lives in [place]; with wife. 2 children/near by. Smokes 1/2 ppd; 3-4 beers/weekend. Retired in 2010; Patent examiner.    Social Determinants of Health   Financial Resource Strain: Low Risk  (02/10/2023)   Received from [facility]   Overall Financial Resource Strain (CARDIA)    Difficulty of Paying Living Expenses: Not hard at all  Food Insecurity: No Food Insecurity (02/10/2023)   Received from [facility]   Hunger Vital Sign    Worried About Running Out of Food in the Last Year: Never true    Ran Out of Food in the Last Year: Never true  Transportation Needs: No Transportation Needs (02/10/2023)   Received from [facility] - Transportation     In the past 12 months, has lack of transportation kept you from medical appointments or from getting medications?: No    Lack of Transportation (Non-Medical): No  Physical Activity: Not on file  Stress: Not on file  Social Connections: Not on file  Intimate Partner Violence: Not on file    FAMILY HISTORY: Family History  Problem Relation Age of Onset   Diabetes Mother    Colon cancer Mother    Diabetes Father     ALLERGIES:  has No Known Allergies.   MEDICATIONS:  Current Outpatient Medications  Medication Sig Dispense Refill   apixaban (ELIQUIS) 5 MG TABS tablet Take 1 tablet (5 mg total) by mouth 2 (two) times daily. 180 tablet 1   Artificial Tear Solution (SOOTHE XP) SOLN Place 1 drop into both eyes 3 (three) times daily as needed (dry eyes).     ciprofloxacin-dexamethasone (CIPRODEX) OTIC suspension SMARTSIG:In Ear(s)     ferrous sulfate 325 (65 FE) MG tablet Take 325 mg by mouth 2 (two) times daily with a meal.     finasteride (PROSCAR) 5 MG tablet Take 5 mg by mouth daily.     glimepiride (AMARYL) 4 MG tablet Take 4 mg by mouth in the morning and at bedtime.     hydrochlorothiazide (HYDRODIURIL) 25 MG tablet Take 25 mg by mouth daily.     imipramine (TOFRANIL) 10 MG tablet Take by mouth.     Misc Natural Products (URINOZINC PLUS PO) Take 1 tablet by mouth 2 (two) times daily.      mometasone (ELOCON) 0.1 % cream 2 (two) times daily.     pioglitazone (ACTOS) 30 MG tablet Take by mouth.     telmisartan (MICARDIS) 80 MG tablet Take 80 mg by mouth daily.     No current facility-administered medications for this visit.    PHYSICAL EXAMINATION: Vitals:   02/23/23 1332  BP: 126/63  Pulse: 77  Temp: 99 F (37.2 C)  SpO2: 100%   Filed Weights   02/23/23 1332  Weight: 184 lb (83.5 kg)   Physical Exam Vitals reviewed.  Constitutional:      Appearance: He is not ill-appearing.  HENT:     Head: Normocephalic and atraumatic.     Ears:     Comments: Hearing  aid Cardiovascular:     Rate and Rhythm: Normal rate and regular rhythm.  Pulmonary:     Comments: Decreased breath sounds bilaterally.  Abdominal:     General: There is no distension.     Palpations: Abdomen is soft.     Tenderness: There is no abdominal tenderness.  Skin:    General: Skin is warm and dry.     Coloration: Skin is not pale.     Findings: No bruising.  Neurological:     Mental Status: He is alert and oriented to person, place, and time.  Psychiatric:        Mood and Affect: Mood normal.        Behavior: Behavior normal.     LABORATORY DATA:  I have reviewed the data as listed Lab Results  Component Value Date   WBC 4.0 02/10/2023   HGB 12.4 (L) 02/10/2023   HCT 37.4 (L) 02/10/2023   MCV 101.1 (H) 02/10/2023   PLT 152 02/10/2023   Recent Labs    08/11/22 0755 02/10/23 0818  NA 136 134*  K 3.5 3.7  CL 104 102  CO2 25 26  GLUCOSE 168* 111*  BUN 24* 21  CREATININE 0.83 0.97  CALCIUM 9.1 9.4  GFRNONAA >60 >60   Iron/TIBC/Ferritin/ %Sat    Component Value Date/Time   IRON 152 02/10/2023 0818   TIBC 428 02/10/2023 0818   FERRITIN 27 02/10/2023 0818   IRONPCTSAT 36 02/10/2023 0818   No results found for: "SPEP", "UPEP"  Component Ref Range & Units 13 d ago (02/10/23) 6 mo ago (08/11/22) 1 yr ago (01/29/22) 1 yr ago (09/19/21) 1 yr ago (06/11/21)  Kappa free light chain 3.3 - 19.4 mg/L 322.9 High  239.9 High  293.7 High  246.9 High  351.3 High   Lambda free light chains 5.7 - 26.3 mg/L 5.6 Low  9.2 8.9 8.9 10.5  Kappa, lambda light chain ratio 0.26 - 1.65 57.66 High  26.08 High  CM 33.00 High  CM 27.74 High  CM 33.46 High    No results found.  Assessment & Plan:  No problem-specific Assessment & Plan notes found for this encounter.  # Moderate to severe anemia/likely iron deficiency- hemoglobin 8; MCV normal; platelets normal [FEB 2023; PCP]-ferritin-16 [LN-24; iron saturation 7%]; normal white count/platelets.  S/p IV Venofer x 4, last  07/04/21. Hemoglobin 12.4. Ferritin has decreased to 27, iron sat 36. Proceed with venofer today. Continue gentle iron.   # MGUS:  ELEVATED M protein: MARCH 2023- IgG- 1.6 /dl; K/L= 33 [anemia-see above; no hypercalcemia no renal insufficiency; ? Bone lesions but no imaging performed]. April 2024- M protein 1.4; K/L Ratio 26. He has not had bone marrow biopsy to date. Today, M protein 1.7, K/L ratio increased to 58. Primarily drop in lambda free light chains but kappa free light chains are increased though similar to feb 2023. No evidence of renal dysfunction, hypercalcemia, and no new bone pain. He does have above anemia however. Reviewed that it may be justified to do bone marrow biopsy but I'll speak to Dr Donneta Romberg to confirm. I reviewed procedure including possible low risk of infection and pain. Recommend to be performed by IR. Patient in agreement.   # March 2022 [Dr.Sparks]- small volume PE/ RIGHT LE DVT- Positive for deep venous thrombosis in the right lower extremity [Above Knee- fem]; August 2022 repeat ultrasound- improved not resolved. OFF Eliquis [since March]. October 02, 2021 DVT- femoral/popliteal- on Eliquis 5 mg BID. Given recurrent DVT including prior history of PE, long term anticoagulation recommended or risk vs benefit discussion. No falls or apparent blood loss currently. Tolerating well. Continue eliquis.   # Intermittent thrombocytopenia - mild, intermittent. 152 today. Monitor while on eliquis.    # Hx o PVD [s/p stenting] - on eliquis- [Dr.Dew]  DISPOSITION: Venofer today 3 mo- lab (cbc, bmp, spep, kllc, ferritin, iron studies), Dr Donneta Romberg, +/- venofer- la  All questions were answered. The patient knows to call the clinic with any problems, questions or concerns.   Alinda Dooms, NP 02/23/2023   CC: Dr Donneta Romberg

## 2023-02-23 NOTE — Patient Instructions (Signed)
Iron Sucrose Injection What is this medication? IRON SUCROSE (EYE ern SOO krose) treats low levels of iron (iron deficiency anemia) in people with kidney disease. Iron is a mineral that plays an important role in making red blood cells, which carry oxygen from your lungs to the rest of your body. This medicine may be used for other purposes; ask your health care provider or pharmacist if you have questions. COMMON BRAND NAME(S): Venofer What should I tell my care team before I take this medication? They need to know if you have any of these conditions: Anemia not caused by low iron levels Heart disease High levels of iron in the blood Kidney disease Liver disease An unusual or allergic reaction to iron, other medications, foods, dyes, or preservatives Pregnant or trying to get pregnant Breastfeeding How should I use this medication? This medication is for infusion into a vein. It is given in a hospital or clinic setting. Talk to your care team about the use of this medication in children. While this medication may be prescribed for children as young as 2 years for selected conditions, precautions do apply. Overdosage: If you think you have taken too much of this medicine contact a poison control center or emergency room at once. NOTE: This medicine is only for you. Do not share this medicine with others. What if I miss a dose? Keep appointments for follow-up doses. It is important not to miss your dose. Call your care team if you are unable to keep an appointment. What may interact with this medication? Do not take this medication with any of the following: Deferoxamine Dimercaprol Other iron products This medication may also interact with the following: Chloramphenicol Deferasirox This list may not describe all possible interactions. Give your health care provider a list of all the medicines, herbs, non-prescription drugs, or dietary supplements you use. Also tell them if you smoke,  drink alcohol, or use illegal drugs. Some items may interact with your medicine. What should I watch for while using this medication? Visit your care team regularly. Tell your care team if your symptoms do not start to get better or if they get worse. You may need blood work done while you are taking this medication. You may need to follow a special diet. Talk to your care team. Foods that contain iron include: whole grains/cereals, dried fruits, beans, or peas, leafy green vegetables, and organ meats (liver, kidney). What side effects may I notice from receiving this medication? Side effects that you should report to your care team as soon as possible: Allergic reactions--skin rash, itching, hives, swelling of the face, lips, tongue, or throat Low blood pressure--dizziness, feeling faint or lightheaded, blurry vision Shortness of breath Side effects that usually do not require medical attention (report to your care team if they continue or are bothersome): Flushing Headache Joint pain Muscle pain Nausea Pain, redness, or irritation at injection site This list may not describe all possible side effects. Call your doctor for medical advice about side effects. You may report side effects to FDA at 1-800-FDA-1088. Where should I keep my medication? This medication is given in a hospital or clinic. It will not be stored at home. NOTE: This sheet is a summary. It may not cover all possible information. If you have questions about this medicine, talk to your doctor, pharmacist, or health care provider.  2024 Elsevier/Gold Standard (2022-09-11 00:00:00)

## 2023-03-04 DIAGNOSIS — H60331 Swimmer's ear, right ear: Secondary | ICD-10-CM | POA: Diagnosis not present

## 2023-03-12 ENCOUNTER — Telehealth: Payer: Self-pay | Admitting: *Deleted

## 2023-03-12 ENCOUNTER — Telehealth: Payer: Self-pay | Admitting: Nurse Practitioner

## 2023-03-12 DIAGNOSIS — D472 Monoclonal gammopathy: Secondary | ICD-10-CM

## 2023-03-12 DIAGNOSIS — D649 Anemia, unspecified: Secondary | ICD-10-CM

## 2023-03-12 NOTE — Telephone Encounter (Signed)
Spoke to patient and wife. Reviewed rationale for bone marrow again and recommendation from Dr. Donneta Romberg. Order placed. Again reviewed risk including infection, pain, bleeding. Patient will follow up with Dr. Donneta Romberg 2 weeks after for results.

## 2023-03-12 NOTE — Telephone Encounter (Signed)
Wife called stating that Jonathon Barrera was to have spoken with Dr B regarding this patient and they have not heard back from anyone about that, She is asking for  return call to know what is going on. Please return her call

## 2023-03-15 NOTE — Telephone Encounter (Signed)
Form was faxed on Friday 11/22, by Darrold Span. Marylu Lund is out office today. Will try tomorrow.

## 2023-03-16 NOTE — Telephone Encounter (Signed)
Patient scheduled for Tuesday 12/3 @ 8:30 am patient to arrive by 7:30 am. Patient verbalized understanding.  Britta Mccreedy, please schedule patient with Dr. B no labs for 2 weeks after BMB. Thanks

## 2023-03-22 ENCOUNTER — Other Ambulatory Visit (HOSPITAL_COMMUNITY): Payer: Self-pay | Admitting: Radiology

## 2023-03-22 DIAGNOSIS — D649 Anemia, unspecified: Secondary | ICD-10-CM

## 2023-03-22 NOTE — Progress Notes (Signed)
Patient for IR Bone Marrow Biopsy on Tues 03/23/2023, I called and spoke with the patient on the phone and gave pre-procedure instructions. Pt was made aware to be here at 7:30a, NPO after MN prior to procedure as well as driver post procedure/recovery/discharge. Pt stated understanding.  Called 03/22/2023

## 2023-03-23 ENCOUNTER — Ambulatory Visit
Admission: RE | Admit: 2023-03-23 | Discharge: 2023-03-23 | Disposition: A | Discharge status: Home / Self Care | Payer: PPO | Source: Ambulatory Visit | Attending: Nurse Practitioner | Admitting: Nurse Practitioner

## 2023-03-23 ENCOUNTER — Encounter: Payer: Self-pay | Admitting: Radiology

## 2023-03-23 ENCOUNTER — Other Ambulatory Visit: Payer: Self-pay

## 2023-03-23 DIAGNOSIS — C9000 Multiple myeloma not having achieved remission: Secondary | ICD-10-CM | POA: Insufficient documentation

## 2023-03-23 DIAGNOSIS — D472 Monoclonal gammopathy: Secondary | ICD-10-CM | POA: Insufficient documentation

## 2023-03-23 DIAGNOSIS — D649 Anemia, unspecified: Secondary | ICD-10-CM

## 2023-03-23 DIAGNOSIS — D61818 Other pancytopenia: Secondary | ICD-10-CM | POA: Diagnosis not present

## 2023-03-23 DIAGNOSIS — Z86718 Personal history of other venous thrombosis and embolism: Secondary | ICD-10-CM | POA: Diagnosis not present

## 2023-03-23 DIAGNOSIS — Z1379 Encounter for other screening for genetic and chromosomal anomalies: Secondary | ICD-10-CM | POA: Insufficient documentation

## 2023-03-23 DIAGNOSIS — D509 Iron deficiency anemia, unspecified: Secondary | ICD-10-CM | POA: Diagnosis not present

## 2023-03-23 DIAGNOSIS — C9010 Plasma cell leukemia not having achieved remission: Secondary | ICD-10-CM | POA: Diagnosis not present

## 2023-03-23 HISTORY — PX: IR BONE MARROW BIOPSY & ASPIRATION: IMG5727

## 2023-03-23 LAB — CBC WITH DIFFERENTIAL/PLATELET
Abs Immature Granulocytes: 0.03 10*3/uL (ref 0.00–0.07)
Basophils Absolute: 0.1 10*3/uL (ref 0.0–0.1)
Basophils Relative: 2 %
Eosinophils Absolute: 0.1 10*3/uL (ref 0.0–0.5)
Eosinophils Relative: 4 %
HCT: 34.3 % — ABNORMAL LOW (ref 39.0–52.0)
Hemoglobin: 11.4 g/dL — ABNORMAL LOW (ref 13.0–17.0)
Immature Granulocytes: 1 %
Lymphocytes Relative: 33 %
Lymphs Abs: 1.1 10*3/uL (ref 0.7–4.0)
MCH: 33.5 pg (ref 26.0–34.0)
MCHC: 33.2 g/dL (ref 30.0–36.0)
MCV: 100.9 fL — ABNORMAL HIGH (ref 80.0–100.0)
Monocytes Absolute: 0.4 10*3/uL (ref 0.1–1.0)
Monocytes Relative: 11 %
Neutro Abs: 1.6 10*3/uL — ABNORMAL LOW (ref 1.7–7.7)
Neutrophils Relative %: 49 %
Platelets: 120 10*3/uL — ABNORMAL LOW (ref 150–400)
RBC: 3.4 MIL/uL — ABNORMAL LOW (ref 4.22–5.81)
RDW: 13.9 % (ref 11.5–15.5)
WBC: 3.3 10*3/uL — ABNORMAL LOW (ref 4.0–10.5)
nRBC: 0 % (ref 0.0–0.2)

## 2023-03-23 LAB — GLUCOSE, CAPILLARY: Glucose-Capillary: 123 mg/dL — ABNORMAL HIGH (ref 70–99)

## 2023-03-23 MED ORDER — FENTANYL CITRATE (PF) 100 MCG/2ML IJ SOLN
INTRAMUSCULAR | Status: AC
  Filled 2023-03-23: qty 2

## 2023-03-23 MED ORDER — SODIUM CHLORIDE 0.9 % IV SOLN: INTRAVENOUS | Status: DC

## 2023-03-23 MED ORDER — LIDOCAINE 1 % OPTIME INJ - NO CHARGE
5.0000 mL | Freq: Once | INTRAMUSCULAR | Status: AC
  Administered 2023-03-23: 5 mL
  Filled 2023-03-23: qty 6

## 2023-03-23 MED ORDER — MIDAZOLAM HCL 2 MG/2ML IJ SOLN
INTRAMUSCULAR | Status: AC
Start: 2023-03-23 — End: ?
  Filled 2023-03-23: qty 2

## 2023-03-23 MED ORDER — MIDAZOLAM HCL 2 MG/2ML IJ SOLN
INTRAMUSCULAR | Status: AC | PRN
  Administered 2023-03-23: 1 mg via INTRAVENOUS

## 2023-03-23 MED ORDER — FENTANYL CITRATE (PF) 100 MCG/2ML IJ SOLN
INTRAMUSCULAR | Status: AC | PRN
  Administered 2023-03-23: 50 ug via INTRAVENOUS

## 2023-03-23 MED ORDER — HEPARIN SOD (PORK) LOCK FLUSH 100 UNIT/ML IV SOLN
INTRAVENOUS | Status: AC
  Filled 2023-03-23: qty 5

## 2023-03-23 NOTE — H&P (Signed)
Chief Complaint: MGUS/Anemia  Referring Provider(s): Consuello Masse  Supervising Physician: Gilmer Mor  Patient Status: [facility] - Out-pt  History of Present Illness: Jonathon Barrera is a 82 y.o. male with iron deficient anemia of unclear etiology, history of recurrent DVT/PE, on Eliquis, and MGUS.  HIs hemoglobin is 8.   MGUS diagnosed in March of 2023 - elevated M protein.  He is here today for bone marrow biopsy.  He is NPO. No nausea/vomiting. No Fever/chills. ROS negative.  Patient is Full Code  Past Medical History:  Diagnosis Date   Actinic keratosis    Acute deep vein thrombosis (DVT) of distal vein of right lower extremity (HCC) 06/19/2020   Aortic atherosclerosis (HCC)    Bladder carcinoma (HCC) 05/26/2007   a.) papillary-transitional cell   BPH (benign prostatic hyperplasia)    Colon polyps    COPD (chronic obstructive pulmonary disease) (HCC)    Current use of long term anticoagulation    a.) Apixaban   Diverticulosis    DOE (dyspnea on exertion)    History of SCC (squamous cell carcinoma) of skin 01/06/2023   left deltoid  ED&C done 01/06/2023   HOH (hard of hearing)    a.) uses BILATERAL assistive devices   Hyperlipemia    Hypertension    Mass    Lt lower cheek   Melanoma (HCC) 2000   Tx by Dr Orson Aloe   PAD (peripheral artery disease) [facility])    Peripheral vascular disease (HCC)    Pulmonary embolism (HCC) 06/19/2020   a.) RLL; no associated RIGHT heart strain   RBBB (right bundle branch block)    Squamous cell carcinoma of skin 02/01/2017   Left dorsum latera. hand. EDC   Squamous cell carcinoma of skin 02/01/2017   Left dorsum base of thumb. EDC   T2DM (type 2 diabetes mellitus) (HCC)     Past Surgical History:  Procedure Laterality Date   back skin cancer     BLADDER SURGERY     CARDIAC CATHETERIZATION     CATARACT EXTRACTION W/PHACO Right 04/30/2015   Procedure: CATARACT EXTRACTION PHACO AND INTRAOCULAR LENS PLACEMENT (IOC);   Surgeon: Galen Manila, MD;  Location: [facility] ORS;  Service: Ophthalmology;  Laterality: Right;  [place] 01:03 AP% 21.4 CDE  13.57 fluid pack lot # [id number]   CATARACT EXTRACTION W/PHACO Left 05/21/2015   Procedure: CATARACT EXTRACTION PHACO AND INTRAOCULAR LENS PLACEMENT (IOC);  Surgeon: Galen Manila, MD;  Location: [facility] ORS;  Service: Ophthalmology;  Laterality: Left;  [place] 00:52 AP% 18.9 CDE 9.95 fluid pack lot #[id number]   COLONOSCOPY WITH PROPOFOL N/A 11/05/2014   Procedure: COLONOSCOPY WITH PROPOFOL;  Surgeon: Wallace Cullens, MD;  Location: [facility] ENDOSCOPY;  Service: Gastroenterology;  Laterality: N/A;   COLONOSCOPY WITH PROPOFOL N/A 05/19/2021   Procedure: COLONOSCOPY WITH PROPOFOL;  Surgeon: Jaynie Collins, DO;  Location: [facility] ENDOSCOPY;  Service: Gastroenterology;  Laterality: N/A;  DM Patient is hard of hearing and would like for wife to answer questions, please.   ENDARTERECTOMY FEMORAL Right 02/26/2021   Procedure: ENDARTERECTOMY FEMORAL (SFA STENT PLACEMEN);  Surgeon: Annice Needy, MD;  Location: [facility] ORS;  Service: Vascular;  Laterality: Right;   ESOPHAGOGASTRODUODENOSCOPY (EGD) WITH PROPOFOL N/A 05/19/2021   Procedure: ESOPHAGOGASTRODUODENOSCOPY (EGD) WITH PROPOFOL;  Surgeon: Jaynie Collins, DO;  Location: [facility] ENDOSCOPY;  Service: Gastroenterology;  Laterality: N/A;   HERNIA REPAIR     inguinal   KYPHOPLASTY N/A 02/23/2019   Procedure: T12 KYPHOPLASTY;  Surgeon: Kennedy Bucker, MD;  Location: [facility] ORS;  Service: Orthopedics;  Laterality: N/A;   LESION EXCISION N/A 02/23/2018   Procedure: EXCISION SCALP CYST AND FACIAL CYST;  Surgeon: Carolan Shiver, MD;  Location: [facility] ORS;  Service: General;  Laterality: N/A;   LOWER EXTREMITY ANGIOGRAPHY Right 01/23/2021   Procedure: LOWER EXTREMITY ANGIOGRAPHY;  Surgeon: Annice Needy, MD;  Location: [facility] INVASIVE CV LAB;  Service: Cardiovascular;  Laterality: Right;   LOWER EXTREMITY ANGIOGRAPHY Right 01/12/2022   Procedure: Lower  Extremity Angiography;  Surgeon: Annice Needy, MD;  Location: [facility] INVASIVE CV LAB;  Service: Cardiovascular;  Laterality: Right;   LOWER EXTREMITY ANGIOGRAPHY Right 01/26/2022   Procedure: Lower Extremity Angiography;  Surgeon: Annice Needy, MD;  Location: [facility] INVASIVE CV LAB;  Service: Cardiovascular;  Laterality: Right;   SHOULDER ARTHROSCOPY W/ ROTATOR CUFF REPAIR Right    TUR-BT      Allergies: Patient has no known allergies.  Medications: Prior to Admission medications   Medication Sig Start Date End Date Taking? Authorizing Provider  apixaban (ELIQUIS) 5 MG TABS tablet Take 1 tablet (5 mg total) by mouth 2 (two) times daily. 07/27/22   Alinda Dooms, NP  Artificial Tear Solution (SOOTHE XP) SOLN Place 1 drop into both eyes 3 (three) times daily as needed (dry eyes).    [provider]  ciprofloxacin-dexamethasone (CIPRODEX) OTIC suspension SMARTSIG:In Ear(s) 02/18/23   [provider]  ferrous sulfate 325 (65 FE) MG tablet Take 325 mg by mouth 2 (two) times daily with a meal.    [provider]  finasteride (PROSCAR) 5 MG tablet Take 5 mg by mouth daily. 11/17/20   [provider]  glimepiride (AMARYL) 4 MG tablet Take 4 mg by mouth in the morning and at bedtime. 11/02/20   [provider]  hydrochlorothiazide (HYDRODIURIL) 25 MG tablet Take 25 mg by mouth daily.    [provider]  imipramine (TOFRANIL) 10 MG tablet Take by mouth. 02/18/23   [provider]  Misc Natural Products (URINOZINC PLUS PO) Take 1 tablet by mouth 2 (two) times daily.     [provider]  mometasone (ELOCON) 0.1 % cream 2 (two) times daily. 02/18/23   [provider]  pioglitazone (ACTOS) 30 MG tablet Take by mouth. 06/02/22 06/02/23  [provider]  telmisartan (MICARDIS) 80 MG tablet Take 80 mg by mouth daily. 12/16/18   [provider]     Family History  Problem Relation Age of Onset   Diabetes Mother     Colon cancer Mother    Diabetes Father     Social History   Socioeconomic History   Marital status: Married    Spouse name: Not on file   Number of children: Not on file   Years of education: Not on file   Highest education level: Not on file  Occupational History   Not on file  Tobacco Use   Smoking status: Every Day    Current packs/day: 0.50    Types: Cigarettes   Smokeless tobacco: Never  Vaping Use   Vaping status: Never Used  Substance and Sexual Activity   Alcohol use: Yes    Alcohol/week: 6.0 standard drinks of alcohol    Types: 6 Cans of beer per week   Drug use: No   Sexual activity: Not on file  Other Topics Concern   Not on file  Social History Narrative   Lives in [place]; with wife. 2 children/near by. Smokes 1/2 ppd; 3-4 beers/weekend.  Retired in 2010; Patent examiner.    Social Determinants of Health   Financial Resource Strain: Low Risk  (02/10/2023)   Received from [facility]   Overall Financial Resource Strain (CARDIA)    Difficulty of Paying Living Expenses: Not hard at all  Food Insecurity: No Food Insecurity (02/10/2023)   Received from [facility]   Hunger Vital Sign    Worried About Running Out of Food in the Last Year: Never true    Ran Out of Food in the Last Year: Never true  Transportation Needs: No Transportation Needs (02/10/2023)   Received from [facility] - Transportation    In the past 12 months, has lack of transportation kept you from medical appointments or from getting medications?: No    Lack of Transportation (Non-Medical): No  Physical Activity: Not on file  Stress: Not on file  Social Connections: Not on file     Review of Systems: A 12 point ROS discussed and pertinent positives are indicated in the HPI above.  All other systems are negative.  Review of Systems  Vital Signs: BP (!) 156/79   Pulse 70   Temp (!) 97.5 F (36.4 C)  (Oral)   Resp 15   Ht 5\' 9"  (1.753 m)   Wt 186 lb (84.4 kg)   SpO2 98%   BMI 27.47 kg/m   Advance Care Plan: The advanced care place/surrogate decision maker was discussed at the time of visit and the patient did not wish to discuss or was not able to name a surrogate decision maker or provide an advance care plan.  Physical Exam Vitals reviewed.  Constitutional:      Appearance: Normal appearance.  HENT:     Head: Normocephalic and atraumatic.  Eyes:     Extraocular Movements: Extraocular movements intact.  Cardiovascular:     Rate and Rhythm: Normal rate and regular rhythm.  Pulmonary:     Effort: Pulmonary effort is normal. No respiratory distress.     Breath sounds: Normal breath sounds.  Abdominal:     General: There is no distension.     Palpations: Abdomen is soft.     Tenderness: There is no abdominal tenderness.  Musculoskeletal:        General: Normal range of motion.     Cervical back: Normal range of motion.  Skin:    General: Skin is warm and dry.  Neurological:     General: No focal deficit present.     Mental Status: He is alert and oriented to person, place, and time.  Psychiatric:        Mood and Affect: Mood normal.        Behavior: Behavior normal.        Thought Content: Thought content normal.        Judgment: Judgment normal.     Imaging: No results found.  Labs:  CBC: Recent Labs    08/11/22 0755 02/10/23 0818  WBC 3.5* 4.0  HGB 11.5* 12.4*  HCT 34.6* 37.4*  PLT 150 152    COAGS: No results for input(s): "INR", "APTT" in the last 8760 hours.  BMP: Recent Labs    08/11/22 0755 02/10/23 0818  NA 136 134*  K 3.5 3.7  CL 104 102  CO2 25 26  GLUCOSE 168* 111*  BUN 24* 21  CALCIUM 9.1 9.4  CREATININE 0.83 0.97  GFRNONAA >60 >60    LIVER FUNCTION TESTS: No  results for input(s): "BILITOT", "AST", "ALT", "ALKPHOS", "PROT", "ALBUMIN" in the last 8760 hours.  TUMOR MARKERS: No results for input(s): "AFPTM", "CEA", "CA199",  "CHROMGRNA" in the last 8760 hours.  Assessment and Plan:  MGUS with anemia.  Will proceed with image guided bone marrow biopsy today by Dr. Loreta Ave.  Risks and benefits of bone marrow biopsy was discussed with the patient and/or patient's family including, but not limited to bleeding, infection, damage to adjacent structures or low yield requiring additional tests.  All of the questions were answered and there is agreement to proceed.  Consent signed and in chart.   Thank you for allowing our service to participate in CLEOPHIS PRENDES 's care.  Electronically Signed: Gwynneth Macleod, PA-C   03/23/2023, 8:34 AM      I spent a total of  30 Minutes  in face to face in clinical consultation, greater than 50% of which was counseling/coordinating care for bone marrow biopsy.

## 2023-03-23 NOTE — Procedures (Signed)
Interventional Radiology Procedure Note  Procedure:  Image guided aspirate and core biopsy of right posterior iliac bone Complications: None Recommendations: - Bedrest supine x 1 hrs - OTC's PRN  Pain - Follow biopsy results  Signed,  Dulcy Fanny. Earleen Newport, DO

## 2023-03-25 LAB — SURGICAL PATHOLOGY

## 2023-04-01 ENCOUNTER — Encounter (HOSPITAL_COMMUNITY): Payer: Self-pay | Admitting: Internal Medicine

## 2023-04-02 IMAGING — US US EXTREM LOW VENOUS*R*
1 series · 13 of 24 positions shown · non-contrast
Comparison: None.

CLINICAL DATA: Follow-up DVT



[Series 1: us extrem low venous*right* · 0.07mm/px · 13 of 37 slices shown]
[im 1/37]
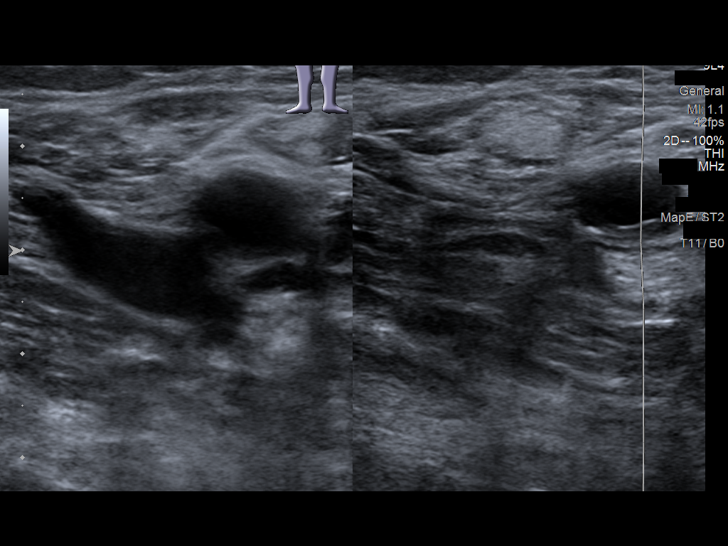
[im 4/37]
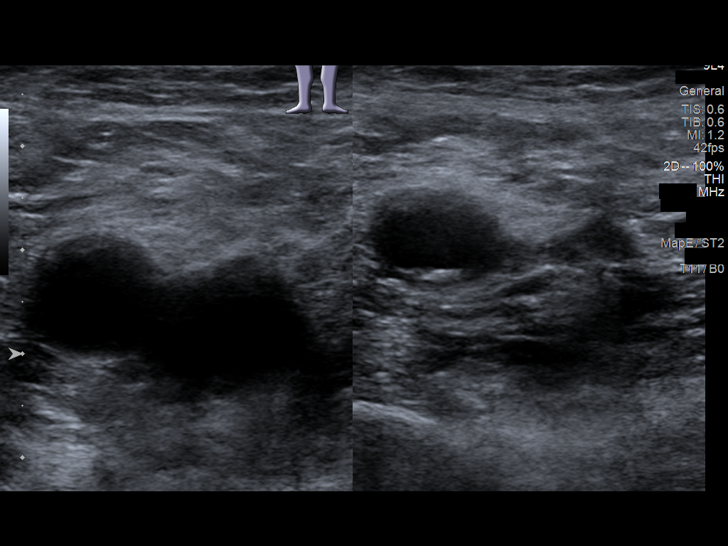
[im 7/37]
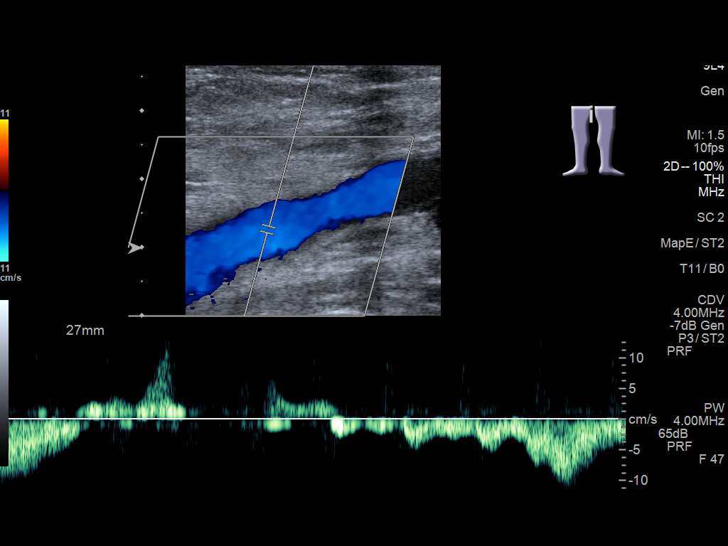
[im 10/37]
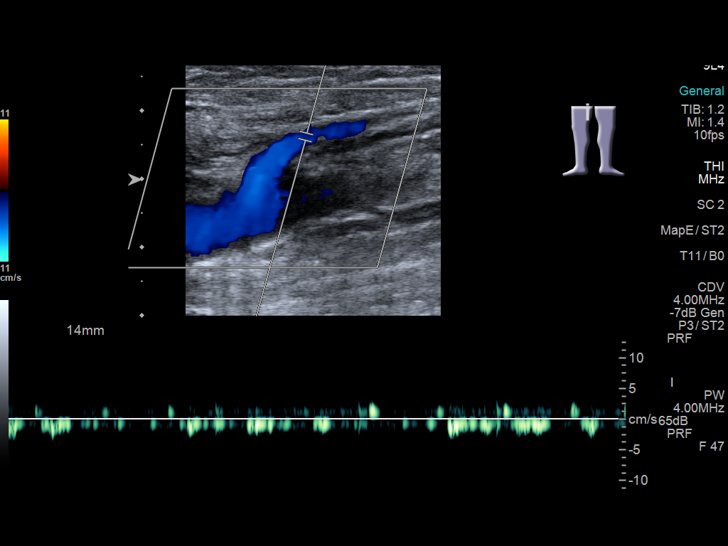
[im 13/37]
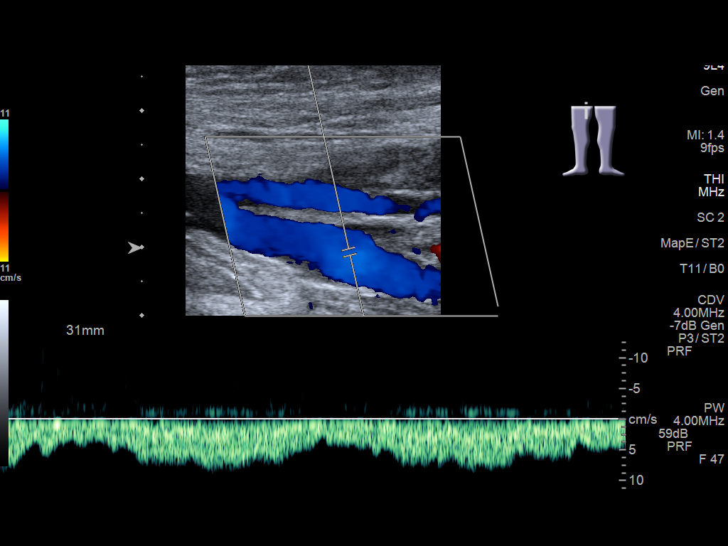
[im 16/37]
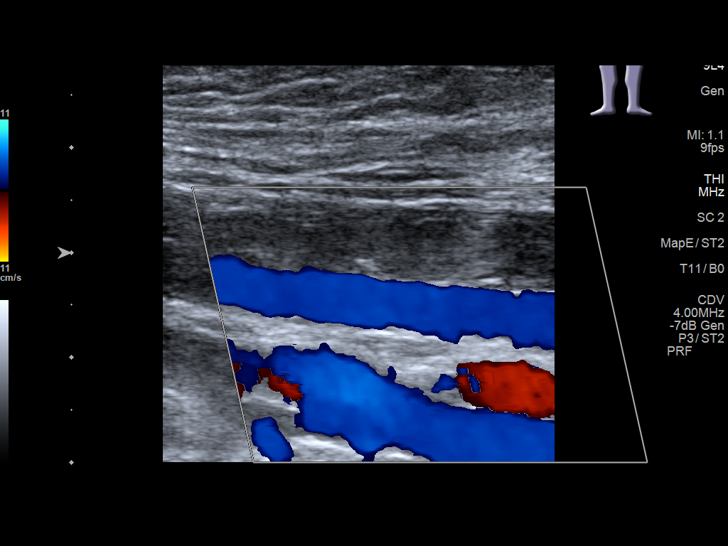
[im 19/37]
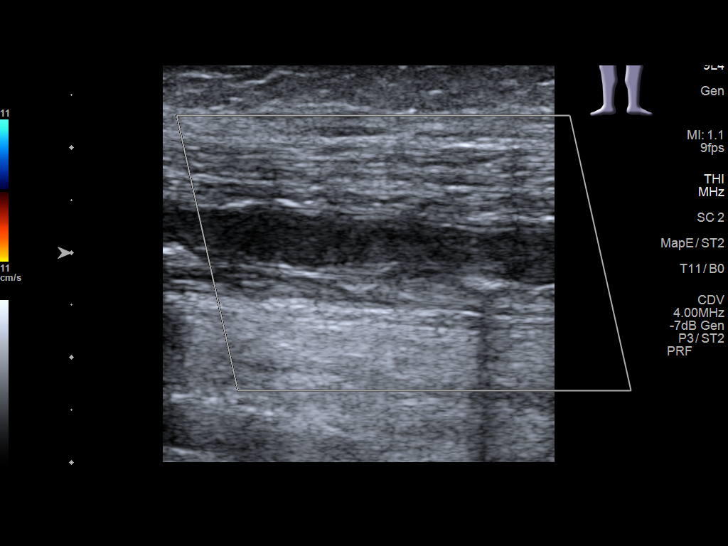
[im 21/37]
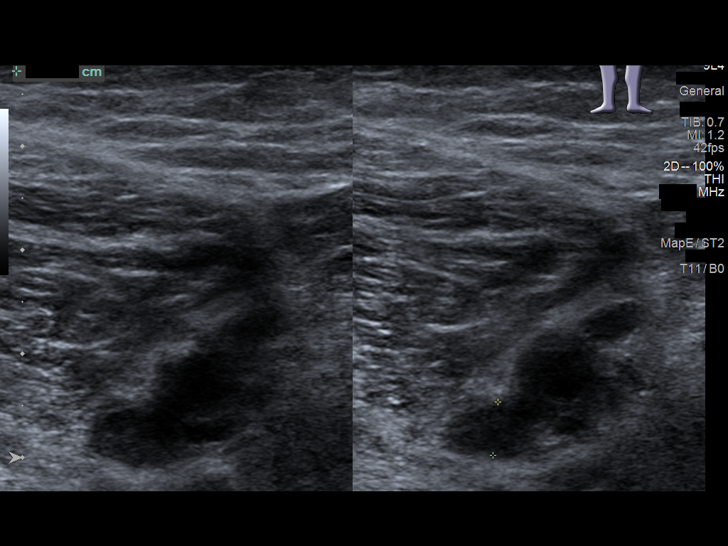
[im 24/37]
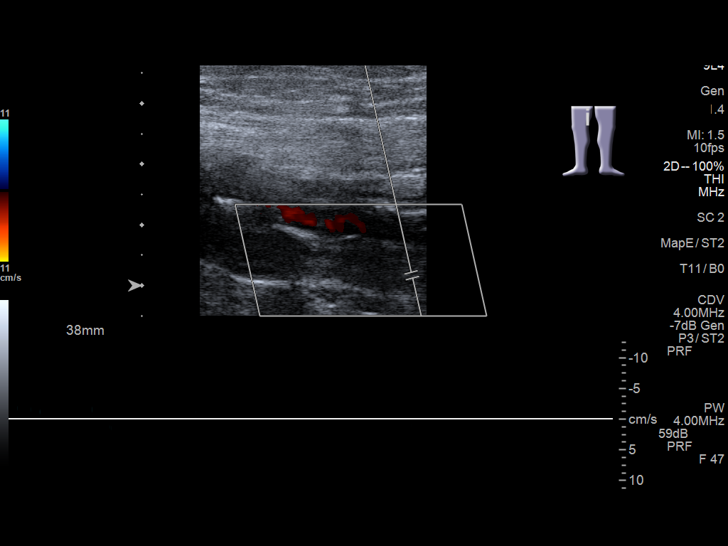
[im 27/37]
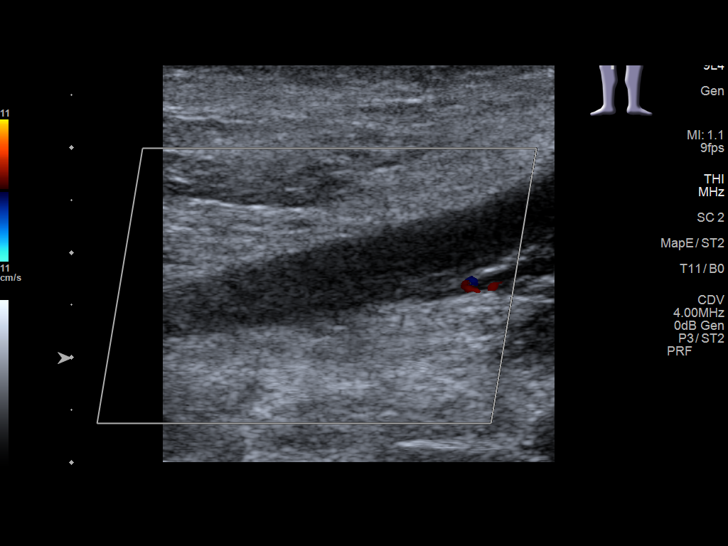
[im 30/37]
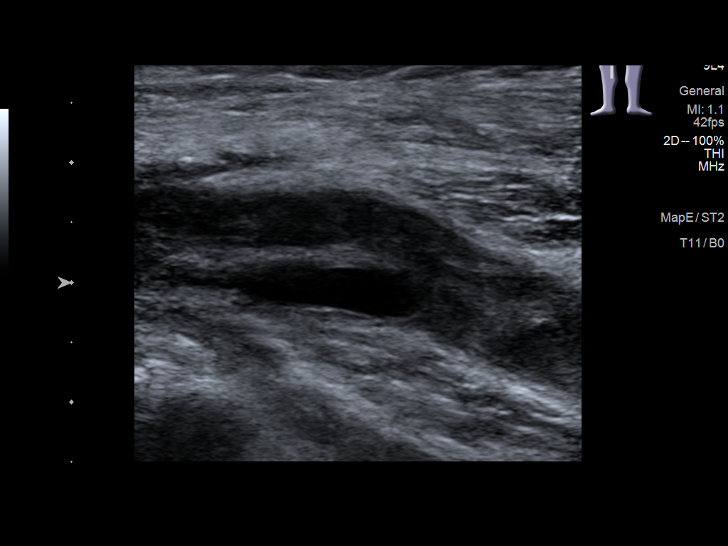
[im 33/37]
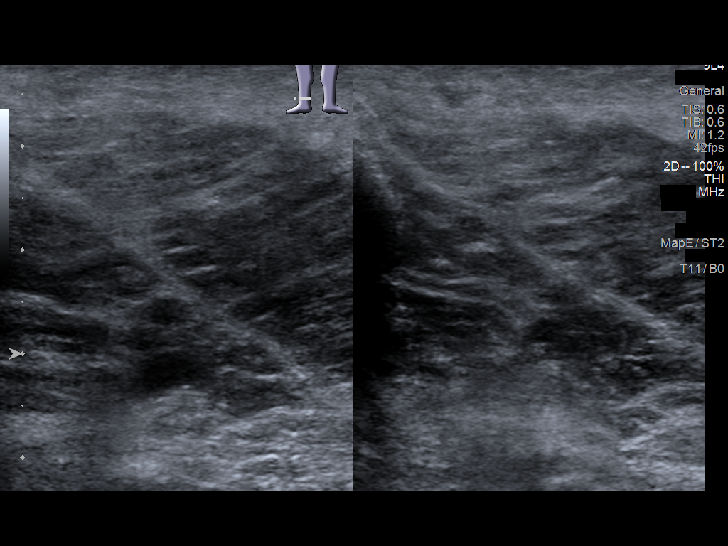
[im 37/37]
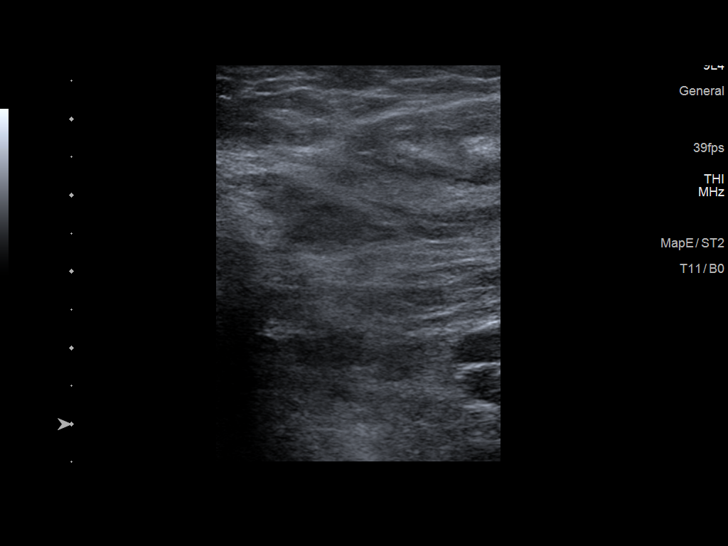

[13 of 24 positions shown; findings below may reference images not displayed]

FINDINGS: Contralateral Common Femoral Vein: Respiratory phasicity is normal
and symmetric with the symptomatic side. No evidence of thrombus.
Normal compressibility.

Common Femoral Vein: No evidence of thrombus. Normal
compressibility, respiratory phasicity and response to augmentation.

Saphenofemoral Junction: No evidence of thrombus. Normal
compressibility and flow on color Doppler imaging.

Profunda Femoral Vein: No evidence of thrombus. Normal
compressibility and flow on color Doppler imaging.

Femoral Vein: Interval recanalization of the most proximal femoral
vein in the upper thigh. However, the vessel remains filled with
occlusive thrombus in the mid thigh extending into the distal thigh.

Popliteal Vein: Occlusive thrombus extends into the popliteal vein.

Calf Veins: Occlusive thrombus extends into 1 of the paired peroneal
veins. The other peroneal vein has regain patency. Both tibial veins
have regained patency.

Superficial Great Saphenous Vein: No evidence of thrombus. Normal
compressibility.

Venous Reflux:  None.

Other Findings:  None.
IMPRESSION: Slight interval improvement of right lower extremity DVT with
restored patency in the femoral vein in the most proximal thigh,
within the posterior tibial veins in the calf and within 1 of the
paired peroneal veins in the calf.

Persistent occlusive thrombus in the femoral vein in the mid and
distal thigh, the popliteal vein and the other of the paired
peroneal veins.

## 2023-04-05 ENCOUNTER — Encounter (HOSPITAL_COMMUNITY): Payer: Self-pay | Admitting: Internal Medicine

## 2023-04-08 ENCOUNTER — Inpatient Hospital Stay: Payer: PPO | Attending: Internal Medicine | Admitting: Internal Medicine

## 2023-04-08 ENCOUNTER — Encounter: Payer: Self-pay | Admitting: Internal Medicine

## 2023-04-08 VITALS — BP 138/70 | HR 75 | Temp 97.8°F | Resp 18 | Wt 190.8 lb

## 2023-04-08 DIAGNOSIS — D696 Thrombocytopenia, unspecified: Secondary | ICD-10-CM | POA: Insufficient documentation

## 2023-04-08 DIAGNOSIS — F17210 Nicotine dependence, cigarettes, uncomplicated: Secondary | ICD-10-CM | POA: Diagnosis not present

## 2023-04-08 DIAGNOSIS — Z7901 Long term (current) use of anticoagulants: Secondary | ICD-10-CM | POA: Insufficient documentation

## 2023-04-08 DIAGNOSIS — Z79899 Other long term (current) drug therapy: Secondary | ICD-10-CM | POA: Insufficient documentation

## 2023-04-08 DIAGNOSIS — D509 Iron deficiency anemia, unspecified: Secondary | ICD-10-CM | POA: Diagnosis not present

## 2023-04-08 DIAGNOSIS — E8809 Other disorders of plasma-protein metabolism, not elsewhere classified: Secondary | ICD-10-CM | POA: Diagnosis not present

## 2023-04-08 DIAGNOSIS — D649 Anemia, unspecified: Secondary | ICD-10-CM | POA: Diagnosis not present

## 2023-04-08 DIAGNOSIS — Z86718 Personal history of other venous thrombosis and embolism: Secondary | ICD-10-CM | POA: Diagnosis not present

## 2023-04-08 DIAGNOSIS — Z86711 Personal history of pulmonary embolism: Secondary | ICD-10-CM | POA: Insufficient documentation

## 2023-04-08 DIAGNOSIS — C9000 Multiple myeloma not having achieved remission: Secondary | ICD-10-CM | POA: Diagnosis not present

## 2023-04-08 NOTE — Assessment & Plan Note (Signed)
  CCC; Sparks

## 2023-04-08 NOTE — Assessment & Plan Note (Addendum)
#   MULTIPLE MYELOMA: ELEVATED M protein:MARCH 2023- IgGK- 1.6 /dl; K/L= 33 [anemia-see above; no hypercalcemia no renal insufficiency; ?  Bone lesions-no x-rays done] OCT  2024- M protein- 1.7; K/L ratio: 57. DEC 12th, Bone marrow Biopsy:  The bone marrow is hypercellular for age with increased number of  atypical plasma cells representing 14%;  The background shows trilineage hematopoiesis with generally mild nonspecific changes.  Features  diagnostic of a myeloid neoplastic process are not present. Cytogenetic: FISH: Gain of 11/ q- standard risk.   # I reviewed the pathology with the patient and his wife in detail-discussed regarding diagnosis of multiple myeloma-given the mild anemia [see below]-without any evidence of any other endorgan damage-hypercalcemia or acute renal failure.  Recommend PET scan for further evaluation.  # Patient is not a candidate for stem cell transplant given his age/comorbidities.  Discussed options of treatment with pills/Revlimid; injections Velcade/ dara to help treat the disease which is unfortunately incurable.  However newer treatments have shown deep and long remissions.  However given lack of any significant signs or symptoms-I think is reasonable to continue surveillance; but again wait on the PET scan results.  # Mild anemia-history of iron deficiency-[October 2024-hemoglobin 11; iron saturation 36; ferritin-27]; currently secondary to plasma cell neoplasm.   # March 2022 [Dr.Sparks]-small volume PE/ RIGHT LE DVT- Positive for deep venous thrombosis in the right lower extremity [Above Knee- fem]; August 2022-Given recurrent DVT; prior history of PE--currently on indefinite anticoagulation.  # Intermittent thrombocytopenia isolated 134 mild monitor for now-while on Eliquis.   # Hx o PVD [s/p stenting]- on eliquis-n [Dr.Dew]  # I have encouraged them to bring the daughter to the next appointment.  I have also reached out to Dr. Judithann Sheen patient PCP regarding the new  diagnosis; and to discuss better control his blood sugars/in the context of the treatment of multiple myeloma.  # DISPOSITION: # Cancel previous FEB appts # follow up in 5 weeks- MD; labs- cbc/cmp; B12; beta-2 microglobulin; MM panel; K/L light chains; iron studies; ferritin-/possible venofer; PET scan prior--Dr.B  # 40 minutes face-to-face with the patient discussing the above plan of care; more than 50% of time spent on prognosis/ natural history; counseling and coordination.

## 2023-04-08 NOTE — Progress Notes (Signed)
[facility] CONSULT NOTE  Patient Care Team: Marguarite Arbour, MD as PCP - General (Internal Medicine) Earna Coder, MD as Consulting Physician (Oncology) Rodney Langton, RN as [facility] Care Management  CHIEF COMPLAINTS/PURPOSE OF CONSULTATION: ANEMIA  HEMATOLOGY HISTORY:  # ANEMIA NORMOCYTIC  Jonathon Barrera 2023- PCP-hemoglobin 8.7; saturation 7%; ferritin 23-LL-28]EGD/ colonoscopy-JAN 2023 [[facility]]; NO CT-A/P or capsule.   # MARCH 2023-possible MGUS; IgG kappa 1.6 g; kappa/lambda light chain ratio 33  March 2022-PE small volume right lower lobe; and right lower extremity above-knee DVT.  On Eliquis; OFF in March 2023; JUNE 15th-DVT-femoral/popliteal nonocclusive-started on Eliquis- INDEFINITE   # Hx of PVD [Dr.Dew]   Latest Reference Range & Units Most Recent 01/29/22 07:55 08/11/22 07:55 02/10/23 08:18  IgG (Immunoglobin G), Serum 603 - 1,613 mg/dL 4,132 (H) 44/01/02 72:53 1,975 (H) 2,058 (H) 2,120 (H)  IgM (Immunoglobulin M), Srm 15 - 143 mg/dL 664 40/34/74 25:95 638 (H) 156 (H) 134  IgA 61 - 437 mg/dL 66 75/64/33 29:51 75 81 66 she Not Please so [name]  Kappa free light chain 3.3 - 19.4 mg/L 322.9 (H) 02/10/23 08:18 293.7 (H) 239.9 (H) 322.9 (H)  Lambda free light chains 5.7 - 26.3 mg/L 5.6 (L) 02/10/23 08:18 8.9 9.2 5.6 (L)  Kappa, lambda light chain ratio 0.26 - 1.65  57.66 (H) 02/10/23 08:18 33.00 (H) 26.08 (H) 57.66 (H)  (H): Data is abnormally high (L): Data is abnormally low    HISTORY OF PRESENTING ILLNESS: Patient accompanied by his wife.  Ambulating independently.  Slightly hard of hearing.  Jonathon Barrera 82 y.o.  male iron deficient anemia-unclear etiology; history of DVT/PE-recurrent on eliquis; IV iron infusions for iron deficiency, and a history of MGUS is here to review the results of the bone marrow biopsy.  A bone marrow biopsy was done given his rising myeloma markers.  Bone  marrow biopsy procedure was uneventful.  Patient overall feels well over all. Appetite is good. Energy is okay. Dreams a lot at nigh . Continue for PO iron. No pain Patient noted to have improvement of his energy levels.  Review of Systems  Constitutional:  Positive for malaise/fatigue. Negative for chills, diaphoresis, fever and weight loss.  HENT:  Negative for nosebleeds and sore throat.   Eyes:  Negative for double vision.  Respiratory:  Negative for cough, hemoptysis, sputum production, shortness of breath and wheezing.   Cardiovascular:  Negative for chest pain, palpitations, orthopnea and leg swelling.  Gastrointestinal:  Negative for abdominal pain, blood in stool, constipation, diarrhea, heartburn, melena, nausea and vomiting.  Genitourinary:  Negative for dysuria, frequency and urgency.  Musculoskeletal:  Positive for joint pain. Negative for back pain.  Skin: Negative.  Negative for itching and rash.  Neurological:  Negative for dizziness, tingling, focal weakness, weakness and headaches.  Endo/Heme/Allergies:  Does not bruise/bleed easily.  Psychiatric/Behavioral:  Negative for depression. The patient is not nervous/anxious and does not have insomnia.     MEDICAL HISTORY:  Past Medical History:  Diagnosis Date   Actinic keratosis    Acute deep vein thrombosis (DVT) of distal vein of right lower extremity (HCC) 06/19/2020   Aortic atherosclerosis (HCC)    Bladder carcinoma (HCC) 05/26/2007   a.) papillary-transitional cell   BPH (benign prostatic hyperplasia)    Colon polyps    COPD (chronic obstructive pulmonary disease) (HCC)    Current use of long term anticoagulation    a.) Apixaban  Diverticulosis    DOE (dyspnea on exertion)    History of SCC (squamous cell carcinoma) of skin 01/06/2023   left deltoid  ED&C done 01/06/2023   HOH (hard of hearing)    a.) uses BILATERAL assistive devices   Hyperlipemia    Hypertension    Mass    Lt lower cheek   Melanoma (HCC)  2000   Tx by Dr Orson Aloe   PAD (peripheral artery disease) [facility])    Peripheral vascular disease (HCC)    Pulmonary embolism (HCC) 06/19/2020   a.) RLL; no associated RIGHT heart strain   RBBB (right bundle branch block)    Squamous cell carcinoma of skin 02/01/2017   Left dorsum latera. hand. EDC   Squamous cell carcinoma of skin 02/01/2017   Left dorsum base of thumb. EDC   T2DM (type 2 diabetes mellitus) (HCC)     SURGICAL HISTORY: Past Surgical History:  Procedure Laterality Date   back skin cancer     BLADDER SURGERY     CARDIAC CATHETERIZATION     CATARACT EXTRACTION W/PHACO Right 04/30/2015   Procedure: CATARACT EXTRACTION PHACO AND INTRAOCULAR LENS PLACEMENT (IOC);  Surgeon: Galen Manila, MD;  Location: [facility] ORS;  Service: Ophthalmology;  Laterality: Right;  [place] 01:03 AP% 21.4 CDE  13.57 fluid pack lot # [id number]   CATARACT EXTRACTION W/PHACO Left 05/21/2015   Procedure: CATARACT EXTRACTION PHACO AND INTRAOCULAR LENS PLACEMENT (IOC);  Surgeon: Galen Manila, MD;  Location: [facility] ORS;  Service: Ophthalmology;  Laterality: Left;  [place] 00:52 AP% 18.9 CDE 9.95 fluid pack lot #[id number]   COLONOSCOPY WITH PROPOFOL N/A 11/05/2014   Procedure: COLONOSCOPY WITH PROPOFOL;  Surgeon: Wallace Cullens, MD;  Location: [facility] ENDOSCOPY;  Service: Gastroenterology;  Laterality: N/A;   COLONOSCOPY WITH PROPOFOL N/A 05/19/2021   Procedure: COLONOSCOPY WITH PROPOFOL;  Surgeon: Jaynie Collins, DO;  Location: [facility] ENDOSCOPY;  Service: Gastroenterology;  Laterality: N/A;  DM Patient is hard of hearing and would like for wife to answer questions, please.   ENDARTERECTOMY FEMORAL Right 02/26/2021   Procedure: ENDARTERECTOMY FEMORAL (SFA STENT PLACEMEN);  Surgeon: Annice Needy, MD;  Location: [facility] ORS;  Service: Vascular;  Laterality: Right;   ESOPHAGOGASTRODUODENOSCOPY (EGD) WITH PROPOFOL N/A 05/19/2021   Procedure: ESOPHAGOGASTRODUODENOSCOPY (EGD) WITH PROPOFOL;  Surgeon: Jaynie Collins, DO;   Location: [facility] END[facility]COPY;  Service: Gastroenterology;  Laterality: N/A;   HERNIA REPAIR     inguinal   IR BONE MARROW BIOPSY & ASPIRATION  03/23/2023   KYPHOPLASTY N/A 02/23/2019   Procedure: T12 KYPHOPLASTY;  Surgeon: Kennedy Bucker, MD;  Location: [facility] ORS;  Service: Orthopedics;  Laterality: N/A;   LESION EXCISION N/A 02/23/2018   Procedure: EXCISION SCALP CYST AND FACIAL CYST;  Surgeon: Carolan Shiver, MD;  Location: [facility] ORS;  Service: General;  Laterality: N/A;   LOWER EXTREMITY ANGIOGRAPHY Right 01/23/2021   Procedure: LOWER EXTREMITY ANGIOGRAPHY;  Surgeon: Annice Needy, MD;  Location: [facility] INVASIVE CV LAB;  Service: Cardiovascular;  Laterality: Right;   LOWER EXTREMITY ANGIOGRAPHY Right 01/12/2022   Procedure: Lower Extremity Angiography;  Surgeon: Annice Needy, MD;  Location: [facility] INVASIVE CV LAB;  Service: Cardiovascular;  Laterality: Right;   LOWER EXTREMITY ANGIOGRAPHY Right 01/26/2022   Procedure: Lower Extremity Angiography;  Surgeon: Annice Needy, MD;  Location: [facility] INVASIVE CV LAB;  Service: Cardiovascular;  Laterality: Right;   SHOULDER ARTHROSCOPY W/ ROTATOR CUFF REPAIR Right    TUR-BT      SOCIAL HISTORY: Social History   Socioeconomic History  Marital status: Married    Spouse name: Not on file   Number of children: Not on file   Years of education: Not on file   Highest education level: Not on file  Occupational History   Not on file  Tobacco Use   Smoking status: Every Day    Current packs/day: 0.50    Types: Cigarettes   Smokeless tobacco: Never  Vaping Use   Vaping status: Never Used  Substance and Sexual Activity   Alcohol use: Yes    Alcohol/week: 6.0 standard drinks of alcohol    Types: 6 Cans of beer per week   Drug use: No   Sexual activity: Not on file  Other Topics Concern   Not on file  Social History Narrative   Lives in [place]; with wife. 2 children/near by. Smokes 1/2 ppd; 3-4 beers/weekend. Retired in 2010; Advertising account executive.    Social Drivers of Corporate investment banker Strain: Low Risk  (02/10/2023)   Received from [facility]   Overall Financial Resource Strain (CARDIA)    Difficulty of Paying Living Expenses: Not hard at all  Food Insecurity: No Food Insecurity (02/10/2023)   Received from [facility]   Hunger Vital Sign    Worried About Running Out of Food in the Last Year: Never true    Ran Out of Food in the Last Year: Never true  Transportation Needs: No Transportation Needs (02/10/2023)   Received from [facility] - Transportation    In the past 12 months, has lack of transportation kept you from medical appointments or from getting medications?: No    Lack of Transportation (Non-Medical): No  Physical Activity: Not on file  Stress: Not on file  Social Connections: Not on file  Intimate Partner Violence: Not on file    FAMILY HISTORY: Family History  Problem Relation Age of Onset   Diabetes Mother    Colon cancer Mother    Diabetes Father     ALLERGIES:  has no known allergies.  MEDICATIONS:  Current Outpatient Medications  Medication Sig Dispense Refill   apixaban (ELIQUIS) 5 MG TABS tablet Take 1 tablet (5 mg total) by mouth 2 (two) times daily. 180 tablet 1   Artificial Tear Solution (SOOTHE XP) SOLN Place 1 drop into both eyes 3 (three) times daily as needed (dry eyes).     ferrous sulfate 325 (65 FE) MG tablet Take 325 mg by mouth 2 (two) times daily with a meal.     finasteride (PROSCAR) 5 MG tablet Take 5 mg by mouth daily.     glimepiride (AMARYL) 4 MG tablet Take 4 mg by mouth in the morning and at bedtime.     hydrochlorothiazide (HYDRODIURIL) 25 MG tablet Take 25 mg by mouth daily.     Misc Natural Products (URINOZINC PLUS PO) Take 1 tablet by mouth 2 (two) times daily.      pioglitazone (ACTOS) 30 MG tablet Take by mouth.     telmisartan (MICARDIS) 80 MG tablet Take 80 mg by mouth daily.      No current facility-administered medications for this visit.      PHYSICAL EXAMINATION:   Vitals:   04/08/23 1002  BP: 138/70  Pulse: 75  Resp: 18  Temp: 97.8 F (36.6 C)  SpO2: 100%    Filed Weights   04/08/23 1002  Weight: 190 lb 12.8 oz (86.5 kg)     Physical  Exam Vitals and nursing note reviewed.  HENT:     Head: Normocephalic and atraumatic.     Mouth/Throat:     Pharynx: Oropharynx is clear.  Eyes:     Extraocular Movements: Extraocular movements intact.     Pupils: Pupils are equal, round, and reactive to light.  Cardiovascular:     Rate and Rhythm: Normal rate and regular rhythm.  Pulmonary:     Comments: Decreased breath sounds bilaterally.  Abdominal:     Palpations: Abdomen is soft.  Musculoskeletal:        General: Normal range of motion.     Cervical back: Normal range of motion.  Skin:    General: Skin is warm.  Neurological:     General: No focal deficit present.     Mental Status: He is alert and oriented to person, place, and time.  Psychiatric:        Behavior: Behavior normal.        Judgment: Judgment normal.     LABORATORY DATA:  I have reviewed the data as listed Lab Results  Component Value Date   WBC 3.3 (L) 03/23/2023   HGB 11.4 (L) 03/23/2023   HCT 34.3 (L) 03/23/2023   MCV 100.9 (H) 03/23/2023   PLT 120 (L) 03/23/2023   Recent Labs    08/11/22 0755 02/10/23 0818  NA 136 134*  K 3.5 3.7  CL 104 102  CO2 25 26  GLUCOSE 168* 111*  BUN 24* 21  CREATININE 0.83 0.97  CALCIUM 9.1 9.4  GFRNONAA >60 >60     IR BONE MARROW BIOPSY & ASPIRATION Result Date: 03/23/2023 INDICATION: 82 year old male with monoclonal gammopathy EXAM: IR BONE MARRO BIOPY AND ASPIRATION MEDICATIONS: None. ANESTHESIA/SEDATION: Moderate (conscious) sedation was employed during this procedure. A total of Versed 1.0 mg and Fentanyl 50 mcg was administered intravenously. Moderate Sedation Time: 10 minutes. The patient's level of consciousness and  vital signs were monitored continuously by radiology nursing throughout the procedure under my direct supervision. FLUOROSCOPY TIME:  Fluoroscopy Time:  (3 mGy). COMPLICATIONS: None PROCEDURE: Informed written consent was obtained from the patient after a thorough discussion of the procedural risks, benefits and alternatives. All questions were addressed. Maximal Sterile Barrier Technique was utilized including caps, mask, sterile gowns, sterile gloves, sterile drape, hand hygiene and skin antiseptic. A timeout was performed prior to the initiation of the procedure. Patient was positioned prone under the image intensifier. Physical exam was used to determine the L5- S1 level, and then the posterior pelvis was prepped with Chlorhexidine in a sterile fashion. A sterile drape was applied covering the operative field. Sterile gown/sterile gloves were used for the procedure. Local anesthesia was provided with 1% Lidocaine. Posterior right iliac bone was targeted for biopsy. The skin and subcutaneous tissues were infiltrated with 1% lidocaine without epinephrine. A small stab incision was made with an 11 blade scalpel, and an 11 gauge Murphy needle was advanced with fluoro guidance to the posterior cortex. Manual forced was used to advance the needle through the posterior cortex and the stylet was removed. A bone marrow aspirate was retrieved and passed to a cytotechnologist in the room. The Murphy needle was then advanced without the stylet for a core biopsy. The core biopsy was retrieved and also passed to a cytotechnologist. Manual pressure was used for hemostasis and a sterile dressing was placed. No complications were encountered no significant blood loss was encountered. Patient tolerated the procedure well and remained hemodynamically stable throughout. IMPRESSION: Status post  image guided bone marrow biopsy. Signed, Yvone Neu. Miachel Roux, RPVI [facility]  Radiology Electronically Signed   By: Gilmer Mor D.O.   On: 03/23/2023 10:01    Symptomatic anemia  CCC; [facility]   Plasma cell dyscrasia # MULTIPLE MYELOMA: ELEVATED M protein:MARCH 2023- IgGK- 1.6 /dl; K/L= 33 [anemia-see above; no hypercalcemia no renal insufficiency; ?  Bone lesions-no x-rays done] OCT  2024- M protein- 1.7; K/L ratio: 57. DEC 12th, Bone marrow Biopsy:  The bone marrow is hypercellular for age with increased number of  atypical plasma cells representing 14%;  The background shows trilineage hematopoiesis with generally mild nonspecific changes.  Features  diagnostic of a myeloid neoplastic process are not present. Cytogenetic: FISH: Gain of 11/ q- standard risk.   # I reviewed the pathology with the patient and his wife in detail-discussed regarding diagnosis of multiple myeloma-given the mild anemia [see below]-without any evidence of any other endorgan damage-hypercalcemia or acute renal failure.  Recommend PET scan for further evaluation.  # Patient is not a candidate for stem cell transplant given his age/comorbidities.  Discussed options of treatment with pills/Revlimid; injections Velcade/ dara to help treat the disease which is unfortunately incurable.  However newer treatments have shown deep and long remissions.  However given lack of any significant signs or symptoms-I think is reasonable to continue surveillance; but again wait on the PET scan results.  # Mild anemia-history of iron deficiency-[October 2024-hemoglobin 11; iron saturation 36; ferritin-27]; currently secondary to plasma cell neoplasm.   # March 2022 [Dr.Sparks]-small volume PE/ RIGHT LE DVT- Positive for deep venous thrombosis in the right lower extremity [Above Knee- fem]; August 2022-Given recurrent DVT; prior history of PE--currently on indefinite anticoagulation.  # Intermittent thrombocytopenia isolated 134 mild monitor for now-while on Eliquis.   # Hx o PVD [s/p stenting]- on eliquis-n  [Dr.Dew]  # I have encouraged them to bring the daughter to the next appointment.  I have also reached out to Dr. Judithann Sheen patient PCP regarding the new diagnosis; and to discuss better control his blood sugars/in the context of the treatment of multiple myeloma.  # DISPOSITION: # Cancel previous FEB appts # follow up in 5 weeks- MD; labs- cbc/cmp; B12; beta-2 microglobulin; MM panel; K/L light chains; iron studies; ferritin-/possible venofer; PET scan prior--Dr.B  # 40 minutes face-to-face with the patient discussing the above plan of care; more than 50% of time spent on prognosis/ natural history; counseling and coordination.    All questions were answered. The patient knows to call the clinic with any problems, questions or concerns.    Earna Coder, MD 04/08/2023 2:11 PM

## 2023-04-08 NOTE — Progress Notes (Signed)
Feels well over all. Appetite is good. Energy is okay. Dreams a lot at night.

## 2023-04-23 DIAGNOSIS — M25512 Pain in left shoulder: Secondary | ICD-10-CM | POA: Diagnosis not present

## 2023-05-10 ENCOUNTER — Ambulatory Visit
Admission: RE | Admit: 2023-05-10 | Discharge: 2023-05-10 | Disposition: A | Discharge status: Home / Self Care | Payer: PPO | Source: Ambulatory Visit | Attending: Internal Medicine | Admitting: Internal Medicine

## 2023-05-10 DIAGNOSIS — C9000 Multiple myeloma not having achieved remission: Secondary | ICD-10-CM | POA: Diagnosis not present

## 2023-05-10 DIAGNOSIS — E119 Type 2 diabetes mellitus without complications: Secondary | ICD-10-CM | POA: Diagnosis not present

## 2023-05-10 DIAGNOSIS — I2510 Atherosclerotic heart disease of native coronary artery without angina pectoris: Secondary | ICD-10-CM | POA: Diagnosis not present

## 2023-05-10 DIAGNOSIS — E8809 Other disorders of plasma-protein metabolism, not elsewhere classified: Secondary | ICD-10-CM | POA: Diagnosis not present

## 2023-05-10 DIAGNOSIS — I700 Atherosclerosis of aorta: Secondary | ICD-10-CM | POA: Diagnosis not present

## 2023-05-10 LAB — GLUCOSE, CAPILLARY: Glucose-Capillary: 147 mg/dL — ABNORMAL HIGH (ref 70–99)

## 2023-05-10 MED ORDER — FLUDEOXYGLUCOSE F - 18 (FDG) INJECTION
10.0400 | Freq: Once | INTRAVENOUS | Status: AC | PRN
  Administered 2023-05-10: 10.04 via INTRAVENOUS

## 2023-05-19 ENCOUNTER — Ambulatory Visit: Payer: PPO | Admitting: Internal Medicine

## 2023-05-19 ENCOUNTER — Other Ambulatory Visit: Payer: PPO

## 2023-05-25 ENCOUNTER — Encounter: Payer: Self-pay | Admitting: Internal Medicine

## 2023-05-25 ENCOUNTER — Inpatient Hospital Stay: Payer: PPO | Admitting: Internal Medicine

## 2023-05-25 ENCOUNTER — Inpatient Hospital Stay: Payer: PPO | Attending: Internal Medicine

## 2023-05-25 ENCOUNTER — Inpatient Hospital Stay: Payer: PPO

## 2023-05-25 VITALS — BP 131/67 | HR 76 | Temp 96.0°F | Resp 19

## 2023-05-25 DIAGNOSIS — D509 Iron deficiency anemia, unspecified: Secondary | ICD-10-CM | POA: Diagnosis not present

## 2023-05-25 DIAGNOSIS — E8809 Other disorders of plasma-protein metabolism, not elsewhere classified: Secondary | ICD-10-CM

## 2023-05-25 DIAGNOSIS — D696 Thrombocytopenia, unspecified: Secondary | ICD-10-CM | POA: Diagnosis not present

## 2023-05-25 DIAGNOSIS — D649 Anemia, unspecified: Secondary | ICD-10-CM

## 2023-05-25 DIAGNOSIS — Z79899 Other long term (current) drug therapy: Secondary | ICD-10-CM | POA: Insufficient documentation

## 2023-05-25 LAB — CBC WITH DIFFERENTIAL (CANCER CENTER ONLY)
Abs Immature Granulocytes: 0.06 10*3/uL (ref 0.00–0.07)
Basophils Absolute: 0.1 10*3/uL (ref 0.0–0.1)
Basophils Relative: 2 %
Eosinophils Absolute: 0.2 10*3/uL (ref 0.0–0.5)
Eosinophils Relative: 4 %
HCT: 32.6 % — ABNORMAL LOW (ref 39.0–52.0)
Hemoglobin: 10.6 g/dL — ABNORMAL LOW (ref 13.0–17.0)
Immature Granulocytes: 1 %
Lymphocytes Relative: 32 %
Lymphs Abs: 1.4 10*3/uL (ref 0.7–4.0)
MCH: 33.8 pg (ref 26.0–34.0)
MCHC: 32.5 g/dL (ref 30.0–36.0)
MCV: 103.8 fL — ABNORMAL HIGH (ref 80.0–100.0)
Monocytes Absolute: 0.4 10*3/uL (ref 0.1–1.0)
Monocytes Relative: 9 %
Neutro Abs: 2.2 10*3/uL (ref 1.7–7.7)
Neutrophils Relative %: 52 %
Platelet Count: 194 10*3/uL (ref 150–400)
RBC: 3.14 MIL/uL — ABNORMAL LOW (ref 4.22–5.81)
RDW: 14.7 % (ref 11.5–15.5)
WBC Count: 4.3 10*3/uL (ref 4.0–10.5)
nRBC: 0 % (ref 0.0–0.2)

## 2023-05-25 LAB — CMP (CANCER CENTER ONLY)
ALT: 23 U/L (ref 0–44)
AST: 21 U/L (ref 15–41)
Albumin: 4 g/dL (ref 3.5–5.0)
Alkaline Phosphatase: 63 U/L (ref 38–126)
Anion gap: 9 (ref 5–15)
BUN: 22 mg/dL (ref 8–23)
CO2: 25 mmol/L (ref 22–32)
Calcium: 9.3 mg/dL (ref 8.9–10.3)
Chloride: 102 mmol/L (ref 98–111)
Creatinine: 1.08 mg/dL (ref 0.61–1.24)
GFR, Estimated: >60 mL/min (ref >60)
Glucose, Bld: 204 mg/dL — ABNORMAL HIGH (ref 70–99)
Potassium: 3.6 mmol/L (ref 3.5–5.1)
Sodium: 136 mmol/L (ref 135–145)
Total Bilirubin: 0.7 mg/dL (ref 0.0–1.2)
Total Protein: 7.7 g/dL (ref 6.5–8.1)

## 2023-05-25 LAB — IRON AND TIBC
Iron: 109 ug/dL (ref 45–182)
Saturation Ratios: 27 % (ref 17.9–39.5)
TIBC: 399 ug/dL (ref 250–450)
UIBC: 290 ug/dL

## 2023-05-25 LAB — VITAMIN B12: Vitamin B-12: 668 pg/mL (ref 180–914)

## 2023-05-25 LAB — FERRITIN: Ferritin: 23 ng/mL — ABNORMAL LOW (ref 24–336)

## 2023-05-25 MED ORDER — SODIUM CHLORIDE 0.9% FLUSH
10.0000 mL | Freq: Once | INTRAVENOUS | Status: AC | PRN
  Administered 2023-05-25: 10 mL
  Filled 2023-05-25: qty 10

## 2023-05-25 MED ORDER — IRON SUCROSE 20 MG/ML IV SOLN
200.0000 mg | Freq: Once | INTRAVENOUS | Status: AC
  Administered 2023-05-25: 200 mg via INTRAVENOUS

## 2023-05-25 NOTE — Progress Notes (Signed)
 [facility] CONSULT NOTE  Patient Care Team: Jonathon Reyes BIRCH, MD as PCP - General (Internal Medicine) Rennie Jonathon SAUNDERS, MD as Consulting Physician (Oncology)  CHIEF COMPLAINTS/PURPOSE OF CONSULTATION: ANEMIA  HEMATOLOGY HISTORY:  # ANEMIA NORMOCYTIC  [JAN 2023- PCP-hemoglobin 8.7; saturation 7%; ferritin 23-LL-28]EGD/ colonoscopy-JAN 2023 [[facility]]; NO CT-A/P or capsule.   # MARCH 2023-possible MGUS; IgG kappa 1.6 g; kappa/lambda light chain ratio 33  March 2022-PE small volume right lower lobe; and right lower extremity above-knee DVT.  On Eliquis ; OFF in March 2023; JUNE 15th-DVT-femoral/popliteal nonocclusive-started on Eliquis - INDEFINITE   # Hx of PVD [Dr.Dew]   Latest Reference Range & Units Most Recent 01/29/22 07:55 08/11/22 07:55 02/10/23 08:18  IgG (Immunoglobin G), Serum 603 - 1,613 mg/dL 7,879 (H) 89/76/75 91:81 1,975 (H) 2,058 (H) 2,120 (H)  IgM (Immunoglobulin M), Srm 15 - 143 mg/dL 865 89/76/75 91:81 836 (H) 156 (H) 134  IgA 61 - 437 mg/dL 66 89/76/75 91:81 75 81 66 she Not Please so [name]  Kappa free light chain 3.3 - 19.4 mg/L 322.9 (H) 02/10/23 08:18 293.7 (H) 239.9 (H) 322.9 (H)  Lambda free light chains 5.7 - 26.3 mg/L 5.6 (L) 02/10/23 08:18 8.9 9.2 5.6 (L)  Kappa, lambda light chain ratio 0.26 - 1.65  57.66 (H) 02/10/23 08:18 33.00 (H) 26.08 (H) 57.66 (H)  (H): Data is abnormally high (L): Data is abnormally low    HISTORY OF PRESENTING ILLNESS: Patient accompanied by his wife.  Ambulating independently.  Slightly hard of hearing.  Jonathon Barrera 83 y.o.  male iron  deficient anemia-unclear etiology; history of DVT/PE-recurrent on eliquis ; IV iron  infusions for iron  deficiency, and a history of MGUS is here to review the results of the PET scan.   Patient overall feels well over all. Appetite is good. Energy is okay.  Continue for PO iron  BID. No pain. Denies any worsening energy  levels.  Review of Systems  Constitutional:  Positive for malaise/fatigue. Negative for chills, diaphoresis, fever and weight loss.  HENT:  Negative for nosebleeds and sore throat.   Eyes:  Negative for double vision.  Respiratory:  Negative for cough, hemoptysis, sputum production, shortness of breath and wheezing.   Cardiovascular:  Negative for chest pain, palpitations, orthopnea and leg swelling.  Gastrointestinal:  Negative for abdominal pain, blood in stool, constipation, diarrhea, heartburn, melena, nausea and vomiting.  Genitourinary:  Negative for dysuria, frequency and urgency.  Musculoskeletal:  Positive for joint pain. Negative for back pain.  Skin: Negative.  Negative for itching and rash.  Neurological:  Negative for dizziness, tingling, focal weakness, weakness and headaches.  Endo/Heme/Allergies:  Does not bruise/bleed easily.  Psychiatric/Behavioral:  Negative for depression. The patient is not nervous/anxious and does not have insomnia.     MEDICAL HISTORY:  Past Medical History:  Diagnosis Date   Actinic keratosis    Acute deep vein thrombosis (DVT) of distal vein of right lower extremity (HCC) 06/19/2020   Aortic atherosclerosis (HCC)    Bladder carcinoma (HCC) 05/26/2007   a.) papillary-transitional cell   BPH (benign prostatic hyperplasia)    Colon polyps    COPD (chronic obstructive pulmonary disease) (HCC)    Current use of long term anticoagulation    a.) Apixaban    Diverticulosis    DOE (dyspnea on exertion)    History of SCC (squamous cell carcinoma) of skin 01/06/2023   left deltoid  ED&C done 01/06/2023   HOH (hard of hearing)  a.) uses BILATERAL assistive devices   Hyperlipemia    Hypertension    Mass    Lt lower cheek   Melanoma (HCC) 2000   Tx by Dr Charlott   PAD (peripheral artery disease) [facility])    Peripheral vascular disease (HCC)    Pulmonary embolism (HCC) 06/19/2020   a.) RLL; no associated RIGHT heart strain   RBBB (right bundle  branch block)    Squamous cell carcinoma of skin 02/01/2017   Left dorsum latera. hand. EDC   Squamous cell carcinoma of skin 02/01/2017   Left dorsum base of thumb. EDC   T2DM (type 2 diabetes mellitus) (HCC)     SURGICAL HISTORY: Past Surgical History:  Procedure Laterality Date   back skin cancer     BLADDER SURGERY     CARDIAC CATHETERIZATION     CATARACT EXTRACTION W/PHACO Right 04/30/2015   Procedure: CATARACT EXTRACTION PHACO AND INTRAOCULAR LENS PLACEMENT (IOC);  Surgeon: Elsie Carmine, MD;  Location: [facility] ORS;  Service: Ophthalmology;  Laterality: Right;  US  01:03 AP% 21.4 CDE  13.57 fluid pack lot # [id number]   CATARACT EXTRACTION W/PHACO Left 05/21/2015   Procedure: CATARACT EXTRACTION PHACO AND INTRAOCULAR LENS PLACEMENT (IOC);  Surgeon: Elsie Carmine, MD;  Location: [facility] ORS;  Service: Ophthalmology;  Laterality: Left;  US  00:52 AP% 18.9 CDE 9.95 fluid pack lot #[id number]   COLONOSCOPY WITH PROPOFOL  N/A 11/05/2014   Procedure: COLONOSCOPY WITH PROPOFOL ;  Surgeon: Deward CINDERELLA Piedmont, MD;  Location: [facility] ENDOSCOPY;  Service: Gastroenterology;  Laterality: N/A;   COLONOSCOPY WITH PROPOFOL  N/A 05/19/2021   Procedure: COLONOSCOPY WITH PROPOFOL ;  Surgeon: Onita Elspeth Sharper, DO;  Location: [facility] ENDOSCOPY;  Service: Gastroenterology;  Laterality: N/A;  DM Patient is hard of hearing and would like for wife to answer questions, please.   ENDARTERECTOMY FEMORAL Right 02/26/2021   Procedure: ENDARTERECTOMY FEMORAL (SFA STENT PLACEMEN);  Surgeon: Marea Selinda RAMAN, MD;  Location: [facility] ORS;  Service: Vascular;  Laterality: Right;   ESOPHAGOGASTRODUODENOSCOPY (EGD) WITH PROPOFOL  N/A 05/19/2021   Procedure: ESOPHAGOGASTRODUODENOSCOPY (EGD) WITH PROPOFOL ;  Surgeon: Onita Elspeth Sharper, DO;  Location: [facility] ENDOSCOPY;  Service: Gastroenterology;  Laterality: N/A;   HERNIA REPAIR     inguinal   IR BONE MARROW BIOPSY & ASPIRATION  03/23/2023   KYPHOPLASTY N/A 02/23/2019   Procedure: T12 KYPHOPLASTY;   Surgeon: Kathlynn Sharper, MD;  Location: [facility] ORS;  Service: Orthopedics;  Laterality: N/A;   LESION EXCISION N/A 02/23/2018   Procedure: EXCISION SCALP CYST AND FACIAL CYST;  Surgeon: Rodolph Romano, MD;  Location: [facility] ORS;  Service: General;  Laterality: N/A;   LOWER EXTREMITY ANGIOGRAPHY Right 01/23/2021   Procedure: LOWER EXTREMITY ANGIOGRAPHY;  Surgeon: Marea Selinda RAMAN, MD;  Location: [facility] INVASIVE CV LAB;  Service: Cardiovascular;  Laterality: Right;   LOWER EXTREMITY ANGIOGRAPHY Right 01/12/2022   Procedure: Lower Extremity Angiography;  Surgeon: Marea Selinda RAMAN, MD;  Location: [facility] INVASIVE CV LAB;  Service: Cardiovascular;  Laterality: Right;   LOWER EXTREMITY ANGIOGRAPHY Right 01/26/2022   Procedure: Lower Extremity Angiography;  Surgeon: Marea Selinda RAMAN, MD;  Location: [facility] INVASIVE CV LAB;  Service: Cardiovascular;  Laterality: Right;   SHOULDER ARTHROSCOPY W/ ROTATOR CUFF REPAIR Right    TUR-BT      SOCIAL HISTORY: Social History   Socioeconomic History   Marital status: Married    Spouse name: Not on file   Number of children: Not on file   Years of education: Not on file   Highest education level: Not on file  Occupational History   Not on file  Tobacco Use   Smoking status: Every Day    Current packs/day: 0.50    Types: Cigarettes   Smokeless tobacco: Never  Vaping Use   Vaping status: Never Used  Substance and Sexual Activity   Alcohol  use: Yes    Alcohol /week: 6.0 standard drinks of alcohol     Types: 6 Cans of beer per week   Drug use: No   Sexual activity: Not on file  Other Topics Concern   Not on file  Social History Narrative   Lives in [place]; with wife. 2 children/near by. Smokes 1/2 ppd; 3-4 beers/weekend. Retired in 2010; patent examiner.    Social Drivers of Corporate Investment Banker Strain: Low Risk  (02/10/2023)   Received from [facility]   Overall Financial Resource Strain (CARDIA)    Difficulty of Paying  Living Expenses: Not hard at all  Food Insecurity: No Food Insecurity (02/10/2023)   Received from [facility]   Hunger Vital Sign    Worried About Running Out of Food in the Last Year: Never true    Ran Out of Food in the Last Year: Never true  Transportation Needs: No Transportation Needs (02/10/2023)   Received from [facility] - Transportation    In the past 12 months, has lack of transportation kept you from medical appointments or from getting medications?: No    Lack of Transportation (Non-Medical): No  Physical Activity: Not on file  Stress: Not on file  Social Connections: Not on file  Intimate Partner Violence: Not on file    FAMILY HISTORY: Family History  Problem Relation Age of Onset   Diabetes Mother    Colon cancer Mother    Diabetes Father     ALLERGIES:  has no known allergies.  MEDICATIONS:  Current Outpatient Medications  Medication Sig Dispense Refill   apixaban  (ELIQUIS ) 5 MG TABS tablet Take 1 tablet (5 mg total) by mouth 2 (two) times daily. 180 tablet 1   Artificial Tear Solution (SOOTHE XP) SOLN Place 1 drop into both eyes 3 (three) times daily as needed (dry eyes).     ascorbic acid (VITAMIN C) 500 MG tablet Take 500 mg by mouth daily.     ferrous sulfate 325 (65 FE) MG tablet Take 325 mg by mouth 2 (two) times daily with a meal.     finasteride  (PROSCAR ) 5 MG tablet Take 5 mg by mouth daily.     glimepiride (AMARYL) 4 MG tablet Take 4 mg by mouth in the morning and at bedtime.     hydrochlorothiazide  (HYDRODIURIL ) 25 MG tablet Take 25 mg by mouth daily.     Misc Natural Products (URINOZINC PLUS PO) Take 1 tablet by mouth 2 (two) times daily.      pioglitazone (ACTOS) 30 MG tablet Take by mouth.     telmisartan (MICARDIS) 80 MG tablet Take 80 mg by mouth daily.     No current facility-administered medications for this visit.      PHYSICAL EXAMINATION:   Vitals:   05/25/23 1446  BP: 139/83   Pulse: 80  Temp: 98.2 F (36.8 C)  SpO2: 99%    Filed Weights   05/25/23 1446  Weight: 194 lb (88 kg)     Physical Exam Vitals and nursing note reviewed.  HENT:     Head: Normocephalic and atraumatic.     Mouth/Throat:  Pharynx: Oropharynx is clear.  Eyes:     Extraocular Movements: Extraocular movements intact.     Pupils: Pupils are equal, round, and reactive to light.  Cardiovascular:     Rate and Rhythm: Normal rate and regular rhythm.  Pulmonary:     Comments: Decreased breath sounds bilaterally.  Abdominal:     Palpations: Abdomen is soft.  Musculoskeletal:        General: Normal range of motion.     Cervical back: Normal range of motion.  Skin:    General: Skin is warm.  Neurological:     General: No focal deficit present.     Mental Status: He is alert and oriented to person, place, and time.  Psychiatric:        Behavior: Behavior normal.        Judgment: Judgment normal.     LABORATORY DATA:  I have reviewed the data as listed Lab Results  Component Value Date   WBC 4.3 05/25/2023   HGB 10.6 (L) 05/25/2023   HCT 32.6 (L) 05/25/2023   MCV 103.8 (H) 05/25/2023   PLT 194 05/25/2023   Recent Labs    08/11/22 0755 02/10/23 0818 05/25/23 1432  NA 136 134* 136  K 3.5 3.7 3.6  CL 104 102 102  CO2 25 26 25   GLUCOSE 168* 111* 204*  BUN 24* 21 22  CREATININE 0.83 0.97 1.08  CALCIUM 9.1 9.4 9.3  GFRNONAA >60 >60 >60  PROT  --   --  7.7  ALBUMIN  --   --  4.0  AST  --   --  21  ALT  --   --  23  ALKPHOS  --   --  63  BILITOT  --   --  0.7     NM PET Image Initial (PI) Whole Body (F-18 FDG) Result Date: 05/17/2023 CLINICAL DATA:  Initial treatment strategy for multiple myeloma. EXAM: NUCLEAR MEDICINE PET WHOLE BODY TECHNIQUE: 10.0 mCi F-18 FDG was injected intravenously. Full-ring PET imaging was performed from the head to foot after the radiotracer. CT data was obtained and used for attenuation correction and anatomic localization. Fasting  blood glucose: 147 mg/dl COMPARISON:  CT chest 96/97/7977. FINDINGS: Mediastinal blood pool activity: SUV max 3.2 HEAD/NECK: No abnormal hypermetabolism. Incidental CT findings: None. CHEST: No abnormal hypermetabolism. Incidental CT findings: Atherosclerotic calcification of the aorta, aortic valve and coronary arteries. Heart is enlarged. No pericardial or pleural effusion. ABDOMEN/PELVIS: Hypermetabolism associated with a right inguinal hernia repair. No abnormal hypermetabolism. Incidental CT findings: Tiny hepatic cyst. Slight nodular thickening of the right adrenal gland. No specific follow-up necessary. Small low-attenuation lesion in the left kidney. No specific follow-up necessary. Small bilateral inguinal hernias contain fat. SKELETON: No abnormal hypermetabolism. Incidental CT findings: T12 vertebral body augmentation.  Degenerative changes in the spine. EXTREMITIES: No abnormal hypermetabolism. Incidental CT findings: None. IMPRESSION: 1. No evidence of metabolically active multiple myeloma. 2. Aortic atherosclerosis (ICD10-I70.0). Coronary artery calcification. Electronically Signed   By: Newell Eke M.D.   On: 05/17/2023 10:27    Plasma cell dyscrasia # MULTIPLE MYELOMA: ELEVATED M protein:MARCH 2023- IgGK- 1.6 /dl; K/L= 33 [anemia-see above; no hypercalcemia no renal insufficiency; ?  Bone lesions-no x-rays done] OCT  2024- M protein- 1.7; K/L ratio: 57. DEC 12th, Bone marrow Biopsy:  The bone marrow is hypercellular for age with increased number of atypical plasma cells representing 14%;  The background shows trilineage hematopoiesis with generally mild nonspecific changes.  Features  diagnostic  of a myeloid neoplastic process are not present. Cytogenetic: FISH: Gain of 11/ q- standard risk. JAN 2025- PET scan- negative for any bone lesions.   # Consistent with diagnosis of multiple myeloma-given the mild - moderate anemia [see below]-without any evidence of any other endorgan  damage-hypercalcemia or acute renal failure or bone lesions on PET scan.   # Patient is not a candidate for stem cell transplant given his age/comorbidities.  Discussed options of treatment with pills/Revlimid; injections Velcade/ dara to help treat the disease which is unfortunately incurable.  However newer treatments have shown deep and long remissions.  Discussed that if anemia does not improve status post infusion-or any other endorgan dysfunction noted would recommend starting chemotherapy.  # Mild- moderate anemia-multifactorial  iron  deficiency/multiple myeloma-[October 2024-hemoglobin 11; iron  saturation 36; ferritin-27];-  Proceed with venofer  today; and again in 1 week.   # March 2022 [Dr.Sparks]-small volume PE/ RIGHT LE DVT- Positive for deep venous thrombosis in the right lower extremity [Above Knee- fem]; August 2022-Given recurrent DVT; prior history of PE--currently on indefinite anticoagulation.  # Intermittent thrombocytopenia isolated 134 mild monitor for now-while on Eliquis .   # Hx o PVD [s/p stenting]- on eliquis -n [Dr.Dew]  # DISPOSITION: #  IV venofer  today  # Venofer  in 1 week  # follow up in  2 months- MD; labs- cbc/cmp; B12; MM panel; K/L light chains; iron  studies; ferritin-/possible venofer ;  Dr.B     All questions were answered. The patient knows to call the clinic with any problems, questions or concerns.    Jonathon JONELLE Joe, MD 05/26/2023 1:01 PM

## 2023-05-25 NOTE — Assessment & Plan Note (Addendum)
#   MULTIPLE MYELOMA: ELEVATED M protein:MARCH 2023- IgGK- 1.6 /dl; K/L= 33 [anemia-see above; no hypercalcemia no renal insufficiency; ?  Bone lesions-no x-rays done] OCT  2024- M protein- 1.7; K/L ratio: 57. DEC 12th, Bone marrow Biopsy:  The bone marrow is hypercellular for age with increased number of atypical plasma cells representing 14%;  The background shows trilineage hematopoiesis with generally mild nonspecific changes.  Features  diagnostic of a myeloid neoplastic process are not present. Cytogenetic: FISH: Gain of 11/ q- standard risk. JAN 2025- PET scan- negative for any bone lesions.   # Consistent with diagnosis of multiple myeloma-given the mild - moderate anemia [see below]-without any evidence of any other endorgan damage-hypercalcemia or acute renal failure or bone lesions on PET scan.   # Patient is not a candidate for stem cell transplant given his age/comorbidities.  Discussed options of treatment with pills/Revlimid; injections Velcade/ dara to help treat the disease which is unfortunately incurable.  However newer treatments have shown deep and long remissions.  Discussed that if anemia does not improve status post infusion-or any other endorgan dysfunction noted would recommend starting chemotherapy.  # Mild- moderate anemia-multifactorial  iron  deficiency/multiple myeloma-[October 2024-hemoglobin 11; iron  saturation 36; ferritin-27];-  Proceed with venofer  today; and again in 1 week.   # March 2022 [Dr.Sparks]-small volume PE/ RIGHT LE DVT- Positive for deep venous thrombosis in the right lower extremity [Above Knee- fem]; August 2022-Given recurrent DVT; prior history of PE--currently on indefinite anticoagulation.  # Intermittent thrombocytopenia isolated 134 mild monitor for now-while on Eliquis .   # Hx o PVD [s/p stenting]- on eliquis -n [Dr.Dew]  # DISPOSITION: #  IV venofer  today  # Venofer  in 1 week  # follow up in  2 months- MD; labs- cbc/cmp; B12; MM panel; K/L  light chains; iron  studies; ferritin-/possible venofer ;  Dr.B

## 2023-05-25 NOTE — Progress Notes (Signed)
 Fatigue/weakness: no Dyspena: no  Light headedness: yes Blood in stool:   PET 05/10/23.

## 2023-05-26 ENCOUNTER — Other Ambulatory Visit: Payer: PPO

## 2023-05-26 ENCOUNTER — Encounter: Payer: Self-pay | Admitting: Internal Medicine

## 2023-05-26 LAB — KAPPA/LAMBDA LIGHT CHAINS
Kappa free light chain: 297.6 mg/L — ABNORMAL HIGH (ref 3.3–19.4)
Kappa, lambda light chain ratio: 40.22 — ABNORMAL HIGH (ref 0.26–1.65)
Lambda free light chains: 7.4 mg/L (ref 5.7–26.3)

## 2023-05-28 LAB — MULTIPLE MYELOMA PANEL, SERUM
Albumin SerPl Elph-Mcnc: 3.8 g/dL (ref 2.9–4.4)
Albumin/Glob SerPl: 1.1 (ref 0.7–1.7)
Alpha 1: 0.2 g/dL (ref 0.0–0.4)
Alpha2 Glob SerPl Elph-Mcnc: 0.8 g/dL (ref 0.4–1.0)
B-Globulin SerPl Elph-Mcnc: 0.9 g/dL (ref 0.7–1.3)
Gamma Glob SerPl Elph-Mcnc: 1.7 g/dL (ref 0.4–1.8)
Globulin, Total: 3.5 g/dL (ref 2.2–3.9)
IgA: 61 mg/dL (ref 61–437)
IgG (Immunoglobin G), Serum: 1855 mg/dL — ABNORMAL HIGH (ref 603–1613)
IgM (Immunoglobulin M), Srm: 127 mg/dL (ref 15–143)
M Protein SerPl Elph-Mcnc: 1.3 g/dL — ABNORMAL HIGH
Total Protein ELP: 7.3 g/dL (ref 6.0–8.5)

## 2023-06-03 ENCOUNTER — Ambulatory Visit: Payer: PPO | Admitting: Dermatology

## 2023-06-03 ENCOUNTER — Inpatient Hospital Stay: Payer: PPO

## 2023-06-03 VITALS — BP 137/79 | HR 77 | Temp 97.2°F | Resp 18

## 2023-06-03 DIAGNOSIS — D649 Anemia, unspecified: Secondary | ICD-10-CM

## 2023-06-03 DIAGNOSIS — D509 Iron deficiency anemia, unspecified: Secondary | ICD-10-CM | POA: Diagnosis not present

## 2023-06-03 MED ORDER — IRON SUCROSE 20 MG/ML IV SOLN
200.0000 mg | Freq: Once | INTRAVENOUS | Status: AC
  Administered 2023-06-03: 200 mg via INTRAVENOUS
  Filled 2023-06-03: qty 10

## 2023-06-04 ENCOUNTER — Encounter (INDEPENDENT_AMBULATORY_CARE_PROVIDER_SITE_OTHER): Payer: Self-pay | Admitting: Vascular Surgery

## 2023-06-04 ENCOUNTER — Ambulatory Visit (INDEPENDENT_AMBULATORY_CARE_PROVIDER_SITE_OTHER): Payer: PPO

## 2023-06-04 ENCOUNTER — Ambulatory Visit (INDEPENDENT_AMBULATORY_CARE_PROVIDER_SITE_OTHER): Payer: PPO | Admitting: Vascular Surgery

## 2023-06-04 VITALS — BP 146/74 | HR 76 | Resp 18 | Ht 69.0 in | Wt 192.8 lb

## 2023-06-04 DIAGNOSIS — I70211 Atherosclerosis of native arteries of extremities with intermittent claudication, right leg: Secondary | ICD-10-CM

## 2023-06-04 DIAGNOSIS — I10 Essential (primary) hypertension: Secondary | ICD-10-CM | POA: Diagnosis not present

## 2023-06-04 DIAGNOSIS — E785 Hyperlipidemia, unspecified: Secondary | ICD-10-CM

## 2023-06-04 NOTE — Progress Notes (Signed)
MRN : [id number]  Jonathon Barrera is a 83 y.o. (03/21/1941) male who presents with chief complaint of  Chief Complaint  Patient presents with   Follow-up    6 month follow uo with ABI  .  History of Present Illness: Patient returns today in follow up of PAD.  He is doing quite well.  He has been working hard to increase his walking and has done so steadily over the past year.  No new wounds, ulceration, or infection.  He does still have right leg claudication symptoms, but it is at longer distances and he does seem to be improved.  He continues on Eliquis.  ABIs today show improvement in the right ABI at 0.84 with a digit pressure of 74.  Left ABI is 1.05 with normal digit pressure.  Current Outpatient Medications  Medication Sig Dispense Refill   apixaban (ELIQUIS) 5 MG TABS tablet Take 1 tablet (5 mg total) by mouth 2 (two) times daily. 180 tablet 1   Artificial Tear Solution (SOOTHE XP) SOLN Place 1 drop into both eyes 3 (three) times daily as needed (dry eyes).     ascorbic acid (VITAMIN C) 500 MG tablet Take 500 mg by mouth daily.     ferrous sulfate 325 (65 FE) MG tablet Take 325 mg by mouth 2 (two) times daily with a meal.     finasteride (PROSCAR) 5 MG tablet Take 5 mg by mouth daily.     glimepiride (AMARYL) 4 MG tablet Take 4 mg by mouth in the morning and at bedtime.     hydrochlorothiazide (HYDRODIURIL) 25 MG tablet Take 25 mg by mouth daily.     Misc Natural Products (URINOZINC PLUS PO) Take 1 tablet by mouth 2 (two) times daily.      telmisartan (MICARDIS) 80 MG tablet Take 80 mg by mouth daily.     pioglitazone (ACTOS) 30 MG tablet Take by mouth.     No current facility-administered medications for this visit.    Past Medical History:  Diagnosis Date   Actinic keratosis    Acute deep vein thrombosis (DVT) of distal vein of right lower extremity (HCC) 06/19/2020   Aortic atherosclerosis (HCC)    Bladder carcinoma (HCC) 05/26/2007   a.) papillary-transitional cell    BPH (benign prostatic hyperplasia)    Colon polyps    COPD (chronic obstructive pulmonary disease) (HCC)    Current use of long term anticoagulation    a.) Apixaban   Diverticulosis    DOE (dyspnea on exertion)    History of SCC (squamous cell carcinoma) of skin 01/06/2023   left deltoid  ED&C done 01/06/2023   HOH (hard of hearing)    a.) uses BILATERAL assistive devices   Hyperlipemia    Hypertension    Mass    Lt lower cheek   Melanoma (HCC) 2000   Tx by Dr Orson Aloe   PAD (peripheral artery disease) [facility])    Peripheral vascular disease (HCC)    Pulmonary embolism (HCC) 06/19/2020   a.) RLL; no associated RIGHT heart strain   RBBB (right bundle branch block)    Squamous cell carcinoma of skin 02/01/2017   Left dorsum latera. hand. EDC   Squamous cell carcinoma of skin 02/01/2017   Left dorsum base of thumb. EDC   T2DM (type 2 diabetes mellitus) (HCC)     Past Surgical History:  Procedure Laterality Date   back skin cancer     BLADDER SURGERY     CARDIAC  CATHETERIZATION     CATARACT EXTRACTION W/PHACO Right 04/30/2015   Procedure: CATARACT EXTRACTION PHACO AND INTRAOCULAR LENS PLACEMENT (IOC);  Surgeon: Galen Manila, MD;  Location: [facility] ORS;  Service: Ophthalmology;  Laterality: Right;  [place] 01:03 AP% 21.4 CDE  13.57 fluid pack lot # [id number]   CATARACT EXTRACTION W/PHACO Left 05/21/2015   Procedure: CATARACT EXTRACTION PHACO AND INTRAOCULAR LENS PLACEMENT (IOC);  Surgeon: Galen Manila, MD;  Location: [facility] ORS;  Service: Ophthalmology;  Laterality: Left;  [place] 00:52 AP% 18.9 CDE 9.95 fluid pack lot #[id number]   COLONOSCOPY WITH PROPOFOL N/A 11/05/2014   Procedure: COLONOSCOPY WITH PROPOFOL;  Surgeon: Wallace Cullens, MD;  Location: [facility] ENDOSCOPY;  Service: Gastroenterology;  Laterality: N/A;   COLONOSCOPY WITH PROPOFOL N/A 05/19/2021   Procedure: COLONOSCOPY WITH PROPOFOL;  Surgeon: Jaynie Collins, DO;  Location: [facility] ENDOSCOPY;  Service: Gastroenterology;   Laterality: N/A;  DM Patient is hard of hearing and would like for wife to answer questions, please.   ENDARTERECTOMY FEMORAL Right 02/26/2021   Procedure: ENDARTERECTOMY FEMORAL (SFA STENT PLACEMEN);  Surgeon: Annice Needy, MD;  Location: [facility] ORS;  Service: Vascular;  Laterality: Right;   ESOPHAGOGASTRODUODENOSCOPY (EGD) WITH PROPOFOL N/A 05/19/2021   Procedure: ESOPHAGOGASTRODUODENOSCOPY (EGD) WITH PROPOFOL;  Surgeon: Jaynie Collins, DO;  Location: [facility] ENDOSCOPY;  Service: Gastroenterology;  Laterality: N/A;   HERNIA REPAIR     inguinal   IR BONE MARROW BIOPSY & ASPIRATION  03/23/2023   KYPHOPLASTY N/A 02/23/2019   Procedure: T12 KYPHOPLASTY;  Surgeon: Kennedy Bucker, MD;  Location: [facility] ORS;  Service: Orthopedics;  Laterality: N/A;   LESION EXCISION N/A 02/23/2018   Procedure: EXCISION SCALP CYST AND FACIAL CYST;  Surgeon: Carolan Shiver, MD;  Location: [facility] ORS;  Service: General;  Laterality: N/A;   LOWER EXTREMITY ANGIOGRAPHY Right 01/23/2021   Procedure: LOWER EXTREMITY ANGIOGRAPHY;  Surgeon: Annice Needy, MD;  Location: [facility] INVASIVE CV LAB;  Service: Cardiovascular;  Laterality: Right;   LOWER EXTREMITY ANGIOGRAPHY Right 01/12/2022   Procedure: Lower Extremity Angiography;  Surgeon: Annice Needy, MD;  Location: [facility] INVASIVE CV LAB;  Service: Cardiovascular;  Laterality: Right;   LOWER EXTREMITY ANGIOGRAPHY Right 01/26/2022   Procedure: Lower Extremity Angiography;  Surgeon: Annice Needy, MD;  Location: [facility] INVASIVE CV LAB;  Service: Cardiovascular;  Laterality: Right;   SHOULDER ARTHROSCOPY W/ ROTATOR CUFF REPAIR Right    TUR-BT       Social History   Tobacco Use   Smoking status: Every Day    Current packs/day: 0.50    Types: Cigarettes   Smokeless tobacco: Never  Vaping Use   Vaping status: Never Used  Substance Use Topics   Alcohol use: Yes    Alcohol/week: 6.0 standard drinks of alcohol    Types: 6 Cans of beer per week   Drug use: No      Family History   Problem Relation Age of Onset   Diabetes Mother    Colon cancer Mother    Diabetes Father      No Known Allergies  REVIEW OF SYSTEMS (Negative unless checked)   Constitutional: [] Weight loss  [] Fever  [] Chills Cardiac: [] Chest pain   [] Chest pressure   [] Palpitations   [] Shortness of breath when laying flat   [] Shortness of breath at rest   [] Shortness of breath with exertion. Vascular:  [x] Pain in legs with walking   [] Pain in legs at rest   [] Pain in legs when laying flat   [x] Claudication   [] Pain in feet  when walking  [] Pain in feet at rest  [] Pain in feet when laying flat   [] History of DVT   [] Phlebitis   [] Swelling in legs   [] Varicose veins   [] Non-healing ulcers Pulmonary:   [] Uses home oxygen   [] Productive cough   [] Hemoptysis   [] Wheeze  [] COPD   [] Asthma Neurologic:  [] Dizziness  [] Blackouts   [] Seizures   [] History of stroke   [] History of TIA  [] Aphasia   [] Temporary blindness   [] Dysphagia   [] Weakness or numbness in arms   [] Weakness or numbness in legs Musculoskeletal:  [x] Arthritis   [] Joint swelling   [] Joint pain   [] Low back pain Hematologic:  [] Easy bruising  [] Easy bleeding   [] Hypercoagulable state   [] Anemic   Gastrointestinal:  [] Blood in stool   [] Vomiting blood  [] Gastroesophageal reflux/heartburn   [] Abdominal pain Genitourinary:  [] Chronic kidney disease   [] Difficult urination  [] Frequent urination  [] Burning with urination   [] Hematuria Skin:  [] Rashes   [] Ulcers   [] Wounds Psychological:  [] History of anxiety   []  History of major depression.  Physical Examination  BP (!) 146/74   Pulse 76   Resp 18   Ht 5\' 9"  (1.753 m)   Wt 192 lb 12.8 oz (87.5 kg)   BMI 28.47 kg/m  Gen:  WD/WN, NAD Head: [place]/AT, No temporalis wasting. Ear/Nose/Throat: Hearing grossly intact, nares w/o erythema or drainage Eyes: Conjunctiva clear. Sclera non-icteric Neck: Supple.  Trachea midline Pulmonary:  Good air movement, no use of accessory muscles.  Cardiac: RRR, no  JVD Vascular:  Vessel Right Left  Radial Palpable Palpable                          PT 1+ Palpable 1+ Palpable  DP 1+ Palpable 2+ Palpable   Gastrointestinal: soft, non-tender/non-distended. No guarding/reflex.  Musculoskeletal: M/S 5/5 throughout.  No deformity or atrophy. No edema. Neurologic: Sensation grossly intact in extremities.  Symmetrical.  Speech is fluent.  Psychiatric: Judgment intact, Mood & affect appropriate for pt's clinical situation. Dermatologic: No rashes or ulcers noted.  No cellulitis or open wounds.      Labs Recent Results (from the past 2160 hours)  Surgical pathology     Status: None   Collection Time: 03/23/23 12:00 AM  Result Value Ref Range   SURGICAL PATHOLOGY      Surgical Pathology CASE: [id number] PATIENT: Jonathon Barrera Bone Marrow Report     Clinical History: MGUS     DIAGNOSIS:  BONE MARROW, ASPIRATE, CLOT, CORE: -Hypercellular bone marrow with plasma cell neoplasm -See comment  PERIPHERAL BLOOD: -Pancytopenia  COMMENT:  The bone marrow is hypercellular for age with increased number of atypical plasma cells representing 14% of all cells in the aspirate associated with predominantly small clusters in the clot and biopsy sections.  The plasma cells display kappa light chain restriction consistent with plasma cell neoplasm. The background shows trilineage hematopoiesis with generally mild nonspecific changes.  Features diagnostic of a myeloid neoplastic process are not present. Correlation with cytogenetic and FISH studies is recommended.  MICROSCOPIC DESCRIPTION:  PERIPHERAL BLOOD SMEAR: The red blood cells display mild anisopoikilocytosis with mild polychromasia.  The white blood cells are decreased in  number with scattered slightly hypogranular neutrophils. The platelets are decreased in number.  BONE MARROW ASPIRATE: Bone marrow particles present Erythroid precursors: Orderly and progressive maturation for  the most part.  Only occasional late precursors display nuclear cytoplasmic dyssynchrony. Granulocytic precursors: Progressive maturation  with some neutrophils displaying hypogranulation and/or hypolobation. Megakaryocytes: Abundant with predominantly normal morphology Lymphocytes/plasma cells: The plasma cells are increased in number representing 14% of all cells associated with atypical cytologic features characterized by cytomegaly and/or small nucleoli.  Large lymphoid aggregates are not seen.  TOUCH PREPARATIONS: A mixture of cell types but with increased number of plasma cells.  CLOT AND BIOPSY: The sections show 50 to 70% cellularity with a mixture of cell types.  Scattered predominantly small clusters of atypical plasma cells are seen characterized by  cells with partially clumped to vesicular chromatin and small nucleoli.  Occasional minute interstitial lymphoid aggregates primarily composed of small lymphoid cells are seen. Immunohistochemical stain for CD138 and in situ hybridization for kappa and lambda were performed on blocks B1 and C1 with appropriate controls. CD138 highlights the plasma cell component consisting of interstitial cells and numerous predominantly small clusters and displays kappa light chain restriction.  IRON STAIN: Iron stains are performed on a bone marrow aspirate or touch imprint smear and section of clot. The controls stained appropriately.       Storage Iron: Present      Ring Sideroblasts: Absent  ADDITIONAL DATA/TESTING: The specimen was sent for cytogenetic analysis and FISH for multiple myeloma and a separate report will follow.  CELL COUNT DATA:  Bone Marrow count performed on 500 cells shows: Blasts:   1%   Myeloid:  50% Promyelocytes: 0%   Erythroid:     26% Myelocytes:     11%  Lymphocytes:   7% Metamyelocytes:     7%   Plasma cells:  14% Bands:    9% Neutrophils:   18%  M:E ratio:     1.92 Eosinophils:   4% Basophils:      0% Monocytes:     3%  Lab Data: CBC performed on 03/23/2023 shows: WBC: 3.3 k/uL  Neutrophils:   42% Hgb: 11.4 g/dL Lymphocytes:   16% HCT: 34.3 %    Monocytes:     13% MCV: 100.9 fL  Eosinophils:   2% RDW: 13.9 %    Basophils:     1% PLT: 120 k/uL    GROSS DESCRIPTION:  A: Aspirate smear  B: Received in B-plus fixative are tissue fragments measuring 1.5 x 0.6 x 0.2 cm in aggregate.  The specimen is submitted in toto.  C: Received in B-plus fixative are fragments and cores of bone measuring 1 x 0.8 x 0.2 cm in aggregate.  The specimen is submitted in toto following decalcification.  [facility] 03/23/2023)   Final Diagnosis performed by Guerry Bruin, MD.   Electronically signed 03/25/2023 Technical and / or Professional components performed at [facility], [address] [place], [place] [address].  Immunohistochemistry Technical component (if applicable) was performed at [facility]. [address], [place], [place] [address].   IMMUNOHISTOCHEMISTRY DISCLAIMER (if applicable): Some of these immunohistochemical stains may have been developed and the performance characteristics determine by [facility]. Some may not have been cleared or approved by the U.S. Food and Drug Administration. The FDA has determined that such clearance or approval is not necessary. This test is used for clinical purposes. It should not be regarded as investigational or for research. This laboratory is certified under the Clinical Laboratory Improvement Amendments of 1988 (CLIA-88) as qualified to perform high complexity clinical laboratory testing.  The controls stained appropriately.   IHC stains are performed on formalin fixed, paraffin embedded tissue using a 3,3"diaminobenzidine (DAB)  chromogen and Leica Bond Autostainer System. The stainin g intensity of the nucleus is score manually and is reported as the percentage of tumor cell nuclei  demonstrating specific nuclear staining. The specimens are fixed in 10% Neutral Formalin for at least 6 hours and up to 72hrs. These tests are validated on decalcified tissue. Results should be interpreted with caution given the possibility of false negative results on decalcified specimens. Antibody Clones are as follows ER-clone 12F, PR-clone 16, Ki67- clone MM1. Some of these immunohistochemical stains may have been developed and the performance characteristics determined by [facility].   Glucose, capillary     Status: Abnormal   Collection Time: 03/23/23  8:32 AM  Result Value Ref Range   Glucose-Capillary 123 (H) 70 - 99 mg/dL    Comment: Glucose reference range applies only to samples taken after fasting for at least 8 hours.  CBC with Differential/Platelet     Status: Abnormal   Collection Time: 03/23/23  8:46 AM  Result Value Ref Range   WBC 3.3 (L) 4.0 - 10.5 K/uL   RBC 3.40 (L) 4.22 - 5.81 MIL/uL   Hemoglobin 11.4 (L) 13.0 - 17.0 g/dL   HCT 09.8 (L) 11.9 - 14.7 %   MCV 100.9 (H) 80.0 - 100.0 fL   MCH 33.5 26.0 - 34.0 pg   MCHC 33.2 30.0 - 36.0 g/dL   RDW 82.9 56.2 - 13.0 %   Platelets 120 (L) 150 - 400 K/uL    Comment: REPEATED TO VERIFY   nRBC 0.0 0.0 - 0.2 %   Neutrophils Relative % 49 %   Neutro Abs 1.6 (L) 1.7 - 7.7 K/uL   Lymphocytes Relative 33 %   Lymphs Abs 1.1 0.7 - 4.0 K/uL   Monocytes Relative 11 %   Monocytes Absolute 0.4 0.1 - 1.0 K/uL   Eosinophils Relative 4 %   Eosinophils Absolute 0.1 0.0 - 0.5 K/uL   Basophils Relative 2 %   Basophils Absolute 0.1 0.0 - 0.1 K/uL   Immature Granulocytes 1 %   Abs Immature Granulocytes 0.03 0.00 - 0.07 K/uL    Comment: Performed at [facility], [address] [place], [place] [address]  Glucose, capillary     Status: Abnormal   Collection Time: 05/10/23  8:11 AM  Result Value Ref Range   Glucose-Capillary 147 (H) 70 - 99 mg/dL    Comment: Glucose reference range applies only to samples taken  after fasting for at least 8 hours.  Ferritin     Status: Abnormal   Collection Time: 05/25/23  2:32 PM  Result Value Ref Range   Ferritin 23 (L) 24 - 336 ng/mL    Comment: Performed at [facility], [address] [place], [place] [address]  Iron and TIBC     Status: None   Collection Time: 05/25/23  2:32 PM  Result Value Ref Range   Iron 109 45 - 182 ug/dL   TIBC 952 841 - 324 ug/dL   Saturation Ratios 27 17.9 - 39.5 %   UIBC 290 ug/dL    Comment: Performed at [facility], [address] [place], [place] [address]  Multiple Myeloma Panel (SPEP&IFE w/QIG)     Status: Abnormal   Collection Time: 05/25/23  2:32 PM  Result Value Ref Range   IgG (Immunoglobin G), Serum 1,855 (H) 603 - 1,613 mg/dL   IgA 61 61 - 725 mg/dL   IgM (Immunoglobulin M), Srm 127 15 - 143 mg/dL  Total Protein ELP 7.3 6.0 - 8.5 g/dL   Albumin SerPl Elph-Mcnc 3.8 2.9 - 4.4 g/dL   Alpha 1 0.2 0.0 - 0.4 g/dL   Alpha2 Glob SerPl Elph-Mcnc 0.8 0.4 - 1.0 g/dL   B-Globulin SerPl Elph-Mcnc 0.9 0.7 - 1.3 g/dL   Gamma Glob SerPl Elph-Mcnc 1.7 0.4 - 1.8 g/dL   M Protein SerPl Elph-Mcnc 1.3 (H) Not Observed g/dL   Globulin, Total 3.5 2.2 - 3.9 g/dL   Albumin/Glob SerPl 1.1 0.7 - 1.7   IFE 1 Comment (A)     Comment: (NOTE) Immunofixation shows IgG monoclonal protein with kappa light chain specificity. PLEASE NOTE: Samples from patients receiving DARZALEX(R) (daratumumab) or SARCLISA(R)(isatuximab-irfc) treatment can appear as an "IgG kappa" and mask a complete response (CR). If this patient is receiving these therapies, this IFE assay interference can be removed by ordering test number 123218-"Immunofixation, Daratumumab-Specific, Serum" or [id number]-"Immunofixation, Isatuximab-Specific, Serum" and submitting a new sample for testing or by calling the lab to add this test to the current sample.    Please Note Comment     Comment: (NOTE) Protein electrophoresis scan will follow via computer,  mail, or courier delivery. Performed At: [facility] [address] [place], [place] [id number] Jolene Schimke MD [id number]   Kappa/lambda light chains     Status: Abnormal   Collection Time: 05/25/23  2:32 PM  Result Value Ref Range   Kappa free light chain 297.6 (H) 3.3 - 19.4 mg/L   Lambda free light chains 7.4 5.7 - 26.3 mg/L   Kappa, lambda light chain ratio 40.22 (H) 0.26 - 1.65    Comment: (NOTE) Performed At: [facility] [address] [place], [place] [id number] Jolene Schimke MD [id number]   Vitamin B12     Status: None   Collection Time: 05/25/23  2:32 PM  Result Value Ref Range   Vitamin B-12 668 180 - 914 pg/mL    Comment: (NOTE) This assay is not validated for testing neonatal or myeloproliferative syndrome specimens for Vitamin B12 levels. Performed at [facility], [address] [place], [place] [address]   CMP (Cancer Center only)     Status: Abnormal   Collection Time: 05/25/23  2:32 PM  Result Value Ref Range   Sodium 136 135 - 145 mmol/L   Potassium 3.6 3.5 - 5.1 mmol/L   Chloride 102 98 - 111 mmol/L   CO2 25 22 - 32 mmol/L   Glucose, Bld 204 (H) 70 - 99 mg/dL    Comment: Glucose reference range applies only to samples taken after fasting for at least 8 hours.   BUN 22 8 - 23 mg/dL   Creatinine 1.66 0.63 - 1.24 mg/dL   Calcium 9.3 8.9 - 01.6 mg/dL   Total Protein 7.7 6.5 - 8.1 g/dL   Albumin 4.0 3.5 - 5.0 g/dL   AST 21 15 - 41 U/L   ALT 23 0 - 44 U/L   Alkaline Phosphatase 63 38 - 126 U/L   Total Bilirubin 0.7 0.0 - 1.2 mg/dL   GFR, Estimated >01 >09 mL/min    Comment: (NOTE) Calculated using the CKD-EPI Creatinine Equation (2021)    Anion gap 9 5 - 15    Comment: Performed at [facility], [address] [place], [place] [address]  CBC with Differential (Cancer Center Only)     Status: Abnormal   Collection Time: 05/25/23  2:32 PM  Result Value Ref Range   WBC Count 4.3 4.0 - 10.5  K/uL   RBC 3.14 (L)  4.22 - 5.81 MIL/uL   Hemoglobin 10.6 (L) 13.0 - 17.0 g/dL   HCT 98.1 (L) 19.1 - 47.8 %   MCV 103.8 (H) 80.0 - 100.0 fL   MCH 33.8 26.0 - 34.0 pg   MCHC 32.5 30.0 - 36.0 g/dL   RDW 29.5 62.1 - 30.8 %   Platelet Count 194 150 - 400 K/uL   nRBC 0.0 0.0 - 0.2 %   Neutrophils Relative % 52 %   Neutro Abs 2.2 1.7 - 7.7 K/uL   Lymphocytes Relative 32 %   Lymphs Abs 1.4 0.7 - 4.0 K/uL   Monocytes Relative 9 %   Monocytes Absolute 0.4 0.1 - 1.0 K/uL   Eosinophils Relative 4 %   Eosinophils Absolute 0.2 0.0 - 0.5 K/uL   Basophils Relative 2 %   Basophils Absolute 0.1 0.0 - 0.1 K/uL   Immature Granulocytes 1 %   Abs Immature Granulocytes 0.06 0.00 - 0.07 K/uL    Comment: Performed at [facility], [address] [place], [place] [address]    Radiology NM PET Image Initial (PI) Whole Body (F-18 FDG) Result Date: 05/17/2023 CLINICAL DATA:  Initial treatment strategy for multiple myeloma. EXAM: NUCLEAR MEDICINE PET WHOLE BODY TECHNIQUE: 10.0 mCi F-18 FDG was injected intravenously. Full-ring PET imaging was performed from the head to foot after the radiotracer. CT data was obtained and used for attenuation correction and anatomic localization. Fasting blood glucose: 147 mg/dl COMPARISON:  CT chest 69/62/9528. FINDINGS: Mediastinal blood pool activity: SUV max 3.2 HEAD/NECK: No abnormal hypermetabolism. Incidental CT findings: None. CHEST: No abnormal hypermetabolism. Incidental CT findings: Atherosclerotic calcification of the aorta, aortic valve and coronary arteries. Heart is enlarged. No pericardial or pleural effusion. ABDOMEN/PELVIS: Hypermetabolism associated with a right inguinal hernia repair. No abnormal hypermetabolism. Incidental CT findings: Tiny hepatic cyst. Slight nodular thickening of the right adrenal gland. No specific follow-up necessary. Small low-attenuation lesion in the left kidney. No specific follow-up necessary. Small bilateral inguinal hernias contain fat. SKELETON: No  abnormal hypermetabolism. Incidental CT findings: T12 vertebral body augmentation.  Degenerative changes in the spine. EXTREMITIES: No abnormal hypermetabolism. Incidental CT findings: None. IMPRESSION: 1. No evidence of metabolically active multiple myeloma. 2. Aortic atherosclerosis (ICD10-I70.0). Coronary artery calcification. Electronically Signed   By: Leanna Battles M.D.   On: 05/17/2023 10:27    Assessment/Plan  Atherosclerosis of native arteries of extremity with intermittent claudication (HCC) ABIs today show improvement in the right ABI at 0.84 with a digit pressure of 74.  Left ABI is 1.05 with normal digit pressure.  Symptoms have gradually improved.  Doing well.  Continue to follow with ABI in 6 months.  Hypertension blood pressure control important in reducing the progression of atherosclerotic disease. On appropriate oral medications.     Type II diabetes mellitus with complication (HCC) blood glucose control important in reducing the progression of atherosclerotic disease. Also, involved in wound healing. On appropriate medications.  Festus Barren, MD  06/04/2023 1:13 PM    This note was created with Dragon medical transcription system.  Any errors from dictation are purely unintentional

## 2023-06-04 NOTE — Assessment & Plan Note (Signed)
ABIs today show improvement in the right ABI at 0.84 with a digit pressure of 74.  Left ABI is 1.05 with normal digit pressure.  Symptoms have gradually improved.  Doing well.  Continue to follow with ABI in 6 months.

## 2023-06-07 LAB — VAS US ABI WITH/WO TBI
Left ABI: 1.05
Right ABI: 0.84

## 2023-06-08 ENCOUNTER — Ambulatory Visit: Payer: PPO | Admitting: Dermatology

## 2023-06-08 ENCOUNTER — Encounter: Payer: Self-pay | Admitting: Dermatology

## 2023-06-08 DIAGNOSIS — D045 Carcinoma in situ of skin of trunk: Secondary | ICD-10-CM | POA: Diagnosis not present

## 2023-06-08 DIAGNOSIS — L820 Inflamed seborrheic keratosis: Secondary | ICD-10-CM | POA: Diagnosis not present

## 2023-06-08 DIAGNOSIS — D1801 Hemangioma of skin and subcutaneous tissue: Secondary | ICD-10-CM | POA: Diagnosis not present

## 2023-06-08 DIAGNOSIS — D492 Neoplasm of unspecified behavior of bone, soft tissue, and skin: Secondary | ICD-10-CM

## 2023-06-08 DIAGNOSIS — Z872 Personal history of diseases of the skin and subcutaneous tissue: Secondary | ICD-10-CM | POA: Diagnosis not present

## 2023-06-08 DIAGNOSIS — L821 Other seborrheic keratosis: Secondary | ICD-10-CM

## 2023-06-08 DIAGNOSIS — D099 Carcinoma in situ, unspecified: Secondary | ICD-10-CM

## 2023-06-08 DIAGNOSIS — D229 Melanocytic nevi, unspecified: Secondary | ICD-10-CM

## 2023-06-08 DIAGNOSIS — L570 Actinic keratosis: Secondary | ICD-10-CM

## 2023-06-08 DIAGNOSIS — L578 Other skin changes due to chronic exposure to nonionizing radiation: Secondary | ICD-10-CM

## 2023-06-08 DIAGNOSIS — W908XXA Exposure to other nonionizing radiation, initial encounter: Secondary | ICD-10-CM

## 2023-06-08 DIAGNOSIS — D489 Neoplasm of uncertain behavior, unspecified: Secondary | ICD-10-CM

## 2023-06-08 DIAGNOSIS — Z1283 Encounter for screening for malignant neoplasm of skin: Secondary | ICD-10-CM | POA: Diagnosis not present

## 2023-06-08 DIAGNOSIS — L814 Other melanin hyperpigmentation: Secondary | ICD-10-CM

## 2023-06-08 DIAGNOSIS — Z8589 Personal history of malignant neoplasm of other organs and systems: Secondary | ICD-10-CM

## 2023-06-08 DIAGNOSIS — Z85828 Personal history of other malignant neoplasm of skin: Secondary | ICD-10-CM | POA: Diagnosis not present

## 2023-06-08 HISTORY — DX: Carcinoma in situ, unspecified: D09.9

## 2023-06-08 NOTE — Progress Notes (Unsigned)
Follow-Up Visit   Subjective  Jonathon Barrera is a 84 y.o. male who presents for the following: Skin Cancer Screening and Upper Body Skin Exam  Hx  isk/aks,  recheck previous bx proven scc at left deltoid treated with [facility] and recheck spot at left infra axillary area.  Patient reports  still has crusted spot under left arm and several spots at hands.   The patient presents for Upper Body Skin Exam (UBSE) for skin cancer screening and mole check. The patient has spots, moles and lesions to be evaluated, some may be new or changing and the patient may have concern these could be cancer.  The following portions of the chart were reviewed this encounter and updated as appropriate: medications, allergies, medical history  Review of Systems:  No other skin or systemic complaints except as noted in HPI or Assessment and Plan.  Objective  Well appearing patient in no apparent distress; mood and affect are within normal limits.  All skin waist up examined. Relevant physical exam findings are noted in the Assessment and Plan.  left infra axillary 1.2 cm hyperkeratotic papule   b/l hands x 18 (18) Erythematous thin papules/macules with gritty scale.  right mid back x 1, left wrist x 1 (2) Erythematous stuck-on, waxy papule or plaque  Assessment & Plan   NEOPLASM OF UNCERTAIN BEHAVIOR left infra axillary Epidermal / dermal shaving  Lesion diameter (cm):  1.2 Informed consent: discussed and consent obtained   Timeout: patient name, date of birth, surgical site, and procedure verified   Procedure prep:  Patient was prepped and draped in usual sterile fashion Prep type:  Isopropyl alcohol Anesthesia: the lesion was anesthetized in a standard fashion   Anesthetic:  1% lidocaine w/ epinephrine 1-100,000 buffered w/ 8.4% NaHCO3 Instrument used: flexible razor blade   Hemostasis achieved with: pressure, aluminum chloride and electrodesiccation   Outcome: patient tolerated procedure well    Post-procedure details: sterile dressing applied and wound care instructions given   Dressing type: bandage and petrolatum    Destruction of lesion Complexity: extensive   Destruction method: electrodesiccation and curettage   Informed consent: discussed and consent obtained   Timeout:  patient name, date of birth, surgical site, and procedure verified Procedure prep:  Patient was prepped and draped in usual sterile fashion Prep type:  Isopropyl alcohol Anesthesia: the lesion was anesthetized in a standard fashion   Anesthetic:  1% lidocaine w/ epinephrine 1-100,000 buffered w/ 8.4% NaHCO3 Curettage performed in three different directions: Yes   Electrodesiccation performed over the curetted area: Yes   Lesion length (cm):  1.2 Lesion width (cm):  1.2 Margin per side (cm):  0.2 Final wound size (cm):  1.6 Hemostasis achieved with:  pressure, aluminum chloride and electrodesiccation Outcome: patient tolerated procedure well with no complications   Post-procedure details: sterile dressing applied and wound care instructions given   Dressing type: bandage and petrolatum   Specimen 1 - Surgical pathology Differential Diagnosis: Isk vs wart vs scc  Check Margins: No  ED&C done today  Isk vs wart vs scc  ED&C today    ACTINIC KERATOSIS (18) b/l hands x 18 (18) Actinic keratoses are precancerous spots that appear secondary to cumulative UV radiation exposure/sun exposure over time. They are chronic with expected duration over 1 year. A portion of actinic keratoses will progress to squamous cell carcinoma of the skin. It is not possible to reliably predict which spots will progress to skin cancer and so treatment is recommended  to prevent development of skin cancer.  Recommend daily broad spectrum sunscreen SPF 30+ to sun-exposed areas, reapply every 2 hours as needed.  Recommend staying in the shade or wearing long sleeves, sun glasses (UVA+UVB protection) and wide brim hats (4-inch  brim around the entire circumference of the hat). Call for new or changing lesions. Destruction of lesion - b/l hands x 18 (18) Complexity: simple   Destruction method: cryotherapy   Informed consent: discussed and consent obtained   Timeout:  patient name, date of birth, surgical site, and procedure verified Lesion destroyed using liquid nitrogen: Yes   Region frozen until ice ball extended beyond lesion: Yes   Outcome: patient tolerated procedure well with no complications   Post-procedure details: wound care instructions given   INFLAMED SEBORRHEIC KERATOSIS (2) right mid back x 1, left wrist x 1 (2) Symptomatic, irritating, patient would like treated. Destruction of lesion - right mid back x 1, left wrist x 1 (2) Complexity: simple   Destruction method: cryotherapy   Informed consent: discussed and consent obtained   Timeout:  patient name, date of birth, surgical site, and procedure verified Lesion destroyed using liquid nitrogen: Yes   Region frozen until ice ball extended beyond lesion: Yes   Outcome: patient tolerated procedure well with no complications   Post-procedure details: wound care instructions given   Skin cancer screening performed today.  Actinic Damage - Chronic condition, secondary to cumulative UV/sun exposure - diffuse scaly erythematous macules with underlying dyspigmentation - Recommend daily broad spectrum sunscreen SPF 30+ to sun-exposed areas, reapply every 2 hours as needed.  - Staying in the shade or wearing long sleeves, sun glasses (UVA+UVB protection) and wide brim hats (4-inch brim around the entire circumference of the hat) are also recommended for sun protection.  - Call for new or changing lesions.  Lentigines, Seborrheic Keratoses, Hemangiomas - Benign normal skin lesions - Benign-appearing - Call for any changes  Melanocytic Nevi - Tan-brown and/or pink-flesh-colored symmetric macules and papules - Benign appearing on exam today -  Observation - Call clinic for new or changing moles - Recommend daily use of broad spectrum spf 30+ sunscreen to sun-exposed areas.   HISTORY OF SQUAMOUS CELL CARCINOMA OF THE SKIN Left deltoid - txt with ED&C - 12/2022 - No evidence of recurrence today - No lymphadenopathy - Recommend regular full body skin exams - Recommend daily broad spectrum sunscreen SPF 30+ to sun-exposed areas, reapply every 2 hours as needed.  - Call if any new or changing lesions are noted between office visits   Return for 6 - 8 month ubse .  IAsher Muir, CMA, am acting as scribe for Armida Sans, MD.   Documentation: I have reviewed the above documentation for accuracy and completeness, and I agree with the above.  Armida Sans, MD

## 2023-06-08 NOTE — Patient Instructions (Addendum)
Electrodesiccation and Curettage ("Scrape and Burn") Wound Care Instructions  Leave the original bandage on for 24 hours if possible.  If the bandage becomes soaked or soiled before that time, it is OK to remove it and examine the wound.  A small amount of post-operative bleeding is normal.  If excessive bleeding occurs, remove the bandage, place gauze over the site and apply continuous pressure (no peeking) over the area for 30 minutes. If this does not work, please call our clinic as soon as possible or page your doctor if it is after hours.   Once a day, cleanse the wound with soap and water. It is fine to shower. If a thick crust develops you may use a Q-tip dipped into dilute hydrogen peroxide (mix 1:1 with water) to dissolve it.  Hydrogen peroxide can slow the healing process, so use it only as needed.    After washing, apply petroleum jelly (Vaseline) or an antibiotic ointment if your doctor prescribed one for you, followed by a bandage.    For best healing, the wound should be covered with a layer of ointment at all times. If you are not able to keep the area covered with a bandage to hold the ointment in place, this may mean re-applying the ointment several times a day.  Continue this wound care until the wound has healed and is no longer open. It may take several weeks for the wound to heal and close.  Itching and mild discomfort is normal during the healing process.  If you have any discomfort, you can take Tylenol (acetaminophen) or ibuprofen as directed on the bottle. (Please do not take these if you have an allergy to them or cannot take them for another reason).  Some redness, tenderness and white or yellow material in the wound is normal healing.  If the area becomes very sore and red, or develops a thick yellow-green material (pus), it may be infected; please notify us.    Wound healing continues for up to one year following surgery. It is not unusual to experience pain in the scar  from time to time during the interval.  If the pain becomes severe or the scar thickens, you should notify the office.    A slight amount of redness in a scar is expected for the first six months.  After six months, the redness will fade and the scar will soften and fade.  The color difference becomes less noticeable with time.  If there are any problems, return for a post-op surgery check at your earliest convenience.  To improve the appearance of the scar, you can use silicone scar gel, cream, or sheets (such as Mederma or Serica) every night for up to one year. These are available over the counter (without a prescription).  Please call our office at [phone] for any questions or concerns.    Biopsy Wound Care Instructions  Leave the original bandage on for 24 hours if possible.  If the bandage becomes soaked or soiled before that time, it is OK to remove it and examine the wound.  A small amount of post-operative bleeding is normal.  If excessive bleeding occurs, remove the bandage, place gauze over the site and apply continuous pressure (no peeking) over the area for 30 minutes. If this does not work, please call our clinic as soon as possible or page your doctor if it is after hours.   Once a day, cleanse the wound with soap and water. It is fine to  shower. If a thick crust develops you may use a Q-tip dipped into dilute hydrogen peroxide (mix 1:1 with water) to dissolve it.  Hydrogen peroxide can slow the healing process, so use it only as needed.    After washing, apply petroleum jelly (Vaseline) or an antibiotic ointment if your doctor prescribed one for you, followed by a bandage.    For best healing, the wound should be covered with a layer of ointment at all times. If you are not able to keep the area covered with a bandage to hold the ointment in place, this may mean re-applying the ointment several times a day.  Continue this wound care until the wound has healed and is no longer  open.   Itching and mild discomfort is normal during the healing process. However, if you develop pain or severe itching, please call our office.   If you have any discomfort, you can take Tylenol (acetaminophen) or ibuprofen as directed on the bottle. (Please do not take these if you have an allergy to them or cannot take them for another reason).  Some redness, tenderness and white or yellow material in the wound is normal healing.  If the area becomes very sore and red, or develops a thick yellow-green material (pus), it may be infected; please notify us.    If you have stitches, return to clinic as directed to have the stitches removed. You will continue wound care for 2-3 days after the stitches are removed.   Wound healing continues for up to one year following surgery. It is not unusual to experience pain in the scar from time to time during the interval.  If the pain becomes severe or the scar thickens, you should notify the office.    A slight amount of redness in a scar is expected for the first six months.  After six months, the redness will fade and the scar will soften and fade.  The color difference becomes less noticeable with time.  If there are any problems, return for a post-op surgery check at your earliest convenience.  To improve the appearance of the scar, you can use silicone scar gel, cream, or sheets (such as Mederma or Serica) every night for up to one year. These are available over the counter (without a prescription).  Please call our office at [phone] for any questions or concerns.      Seborrheic Keratosis  What causes seborrheic keratoses? Seborrheic keratoses are harmless, common skin growths that first appear during adult life.  As time goes by, more growths appear.  Some people may develop a large number of them.  Seborrheic keratoses appear on both covered and uncovered body parts.  They are not caused by sunlight.  The tendency to develop seborrheic  keratoses can be inherited.  They vary in color from skin-colored to gray, brown, or even black.  They can be either smooth or have a rough, warty surface.   Seborrheic keratoses are superficial and look as if they were stuck on the skin.  Under the microscope this type of keratosis looks like layers upon layers of skin.  That is why at times the top layer may seem to fall off, but the rest of the growth remains and re-grows.    Treatment Seborrheic keratoses do not need to be treated, but can easily be removed in the office.  Seborrheic keratoses often cause symptoms when they rub on clothing or jewelry.  Lesions can be in the way of shaving.  If they become inflamed, they can cause itching, soreness, or burning.  Removal of a seborrheic keratosis can be accomplished by freezing, burning, or surgery. If any spot bleeds, scabs, or grows rapidly, please return to have it checked, as these can be an indication of a skin cancer.  Cryotherapy Aftercare  Wash gently with soap and water everyday.   Apply Vaseline and Band-Aid daily until healed.        Due to recent changes in healthcare laws, you may see results of your pathology and/or laboratory studies on MyChart before the doctors have had a chance to review them. We understand that in some cases there may be results that are confusing or concerning to you. Please understand that not all results are received at the same time and often the doctors may need to interpret multiple results in order to provide you with the best plan of care or course of treatment. Therefore, we ask that you please give [place] 2 business days to thoroughly review all your results before contacting the office for clarification. Should we see a critical lab result, you will be contacted sooner.   If You Need Anything After Your Visit  If you have any questions or concerns for your doctor, please call our main line at [phone] and press option 4 to reach your doctor's  medical assistant. If no one answers, please leave a voicemail as directed and we will return your call as soon as possible. Messages left after 4 pm will be answered the following business day.   You may also send [place] a message via MyChart. We typically respond to MyChart messages within 1-2 business days.  For prescription refills, please ask your pharmacy to contact our office. Our fax number is [phone].  If you have an urgent issue when the clinic is closed that cannot wait until the next business day, you can page your doctor at the number below.    Please note that while we do our best to be available for urgent issues outside of office hours, we are not available 24/7.   If you have an urgent issue and are unable to reach [place], you may choose to seek medical care at your doctor's office, retail clinic, urgent care center, or emergency room.  If you have a medical emergency, please immediately call 911 or go to the emergency department.  Pager Numbers  - Dr. Gwen Pounds: [phone]  - Dr. Roseanne Reno: [phone]  - Dr. Katrinka Blazing: [phone]   In the event of inclement weather, please call our main line at [phone] for an update on the status of any delays or closures.  Dermatology Medication Tips: Please keep the boxes that topical medications come in in order to help keep track of the instructions about where and how to use these. Pharmacies typically print the medication instructions only on the boxes and not directly on the medication tubes.   If your medication is too expensive, please contact our office at [phone] option 4 or send [place] a message through MyChart.   We are unable to tell what your co-pay for medications will be in advance as this is different depending on your insurance coverage. However, we may be able to find a substitute medication at lower cost or fill out paperwork to get insurance to cover a needed medication.   If a prior authorization is required  to get your medication covered by your insurance company, please allow [place] 1-2 business days to complete this process.  Drug  prices often vary depending on where the prescription is filled and some pharmacies may offer cheaper prices.  The website www.goodrx.com contains coupons for medications through different pharmacies. The prices here do not account for what the cost may be with help from insurance (it may be cheaper with your insurance), but the website can give you the price if you did not use any insurance.  - You can print the associated coupon and take it with your prescription to the pharmacy.  - You may also stop by our office during regular business hours and pick up a GoodRx coupon card.  - If you need your prescription sent electronically to a different pharmacy, notify our office through [facility] or by phone at [phone] option 4.     Si Usted Necesita Algo Despus de Su Visita  Tambin puede enviarnos un mensaje a travs de Clinical cytogeneticist. Por lo general respondemos a los mensajes de MyChart en el transcurso de 1 a 2 das hbiles.  Para renovar recetas, por favor pida a su farmacia que se ponga en contacto con nuestra oficina. Annie Sable de fax es [place] [phone].  Si tiene un asunto urgente cuando la clnica est cerrada y que no puede esperar hasta el siguiente da hbil, puede llamar/localizar a su doctor(a) al nmero que aparece a continuacin.   Por favor, tenga en cuenta que aunque hacemos todo lo posible para estar disponibles para asuntos urgentes fuera del horario de Lakena Sparlin, no estamos disponibles las 24 horas del da, los 7 [address]  [address] de la [place].   Si tiene un problema urgente y no puede comunicarse con nosotros, puede optar por buscar atencin mdica  en el consultorio de su doctor(a), en una clnica privada, en un centro de atencin urgente o en una sala de emergencias.  Si tiene Engineer, drilling, por favor llame inmediatamente al 911 o vaya a la sala  de emergencias.  Nmeros de bper  - Dr. Gwen Pounds: [phone]  - Dra. Roseanne Reno: [phone]  - Dr. Katrinka Blazing: [phone]   En caso de inclemencias del tiempo, por favor llame a Lacy Duverney principal al [phone] para una actualizacin sobre el [place] de cualquier retraso o cierre.  Consejos para la medicacin en dermatologa: Por favor, guarde las cajas en las que vienen los medicamentos de uso tpico para ayudarle a seguir las instrucciones sobre dnde y cmo usarlos. Las farmacias generalmente imprimen las instrucciones del medicamento slo en las cajas y no directamente en los tubos del [place].   Si su medicamento es muy caro, por favor, pngase en contacto con Rolm Gala llamando al [phone] y presione la opcin 4 o envenos un mensaje a travs de Clinical cytogeneticist.   No podemos decirle cul ser su copago por los medicamentos por adelantado ya que esto es diferente dependiendo de la cobertura de su seguro. Sin embargo, es posible que podamos encontrar un medicamento sustituto a Audiological scientist un formulario para que el seguro cubra el medicamento que se considera necesario.   Si se requiere una autorizacin previa para que su compaa de seguros [place] su medicamento, por favor permtanos de 1 a 2 das hbiles para completar [address]  Los precios de los medicamentos varan con frecuencia dependiendo del Environmental consultant de dnde se surte la receta y alguna farmacias pueden ofrecer precios ms baratos.  El sitio web www.goodrx.com tiene cupones para medicamentos de Health and safety inspector. Los precios aqu no tienen en cuenta lo que podra costar con la ayuda del seguro (puede ser ms barato  con su seguro), pero el sitio web puede darle el precio si no Visual merchandiser.  - Puede imprimir el cupn correspondiente y llevarlo con su receta a la farmacia.  - Tambin puede pasar por nuestra oficina durante el horario de atencin regular y Education officer, museum una tarjeta de cupones de GoodRx.  -  Si necesita que su receta se enve electrnicamente a una farmacia diferente, informe a nuestra oficina a travs de MyChart de  o por telfono llamando al [phone] y presione la opcin 4.

## 2023-06-09 ENCOUNTER — Ambulatory Visit: Payer: PPO | Admitting: Internal Medicine

## 2023-06-09 ENCOUNTER — Ambulatory Visit: Payer: PPO

## 2023-06-09 LAB — SURGICAL PATHOLOGY

## 2023-06-10 ENCOUNTER — Encounter: Payer: Self-pay | Admitting: Dermatology

## 2023-06-10 ENCOUNTER — Telehealth: Payer: Self-pay

## 2023-06-10 NOTE — Telephone Encounter (Addendum)
Called patient regarding bx results. He verbalized understanding and denied further questions. Will recheck at next follow up   ----- Message from Armida Sans sent at 06/10/2023 11:17 AM EST ----- FINAL DIAGNOSIS        1. Skin, left infra axillary :       SQUAMOUS CELL CARCINOMA IN SITU, ASSOCIATED WITH VERRUCA   Cancer = SCCis with associated viral wart Already treated with EDC Recheck next visit

## 2023-06-11 DIAGNOSIS — N400 Enlarged prostate without lower urinary tract symptoms: Secondary | ICD-10-CM | POA: Diagnosis not present

## 2023-06-11 DIAGNOSIS — Z79899 Other long term (current) drug therapy: Secondary | ICD-10-CM | POA: Diagnosis not present

## 2023-06-11 DIAGNOSIS — I10 Essential (primary) hypertension: Secondary | ICD-10-CM | POA: Diagnosis not present

## 2023-06-11 DIAGNOSIS — E118 Type 2 diabetes mellitus with unspecified complications: Secondary | ICD-10-CM | POA: Diagnosis not present

## 2023-06-11 DIAGNOSIS — E782 Mixed hyperlipidemia: Secondary | ICD-10-CM | POA: Diagnosis not present

## 2023-06-11 DIAGNOSIS — D500 Iron deficiency anemia secondary to blood loss (chronic): Secondary | ICD-10-CM | POA: Diagnosis not present

## 2023-06-11 DIAGNOSIS — Z0000 Encounter for general adult medical examination without abnormal findings: Secondary | ICD-10-CM | POA: Diagnosis not present

## 2023-06-11 DIAGNOSIS — J431 Panlobular emphysema: Secondary | ICD-10-CM | POA: Diagnosis not present

## 2023-06-25 DIAGNOSIS — E782 Mixed hyperlipidemia: Secondary | ICD-10-CM | POA: Diagnosis not present

## 2023-06-25 DIAGNOSIS — I10 Essential (primary) hypertension: Secondary | ICD-10-CM | POA: Diagnosis not present

## 2023-06-25 DIAGNOSIS — E118 Type 2 diabetes mellitus with unspecified complications: Secondary | ICD-10-CM | POA: Diagnosis not present

## 2023-06-25 DIAGNOSIS — Z79899 Other long term (current) drug therapy: Secondary | ICD-10-CM | POA: Diagnosis not present

## 2023-07-15 DIAGNOSIS — M25512 Pain in left shoulder: Secondary | ICD-10-CM | POA: Diagnosis not present

## 2023-07-23 ENCOUNTER — Inpatient Hospital Stay: Payer: PPO

## 2023-07-23 ENCOUNTER — Inpatient Hospital Stay: Payer: PPO | Admitting: Internal Medicine

## 2023-07-23 ENCOUNTER — Inpatient Hospital Stay: Payer: PPO | Attending: Internal Medicine

## 2023-07-23 ENCOUNTER — Encounter: Payer: Self-pay | Admitting: Internal Medicine

## 2023-07-23 DIAGNOSIS — Z85820 Personal history of malignant melanoma of skin: Secondary | ICD-10-CM | POA: Diagnosis not present

## 2023-07-23 DIAGNOSIS — Z8551 Personal history of malignant neoplasm of bladder: Secondary | ICD-10-CM | POA: Insufficient documentation

## 2023-07-23 DIAGNOSIS — Z86718 Personal history of other venous thrombosis and embolism: Secondary | ICD-10-CM | POA: Insufficient documentation

## 2023-07-23 DIAGNOSIS — Z86711 Personal history of pulmonary embolism: Secondary | ICD-10-CM | POA: Insufficient documentation

## 2023-07-23 DIAGNOSIS — E8809 Other disorders of plasma-protein metabolism, not elsewhere classified: Secondary | ICD-10-CM | POA: Diagnosis not present

## 2023-07-23 DIAGNOSIS — D696 Thrombocytopenia, unspecified: Secondary | ICD-10-CM | POA: Insufficient documentation

## 2023-07-23 DIAGNOSIS — D509 Iron deficiency anemia, unspecified: Secondary | ICD-10-CM | POA: Diagnosis not present

## 2023-07-23 DIAGNOSIS — Z79899 Other long term (current) drug therapy: Secondary | ICD-10-CM | POA: Insufficient documentation

## 2023-07-23 DIAGNOSIS — Z7901 Long term (current) use of anticoagulants: Secondary | ICD-10-CM | POA: Diagnosis not present

## 2023-07-23 DIAGNOSIS — C9000 Multiple myeloma not having achieved remission: Secondary | ICD-10-CM | POA: Insufficient documentation

## 2023-07-23 LAB — IRON AND TIBC
Iron: 84 ug/dL (ref 45–182)
Saturation Ratios: 24 % (ref 17.9–39.5)
TIBC: 353 ug/dL (ref 250–450)
UIBC: 269 ug/dL

## 2023-07-23 LAB — CMP (CANCER CENTER ONLY)
ALT: 22 U/L (ref 0–44)
AST: 20 U/L (ref 15–41)
Albumin: 3.7 g/dL (ref 3.5–5.0)
Alkaline Phosphatase: 70 U/L (ref 38–126)
Anion gap: 8 (ref 5–15)
BUN: 23 mg/dL (ref 8–23)
CO2: 24 mmol/L (ref 22–32)
Calcium: 9.2 mg/dL (ref 8.9–10.3)
Chloride: 102 mmol/L (ref 98–111)
Creatinine: 0.86 mg/dL (ref 0.61–1.24)
GFR, Estimated: >60 mL/min (ref >60)
Glucose, Bld: 157 mg/dL — ABNORMAL HIGH (ref 70–99)
Potassium: 3.8 mmol/L (ref 3.5–5.1)
Sodium: 134 mmol/L — ABNORMAL LOW (ref 135–145)
Total Bilirubin: 0.6 mg/dL (ref 0.0–1.2)
Total Protein: 7.4 g/dL (ref 6.5–8.1)

## 2023-07-23 LAB — CBC WITH DIFFERENTIAL (CANCER CENTER ONLY)
Abs Immature Granulocytes: 0.05 10*3/uL (ref 0.00–0.07)
Basophils Absolute: 0.1 10*3/uL (ref 0.0–0.1)
Basophils Relative: 2 %
Eosinophils Absolute: 0.2 10*3/uL (ref 0.0–0.5)
Eosinophils Relative: 5 %
HCT: 32.8 % — ABNORMAL LOW (ref 39.0–52.0)
Hemoglobin: 10.9 g/dL — ABNORMAL LOW (ref 13.0–17.0)
Immature Granulocytes: 1 %
Lymphocytes Relative: 27 %
Lymphs Abs: 1.1 10*3/uL (ref 0.7–4.0)
MCH: 33.7 pg (ref 26.0–34.0)
MCHC: 33.2 g/dL (ref 30.0–36.0)
MCV: 101.5 fL — ABNORMAL HIGH (ref 80.0–100.0)
Monocytes Absolute: 0.5 10*3/uL (ref 0.1–1.0)
Monocytes Relative: 11 %
Neutro Abs: 2.2 10*3/uL (ref 1.7–7.7)
Neutrophils Relative %: 54 %
Platelet Count: 137 10*3/uL — ABNORMAL LOW (ref 150–400)
RBC: 3.23 MIL/uL — ABNORMAL LOW (ref 4.22–5.81)
RDW: 13.6 % (ref 11.5–15.5)
WBC Count: 4.1 10*3/uL (ref 4.0–10.5)
nRBC: 0 % (ref 0.0–0.2)

## 2023-07-23 LAB — VITAMIN B12: Vitamin B-12: 554 pg/mL (ref 180–914)

## 2023-07-23 LAB — FERRITIN: Ferritin: 53 ng/mL (ref 24–336)

## 2023-07-23 NOTE — Assessment & Plan Note (Addendum)
#   MULTIPLE MYELOMA: ELEVATED M protein:MARCH 2023- IgGK- 1.6 /dl; K/L= 33 [anemia-see above; no hypercalcemia no renal insufficiency; ?  Bone lesions-no x-rays done] OCT  2024- M protein- 1.7; K/L ratio: 57. DEC 12th, Bone marrow Biopsy:  The bone marrow is hypercellular for age with increased number of atypical plasma cells representing 14%;  The background shows trilineage hematopoiesis with generally mild nonspecific changes.  Features  diagnostic of a myeloid neoplastic process are not present. Cytogenetic: FISH: Gain of 11/ q- standard risk. JAN 2025- PET scan- negative for any bone lesions.   # Consistent with diagnosis of multiple myeloma-given the mild - moderate anemia [see below]-without any evidence of any other endorgan damage-hypercalcemia or acute renal failure or bone lesions on PET scan.  Patient is not a candidate for stem cell transplant given his age/comorbidities.    # Discussed options of treatment with pills/Revlimid; injections Velcade/ dara to help treat the disease which is unfortunately incurable.  However newer treatments have shown deep and long remissions.  Discussed that if anemia does not improve status post infusion-or any other endorgan dysfunction noted would recommend starting chemotherapy.   # Mild- moderate anemia-multifactorial  iron deficiency/multiple myeloma-[October 2024-hemoglobin 11; iron saturation 36; ferritin-27];-status post Venofer hemoglobin today is 10.9.  Slightly improved.  Continue oral iron.  HOLD OFF iron infusion today.  # March 2022 [Dr.Sparks]-small volume PE/ RIGHT LE DVT- Positive for deep venous thrombosis in the right lower extremity [Above Knee- fem]; August 2022-Given recurrent DVT; prior history of PE--currently on indefinite anticoagulation.  No Active Bleeding.  # Intermittent thrombocytopenia isolated  > 100- mild monitor for now-while on Eliquis. Stable.    # Hx o PVD [s/p stenting]- on eliquis-n [Dr.Dew]  # DISPOSITION: # HOLD  venofer today # follow up in  10 weeks- 2 weeks prior-- MD; labs- - cbc/cmp; B12; MM panel; K/L light chains; iron studies; ferritin-/possible venofer;  Dr.B

## 2023-07-23 NOTE — Progress Notes (Signed)
 No concerns today

## 2023-07-23 NOTE — Progress Notes (Signed)
 [facility] CONSULT NOTE  Patient Care Team: Marguarite Arbour, MD as PCP - General (Internal Medicine) Earna Coder, MD as Consulting Physician (Oncology)  CHIEF COMPLAINTS/PURPOSE OF CONSULTATION: ANEMIA  HEMATOLOGY HISTORY:  # ANEMIA NORMOCYTIC  [JAN 2023- PCP-hemoglobin 8.7; saturation 7%; ferritin 23-LL-28]EGD/ colonoscopy-JAN 2023 [[facility]]; NO CT-A/P or capsule.   # MARCH 2023-possible MGUS; IgG kappa 1.6 g; kappa/lambda light chain ratio 33  March 2022-PE small volume right lower lobe; and right lower extremity above-knee DVT.  On Eliquis; OFF in March 2023; JUNE 15th-DVT-femoral/popliteal nonocclusive-started on Eliquis- INDEFINITE   # Hx of PVD [Dr.Dew]   Latest Reference Range & Units Most Recent 01/29/22 07:55 08/11/22 07:55 02/10/23 08:18  IgG (Immunoglobin G), Serum 603 - 1,613 mg/dL 1,610 (H) 96/04/54 09:81 1,975 (H) 2,058 (H) 2,120 (H)  IgM (Immunoglobulin M), Srm 15 - 143 mg/dL 191 47/82/95 62:13 086 (H) 156 (H) 134  IgA 61 - 437 mg/dL 66 57/84/69 62:95 75 81 66 she Not Please so [name]  Kappa free light chain 3.3 - 19.4 mg/L 322.9 (H) 02/10/23 08:18 293.7 (H) 239.9 (H) 322.9 (H)  Lambda free light chains 5.7 - 26.3 mg/L 5.6 (L) 02/10/23 08:18 8.9 9.2 5.6 (L)  Kappa, lambda light chain ratio 0.26 - 1.65  57.66 (H) 02/10/23 08:18 33.00 (H) 26.08 (H) 57.66 (H)  (H): Data is abnormally high (L): Data is abnormally low    HISTORY OF PRESENTING ILLNESS: Patient accompanied by his wife.  Ambulating independently.  Slightly hard of hearing.  Gwenyth Ober 83 y.o.  male iron deficient anemia-unclear etiology; history of DVT/PE-recurrent on eliquis; IV iron infusions for iron deficiency, and a history of active myeloma on surveillance is here for a follow up.   Patient status post IV iron infusion.  Notes to have slight improvement of energy levels.  He is on oral iron  BID.  Patient overall feels well  over all. Appetite is good.    Review of Systems  Constitutional:  Positive for malaise/fatigue. Negative for chills, diaphoresis, fever and weight loss.  HENT:  Negative for nosebleeds and sore throat.   Eyes:  Negative for double vision.  Respiratory:  Negative for cough, hemoptysis, sputum production, shortness of breath and wheezing.   Cardiovascular:  Negative for chest pain, palpitations, orthopnea and leg swelling.  Gastrointestinal:  Negative for abdominal pain, blood in stool, constipation, diarrhea, heartburn, melena, nausea and vomiting.  Genitourinary:  Negative for dysuria, frequency and urgency.  Musculoskeletal:  Positive for joint pain. Negative for back pain.  Skin: Negative.  Negative for itching and rash.  Neurological:  Negative for dizziness, tingling, focal weakness, weakness and headaches.  Endo/Heme/Allergies:  Does not bruise/bleed easily.  Psychiatric/Behavioral:  Negative for depression. The patient is not nervous/anxious and does not have insomnia.     MEDICAL HISTORY:  Past Medical History:  Diagnosis Date   Actinic keratosis    Acute deep vein thrombosis (DVT) of distal vein of right lower extremity (HCC) 06/19/2020   Aortic atherosclerosis (HCC)    Bladder carcinoma (HCC) 05/26/2007   a.) papillary-transitional cell   BPH (benign prostatic hyperplasia)    Colon polyps    COPD (chronic obstructive pulmonary disease) (HCC)    Current use of long term anticoagulation    a.) Apixaban   Diverticulosis    DOE (dyspnea on exertion)    History of SCC (squamous cell carcinoma) of skin 01/06/2023   left deltoid  ED&C done  01/06/2023   HOH (hard of hearing)    a.) uses BILATERAL assistive devices   Hyperlipemia    Hypertension    Mass    Lt lower cheek   Melanoma (HCC) 2000   Tx by Dr Orson Aloe   PAD (peripheral artery disease) [facility])    Peripheral vascular disease (HCC)    Pulmonary embolism (HCC) 06/19/2020   a.) RLL; no associated RIGHT heart strain    RBBB (right bundle branch block)    Squamous cell carcinoma in situ (SCCIS) 06/08/2023   left infra axillary txt with ED&C   Squamous cell carcinoma of skin 02/01/2017   Left dorsum latera. hand. EDC   Squamous cell carcinoma of skin 02/01/2017   Left dorsum base of thumb. EDC   T2DM (type 2 diabetes mellitus) (HCC)     SURGICAL HISTORY: Past Surgical History:  Procedure Laterality Date   back skin cancer     BLADDER SURGERY     CARDIAC CATHETERIZATION     CATARACT EXTRACTION W/PHACO Right 04/30/2015   Procedure: CATARACT EXTRACTION PHACO AND INTRAOCULAR LENS PLACEMENT (IOC);  Surgeon: Galen Manila, MD;  Location: [facility] ORS;  Service: Ophthalmology;  Laterality: Right;  [place] 01:03 AP% 21.4 CDE  13.57 fluid pack lot # [id number]   CATARACT EXTRACTION W/PHACO Left 05/21/2015   Procedure: CATARACT EXTRACTION PHACO AND INTRAOCULAR LENS PLACEMENT (IOC);  Surgeon: Galen Manila, MD;  Location: [facility] ORS;  Service: Ophthalmology;  Laterality: Left;  [place] 00:52 AP% 18.9 CDE 9.95 fluid pack lot #[id number]   COLONOSCOPY WITH PROPOFOL N/A 11/05/2014   Procedure: COLONOSCOPY WITH PROPOFOL;  Surgeon: Wallace Cullens, MD;  Location: [facility] ENDOSCOPY;  Service: Gastroenterology;  Laterality: N/A;   COLONOSCOPY WITH PROPOFOL N/A 05/19/2021   Procedure: COLONOSCOPY WITH PROPOFOL;  Surgeon: Jaynie Collins, DO;  Location: [facility] ENDOSCOPY;  Service: Gastroenterology;  Laterality: N/A;  DM Patient is hard of hearing and would like for wife to answer questions, please.   ENDARTERECTOMY FEMORAL Right 02/26/2021   Procedure: ENDARTERECTOMY FEMORAL (SFA STENT PLACEMEN);  Surgeon: Annice Needy, MD;  Location: [facility] ORS;  Service: Vascular;  Laterality: Right;   ESOPHAGOGASTRODUODENOSCOPY (EGD) WITH PROPOFOL N/A 05/19/2021   Procedure: ESOPHAGOGASTRODUODENOSCOPY (EGD) WITH PROPOFOL;  Surgeon: Jaynie Collins, DO;  Location: [facility] ENDOSCOPY;  Service: Gastroenterology;  Laterality: N/A;   HERNIA REPAIR      inguinal   IR BONE MARROW BIOPSY & ASPIRATION  03/23/2023   KYPHOPLASTY N/A 02/23/2019   Procedure: T12 KYPHOPLASTY;  Surgeon: Kennedy Bucker, MD;  Location: [facility] ORS;  Service: Orthopedics;  Laterality: N/A;   LESION EXCISION N/A 02/23/2018   Procedure: EXCISION SCALP CYST AND FACIAL CYST;  Surgeon: Carolan Shiver, MD;  Location: [facility] ORS;  Service: General;  Laterality: N/A;   LOWER EXTREMITY ANGIOGRAPHY Right 01/23/2021   Procedure: LOWER EXTREMITY ANGIOGRAPHY;  Surgeon: Annice Needy, MD;  Location: [facility] INVASIVE CV LAB;  Service: Cardiovascular;  Laterality: Right;   LOWER EXTREMITY ANGIOGRAPHY Right 01/12/2022   Procedure: Lower Extremity Angiography;  Surgeon: Annice Needy, MD;  Location: [facility] INVASIVE CV LAB;  Service: Cardiovascular;  Laterality: Right;   LOWER EXTREMITY ANGIOGRAPHY Right 01/26/2022   Procedure: Lower Extremity Angiography;  Surgeon: Annice Needy, MD;  Location: [facility] INVASIVE CV LAB;  Service: Cardiovascular;  Laterality: Right;   SHOULDER ARTHROSCOPY W/ ROTATOR CUFF REPAIR Right    TUR-BT      SOCIAL HISTORY: Social History   Socioeconomic History   Marital status: Married    Spouse name: Not  on file   Number of children: Not on file   Years of education: Not on file   Highest education level: Not on file  Occupational History   Not on file  Tobacco Use   Smoking status: Every Day    Current packs/day: 0.50    Types: Cigarettes   Smokeless tobacco: Never  Vaping Use   Vaping status: Never Used  Substance and Sexual Activity   Alcohol use: Yes    Alcohol/week: 6.0 standard drinks of alcohol    Types: 6 Cans of beer per week   Drug use: No   Sexual activity: Not on file  Other Topics Concern   Not on file  Social History Narrative   Lives in [place]; with wife. 2 children/near by. Smokes 1/2 ppd; 3-4 beers/weekend. Retired in 2010; Patent examiner.    Social Drivers of Corporate investment banker Strain: Low Risk   (02/10/2023)   Received from [facility]   Overall Financial Resource Strain (CARDIA)    Difficulty of Paying Living Expenses: Not hard at all  Food Insecurity: No Food Insecurity (02/10/2023)   Received from [facility]   Hunger Vital Sign    Worried About Running Out of Food in the Last Year: Never true    Ran Out of Food in the Last Year: Never true  Transportation Needs: No Transportation Needs (02/10/2023)   Received from [facility] - Transportation    In the past 12 months, has lack of transportation kept you from medical appointments or from getting medications?: No    Lack of Transportation (Non-Medical): No  Physical Activity: Not on file  Stress: Not on file  Social Connections: Not on file  Intimate Partner Violence: Not on file    FAMILY HISTORY: Family History  Problem Relation Age of Onset   Diabetes Mother    Colon cancer Mother    Diabetes Father     ALLERGIES:  has no known allergies.  MEDICATIONS:  Current Outpatient Medications  Medication Sig Dispense Refill   apixaban (ELIQUIS) 5 MG TABS tablet Take 1 tablet (5 mg total) by mouth 2 (two) times daily. 180 tablet 1   Artificial Tear Solution (SOOTHE XP) SOLN Place 1 drop into both eyes 3 (three) times daily as needed (dry eyes).     ascorbic acid (VITAMIN C) 500 MG tablet Take 500 mg by mouth daily.     Fe Bisgly-Vit C-Vit B12-FA (GENTLE IRON PO) Take 1 tablet by mouth in the morning and at bedtime.     finasteride (PROSCAR) 5 MG tablet Take 5 mg by mouth daily.     glimepiride (AMARYL) 4 MG tablet Take 4 mg by mouth in the morning and at bedtime.     hydrochlorothiazide (HYDRODIURIL) 25 MG tablet Take 25 mg by mouth daily.     Misc Natural Products (URINOZINC PLUS PO) Take 1 tablet by mouth 2 (two) times daily.      telmisartan (MICARDIS) 80 MG tablet Take 80 mg by mouth daily.     pioglitazone (ACTOS) 30 MG tablet Take by mouth.     No  current facility-administered medications for this visit.      PHYSICAL EXAMINATION:   Vitals:   07/23/23 0925  BP: (!) 117/59  Pulse: 65  Resp: 18  Temp: 98.4 F (36.9 C)  SpO2: 99%    Filed Weights   07/23/23 0925  Weight: 186 lb 9.6  oz (84.6 kg)     Physical Exam Vitals and nursing note reviewed.  HENT:     Head: Normocephalic and atraumatic.     Mouth/Throat:     Pharynx: Oropharynx is clear.  Eyes:     Extraocular Movements: Extraocular movements intact.     Pupils: Pupils are equal, round, and reactive to light.  Cardiovascular:     Rate and Rhythm: Normal rate and regular rhythm.  Pulmonary:     Comments: Decreased breath sounds bilaterally.  Abdominal:     Palpations: Abdomen is soft.  Musculoskeletal:        General: Normal range of motion.     Cervical back: Normal range of motion.  Skin:    General: Skin is warm.  Neurological:     General: No focal deficit present.     Mental Status: He is alert and oriented to person, place, and time.  Psychiatric:        Behavior: Behavior normal.        Judgment: Judgment normal.     LABORATORY DATA:  I have reviewed the data as listed Lab Results  Component Value Date   WBC 4.1 07/23/2023   HGB 10.9 (L) 07/23/2023   HCT 32.8 (L) 07/23/2023   MCV 101.5 (H) 07/23/2023   PLT 137 (L) 07/23/2023   Recent Labs    02/10/23 0818 05/25/23 1432 07/23/23 0914  NA 134* 136 134*  K 3.7 3.6 3.8  CL 102 102 102  CO2 26 25 24   GLUCOSE 111* 204* 157*  BUN 21 22 23   CREATININE 0.97 1.08 0.86  CALCIUM 9.4 9.3 9.2  GFRNONAA >60 >60 >60  PROT  --  7.7 7.4  ALBUMIN  --  4.0 3.7  AST  --  21 20  ALT  --  23 22  ALKPHOS  --  63 70  BILITOT  --  0.7 0.6     No results found.   Plasma cell dyscrasia # MULTIPLE MYELOMA: ELEVATED M protein:MARCH 2023- IgGK- 1.6 /dl; K/L= 33 [anemia-see above; no hypercalcemia no renal insufficiency; ?  Bone lesions-no x-rays done] OCT  2024- M protein- 1.7; K/L ratio: 57.  DEC 12th, Bone marrow Biopsy:  The bone marrow is hypercellular for age with increased number of atypical plasma cells representing 14%;  The background shows trilineage hematopoiesis with generally mild nonspecific changes.  Features  diagnostic of a myeloid neoplastic process are not present. Cytogenetic: FISH: Gain of 11/ q- standard risk. JAN 2025- PET scan- negative for any bone lesions.   # Consistent with diagnosis of multiple myeloma-given the mild - moderate anemia [see below]-without any evidence of any other endorgan damage-hypercalcemia or acute renal failure or bone lesions on PET scan.  Patient is not a candidate for stem cell transplant given his age/comorbidities.    # Discussed options of treatment with pills/Revlimid; injections Velcade/ dara to help treat the disease which is unfortunately incurable.  However newer treatments have shown deep and long remissions.  Discussed that if anemia does not improve status post infusion-or any other endorgan dysfunction noted would recommend starting chemotherapy.   # Mild- moderate anemia-multifactorial  iron deficiency/multiple myeloma-[October 2024-hemoglobin 11; iron saturation 36; ferritin-27];-status post Venofer hemoglobin today is 10.9.  Slightly improved.  Continue oral iron.  HOLD OFF iron infusion today.  # March 2022 [Dr.Sparks]-small volume PE/ RIGHT LE DVT- Positive for deep venous thrombosis in the right lower extremity [Above Knee- fem]; August 2022-Given recurrent DVT; prior history of PE--currently  on indefinite anticoagulation.  No Active Bleeding.  # Intermittent thrombocytopenia isolated  > 100- mild monitor for now-while on Eliquis. Stable.    # Hx o PVD [s/p stenting]- on eliquis-n [Dr.Dew]  # DISPOSITION: # HOLD venofer today # follow up in  10 weeks- 2 weeks prior-- MD; labs- - cbc/cmp; B12; MM panel; K/L light chains; iron studies; ferritin-/possible venofer;  Dr.B      All questions were answered. The patient  knows to call the clinic with any problems, questions or concerns.    Earna Coder, MD 07/23/2023 10:33 AM

## 2023-07-23 NOTE — Patient Instructions (Signed)

## 2023-07-24 LAB — BETA 2 MICROGLOBULIN, SERUM: Beta-2 Microglobulin: 2.2 mg/L (ref 0.6–2.4)

## 2023-07-26 LAB — KAPPA/LAMBDA LIGHT CHAINS
Kappa free light chain: 310.4 mg/L — ABNORMAL HIGH (ref 3.3–19.4)
Kappa, lambda light chain ratio: 50.89 — ABNORMAL HIGH (ref 0.26–1.65)
Lambda free light chains: 6.1 mg/L (ref 5.7–26.3)

## 2023-07-27 LAB — MULTIPLE MYELOMA PANEL, SERUM
Albumin SerPl Elph-Mcnc: 3.5 g/dL (ref 2.9–4.4)
Albumin/Glob SerPl: 1.1 (ref 0.7–1.7)
Alpha 1: 0.1 g/dL (ref 0.0–0.4)
Alpha2 Glob SerPl Elph-Mcnc: 0.8 g/dL (ref 0.4–1.0)
B-Globulin SerPl Elph-Mcnc: 0.8 g/dL (ref 0.7–1.3)
Gamma Glob SerPl Elph-Mcnc: 1.6 g/dL (ref 0.4–1.8)
Globulin, Total: 3.3 g/dL (ref 2.2–3.9)
IgA: 52 mg/dL — ABNORMAL LOW (ref 61–437)
IgG (Immunoglobin G), Serum: 1821 mg/dL — ABNORMAL HIGH (ref 603–1613)
IgM (Immunoglobulin M), Srm: 122 mg/dL (ref 15–143)
M Protein SerPl Elph-Mcnc: 1.2 g/dL — ABNORMAL HIGH
Total Protein ELP: 6.8 g/dL (ref 6.0–8.5)

## 2023-08-16 DIAGNOSIS — G245 Blepharospasm: Secondary | ICD-10-CM | POA: Diagnosis not present

## 2023-09-07 ENCOUNTER — Encounter (INDEPENDENT_AMBULATORY_CARE_PROVIDER_SITE_OTHER): Payer: Self-pay

## 2023-09-17 ENCOUNTER — Inpatient Hospital Stay: Attending: Internal Medicine

## 2023-09-17 DIAGNOSIS — E8809 Other disorders of plasma-protein metabolism, not elsewhere classified: Secondary | ICD-10-CM

## 2023-09-17 DIAGNOSIS — Z79899 Other long term (current) drug therapy: Secondary | ICD-10-CM | POA: Diagnosis not present

## 2023-09-17 DIAGNOSIS — D509 Iron deficiency anemia, unspecified: Secondary | ICD-10-CM | POA: Insufficient documentation

## 2023-09-17 DIAGNOSIS — C9000 Multiple myeloma not having achieved remission: Secondary | ICD-10-CM | POA: Insufficient documentation

## 2023-09-17 LAB — CMP (CANCER CENTER ONLY)
ALT: 25 U/L (ref 0–44)
AST: 26 U/L (ref 15–41)
Albumin: 4 g/dL (ref 3.5–5.0)
Alkaline Phosphatase: 69 U/L (ref 38–126)
Anion gap: 8 (ref 5–15)
BUN: 26 mg/dL — ABNORMAL HIGH (ref 8–23)
CO2: 23 mmol/L (ref 22–32)
Calcium: 9.1 mg/dL (ref 8.9–10.3)
Chloride: 103 mmol/L (ref 98–111)
Creatinine: 1.15 mg/dL (ref 0.61–1.24)
GFR, Estimated: >60 mL/min (ref >60)
Glucose, Bld: 146 mg/dL — ABNORMAL HIGH (ref 70–99)
Potassium: 3.8 mmol/L (ref 3.5–5.1)
Sodium: 134 mmol/L — ABNORMAL LOW (ref 135–145)
Total Bilirubin: 0.8 mg/dL (ref 0.0–1.2)
Total Protein: 8.1 g/dL (ref 6.5–8.1)

## 2023-09-17 LAB — CBC WITH DIFFERENTIAL (CANCER CENTER ONLY)
Abs Immature Granulocytes: 0.03 10*3/uL (ref 0.00–0.07)
Basophils Absolute: 0.1 10*3/uL (ref 0.0–0.1)
Basophils Relative: 2 %
Eosinophils Absolute: 0.1 10*3/uL (ref 0.0–0.5)
Eosinophils Relative: 4 %
HCT: 34.6 % — ABNORMAL LOW (ref 39.0–52.0)
Hemoglobin: 11.5 g/dL — ABNORMAL LOW (ref 13.0–17.0)
Immature Granulocytes: 1 %
Lymphocytes Relative: 33 %
Lymphs Abs: 1.1 10*3/uL (ref 0.7–4.0)
MCH: 33.4 pg (ref 26.0–34.0)
MCHC: 33.2 g/dL (ref 30.0–36.0)
MCV: 100.6 fL — ABNORMAL HIGH (ref 80.0–100.0)
Monocytes Absolute: 0.4 10*3/uL (ref 0.1–1.0)
Monocytes Relative: 11 %
Neutro Abs: 1.6 10*3/uL — ABNORMAL LOW (ref 1.7–7.7)
Neutrophils Relative %: 49 %
Platelet Count: 158 10*3/uL (ref 150–400)
RBC: 3.44 MIL/uL — ABNORMAL LOW (ref 4.22–5.81)
RDW: 14.5 % (ref 11.5–15.5)
WBC Count: 3.3 10*3/uL — ABNORMAL LOW (ref 4.0–10.5)
nRBC: 0 % (ref 0.0–0.2)

## 2023-09-17 LAB — IRON AND TIBC
Iron: 180 ug/dL (ref 45–182)
Saturation Ratios: 45 % — ABNORMAL HIGH (ref 17.9–39.5)
TIBC: 396 ug/dL (ref 250–450)
UIBC: 216 ug/dL

## 2023-09-17 LAB — FERRITIN: Ferritin: 33 ng/mL (ref 24–336)

## 2023-09-17 LAB — VITAMIN B12: Vitamin B-12: 669 pg/mL (ref 180–914)

## 2023-09-20 LAB — MULTIPLE MYELOMA PANEL, SERUM
Albumin SerPl Elph-Mcnc: 3.8 g/dL (ref 2.9–4.4)
Albumin/Glob SerPl: 1.1 (ref 0.7–1.7)
Alpha 1: 0.1 g/dL (ref 0.0–0.4)
Alpha2 Glob SerPl Elph-Mcnc: 0.8 g/dL (ref 0.4–1.0)
B-Globulin SerPl Elph-Mcnc: 0.9 g/dL (ref 0.7–1.3)
Gamma Glob SerPl Elph-Mcnc: 1.8 g/dL (ref 0.4–1.8)
Globulin, Total: 3.7 g/dL (ref 2.2–3.9)
IgA: 55 mg/dL — ABNORMAL LOW (ref 61–437)
IgG (Immunoglobin G), Serum: 1983 mg/dL — ABNORMAL HIGH (ref 603–1613)
IgM (Immunoglobulin M), Srm: 126 mg/dL (ref 15–143)
M Protein SerPl Elph-Mcnc: 1.5 g/dL — ABNORMAL HIGH
Total Protein ELP: 7.5 g/dL (ref 6.0–8.5)

## 2023-09-20 LAB — KAPPA/LAMBDA LIGHT CHAINS
Kappa free light chain: 337.5 mg/L — ABNORMAL HIGH (ref 3.3–19.4)
Kappa, lambda light chain ratio: 37.09 — ABNORMAL HIGH (ref 0.26–1.65)
Lambda free light chains: 9.1 mg/L (ref 5.7–26.3)

## 2023-10-01 ENCOUNTER — Encounter: Payer: Self-pay | Admitting: Internal Medicine

## 2023-10-01 ENCOUNTER — Inpatient Hospital Stay: Attending: Internal Medicine | Admitting: Internal Medicine

## 2023-10-01 ENCOUNTER — Inpatient Hospital Stay

## 2023-10-01 VITALS — BP 122/53 | HR 65 | Temp 97.7°F | Resp 18 | Ht 69.0 in | Wt 214.8 lb

## 2023-10-01 DIAGNOSIS — Z86718 Personal history of other venous thrombosis and embolism: Secondary | ICD-10-CM | POA: Insufficient documentation

## 2023-10-01 DIAGNOSIS — D696 Thrombocytopenia, unspecified: Secondary | ICD-10-CM | POA: Insufficient documentation

## 2023-10-01 DIAGNOSIS — Z79899 Other long term (current) drug therapy: Secondary | ICD-10-CM | POA: Diagnosis not present

## 2023-10-01 DIAGNOSIS — Z86711 Personal history of pulmonary embolism: Secondary | ICD-10-CM | POA: Diagnosis not present

## 2023-10-01 DIAGNOSIS — D649 Anemia, unspecified: Secondary | ICD-10-CM | POA: Insufficient documentation

## 2023-10-01 DIAGNOSIS — C9000 Multiple myeloma not having achieved remission: Secondary | ICD-10-CM | POA: Insufficient documentation

## 2023-10-01 DIAGNOSIS — D472 Monoclonal gammopathy: Secondary | ICD-10-CM

## 2023-10-01 DIAGNOSIS — Z7901 Long term (current) use of anticoagulants: Secondary | ICD-10-CM | POA: Insufficient documentation

## 2023-10-01 DIAGNOSIS — E8809 Other disorders of plasma-protein metabolism, not elsewhere classified: Secondary | ICD-10-CM

## 2023-10-01 NOTE — Assessment & Plan Note (Addendum)
#   MULTIPLE MYELOMA: ELEVATED M protein:MARCH 2023- IgGK- 1.6 /dl; K/L= 33 [anemia-see above; no hypercalcemia no renal insufficiency; ?  Bone lesions-no x-rays done] OCT  2024- M protein- 1.7; K/L ratio: 57. DEC 12th, Bone marrow Biopsy:  The bone marrow is hypercellular for age with increased number of atypical plasma cells representing 14%;  The background shows trilineage hematopoiesis with generally mild nonspecific changes.  Features  diagnostic of a myeloid neoplastic process are not present. Cytogenetic: FISH: Gain of 11/ q- standard risk. JAN 2025- PET scan- negative for any bone lesions.   # Again reviewed that patient's diagnosis is consistent with diagnosis of multiple myeloma-given the mild - moderate anemia [see below]-without any evidence of any other endorgan damage-hypercalcemia or acute renal failure or bone lesions on PET scan.  Patient is not a candidate for stem cell transplant given his age/comorbidities.    # May 2025-M protein 1.5 g/dL kappa lambda light chain ratio 40-mild anemia no renal insufficiency or hypercalcemia-clinically stable/myeloma labs slowly progressing over time.  Given patient preference continue surveillance.  Again reviewed the treatment options include pills shots.   # Mild- moderate anemia-multifactorial  iron  deficiency/multiple myeloma-June 2025-hemoglobin 11; iron  saturation 45; ferritin-33];-status post Venofer  hemoglobin today is around 11 slightly improved.  Continue oral iron .  HOLD OFF iron  infusion today.  # March 2022 [Dr.Sparks]-small volume PE/ RIGHT LE DVT- Positive for deep venous thrombosis in the right lower extremity [Above Knee- fem]; August 2022-Given recurrent DVT; prior history of PE--currently on indefinite anticoagulation.  No Active Bleeding.- stable  # Intermittent thrombocytopenia isolated  > 100- mild monitor for now-while on Eliquis . Stable.    # Hx o PVD [s/p stenting]- on eliquis -n [Dr.Dew]  # DISPOSITION: # HOLD venofer   today # follow up in 3 months-  2 weeks prior-- MD; labs- - cbc/cmp; B12; MM panel; K/L light chains; iron  studies; ferritin-/possible venofer ;  Dr.B

## 2023-10-01 NOTE — Progress Notes (Signed)
 No concerns today

## 2023-10-01 NOTE — Progress Notes (Signed)
 I connected with Jonathon Barrera on 10/01/23 at  9:30 AM EDT by video enabled telemedicine visit and verified that I am speaking with the correct person using two identifiers.  I discussed the limitations, risks, security and privacy concerns of performing an evaluation and management service by telemedicine and the availability of in-person appointments. I also discussed with the patient that there may be a patient responsible charge related to this service. The patient expressed understanding and agreed to proceed.    Other persons participating in the visit and their role in the encounter: RN/medical reconciliation Patient's location: office Provider's location: home  Oncology History   No history exists.    Chief Complaint: Multiple Myeloma    History of present illness:Jonathon Barrera 83 y.o.  male with history of multiple myeloma/mild anemia-; A.fib on eliquis  currently on surveillance is here for follow-up/review results of the blood work.  Patient denies any new symptoms.  Denies any nausea vomiting.  Any fevers or chills.  No worsening neuropathy.  He continues to be compliant with oral iron . NO falls.   No blood in stools.   Observation/objective: Alert & oriented x 3. In No acute distress.   Assessment and plan: Plasma cell dyscrasia # MULTIPLE MYELOMA: ELEVATED M protein:MARCH 2023- IgGK- 1.6 /dl; K/L= 33 [anemia-see above; no hypercalcemia no renal insufficiency; ?  Bone lesions-no x-rays done] OCT  2024- M protein- 1.7; K/L ratio: 57. DEC 12th, Bone marrow Biopsy:  The bone marrow is hypercellular for age with increased number of atypical plasma cells representing 14%;  The background shows trilineage hematopoiesis with generally mild nonspecific changes.  Features  diagnostic of a myeloid neoplastic process are not present. Cytogenetic: FISH: Gain of 11/ q- standard risk. JAN 2025- PET scan- negative for any bone lesions.   # Again reviewed that patient's diagnosis is  consistent with diagnosis of multiple myeloma-given the mild - moderate anemia [see below]-without any evidence of any other endorgan damage-hypercalcemia or acute renal failure or bone lesions on PET scan.  Patient is not a candidate for stem cell transplant given his age/comorbidities.    # May 2025-M protein 1.5 g/dL kappa lambda light chain ratio 40-mild anemia no renal insufficiency or hypercalcemia-clinically stable/myeloma labs slowly progressing over time.  Given patient preference continue surveillance.  Again reviewed the treatment options include pills shots.   # Mild- moderate anemia-multifactorial  iron  deficiency/multiple myeloma-June 2025-hemoglobin 11; iron  saturation 45; ferritin-33];-status post Venofer  hemoglobin today is around 11 slightly improved.  Continue oral iron .  HOLD OFF iron  infusion today.  # March 2022 [Dr.Sparks]-small volume PE/ RIGHT LE DVT- Positive for deep venous thrombosis in the right lower extremity [Above Knee- fem]; August 2022-Given recurrent DVT; prior history of PE--currently on indefinite anticoagulation.  No Active Bleeding.- stable  # Intermittent thrombocytopenia isolated  > 100- mild monitor for now-while on Eliquis . Stable.    # Hx o PVD [s/p stenting]- on eliquis -n [Dr.Dew]  # DISPOSITION: # HOLD venofer  today # follow up in 3 months-  2 weeks prior-- MD; labs- - cbc/cmp; B12; MM panel; K/L light chains; iron  studies; ferritin-/possible venofer ;  Dr.B   Follow-up instructions:  I discussed the assessment and treatment plan with the patient.  The patient was provided an opportunity to ask questions and all were answered.  The patient agreed with the plan and demonstrated understanding of instructions.  The patient was advised to call back or seek an in person evaluation if the symptoms worsen or if the condition fails  to improve as anticipated.    Dr. Hedda Liv [facility] at [facility] 10/01/2023 10:01 AM

## 2023-10-20 DIAGNOSIS — I10 Essential (primary) hypertension: Secondary | ICD-10-CM | POA: Diagnosis not present

## 2023-10-20 DIAGNOSIS — J431 Panlobular emphysema: Secondary | ICD-10-CM | POA: Diagnosis not present

## 2023-10-20 DIAGNOSIS — E118 Type 2 diabetes mellitus with unspecified complications: Secondary | ICD-10-CM | POA: Diagnosis not present

## 2023-10-20 DIAGNOSIS — D500 Iron deficiency anemia secondary to blood loss (chronic): Secondary | ICD-10-CM | POA: Diagnosis not present

## 2023-10-20 DIAGNOSIS — Z125 Encounter for screening for malignant neoplasm of prostate: Secondary | ICD-10-CM | POA: Diagnosis not present

## 2023-10-20 DIAGNOSIS — N401 Enlarged prostate with lower urinary tract symptoms: Secondary | ICD-10-CM | POA: Diagnosis not present

## 2023-10-20 DIAGNOSIS — I2699 Other pulmonary embolism without acute cor pulmonale: Secondary | ICD-10-CM | POA: Diagnosis not present

## 2023-10-20 DIAGNOSIS — Z79899 Other long term (current) drug therapy: Secondary | ICD-10-CM | POA: Diagnosis not present

## 2023-10-20 DIAGNOSIS — E782 Mixed hyperlipidemia: Secondary | ICD-10-CM | POA: Diagnosis not present

## 2023-11-11 DIAGNOSIS — M25512 Pain in left shoulder: Secondary | ICD-10-CM | POA: Diagnosis not present

## 2023-12-03 ENCOUNTER — Encounter (INDEPENDENT_AMBULATORY_CARE_PROVIDER_SITE_OTHER): Payer: Self-pay | Admitting: Vascular Surgery

## 2023-12-03 ENCOUNTER — Ambulatory Visit (INDEPENDENT_AMBULATORY_CARE_PROVIDER_SITE_OTHER): Payer: PPO

## 2023-12-03 ENCOUNTER — Ambulatory Visit (INDEPENDENT_AMBULATORY_CARE_PROVIDER_SITE_OTHER): Payer: PPO | Admitting: Vascular Surgery

## 2023-12-03 VITALS — BP 104/66 | HR 73 | Resp 16 | Wt 180.6 lb

## 2023-12-03 DIAGNOSIS — E118 Type 2 diabetes mellitus with unspecified complications: Secondary | ICD-10-CM

## 2023-12-03 DIAGNOSIS — I70211 Atherosclerosis of native arteries of extremities with intermittent claudication, right leg: Secondary | ICD-10-CM

## 2023-12-03 DIAGNOSIS — I10 Essential (primary) hypertension: Secondary | ICD-10-CM | POA: Diagnosis not present

## 2023-12-03 NOTE — Assessment & Plan Note (Signed)
 Right ABIs continue to improve and is now up to 0.90 on the right.  The waveform remains monophasic.  His left ABI is stable at 1.13.  He has done very well over the past 2 to 3 years with increasing his exercise regimen and lifestyle modifications.  His walking distances have dramatically improved.  He has a known right SFA occlusion that we were unable to treat endovascularly after previous interventions.  He is already had a femoral endarterectomy on the right side as well.  At this point, I would certainly not recommend any surgical therapy which would be what we would offer if he were to have any sort of limb threatening ischemic symptoms.  This would be a femoral to popliteal/distal bypass.  I would recommend continued exercise and no changes in his medical regimen.  We will see him on an annual basis at this point.

## 2023-12-03 NOTE — Progress Notes (Signed)
 MRN : [id number]  Jonathon Barrera is a 83 y.o. (Nov 29, 1940) male who presents with chief complaint of  Chief Complaint  Patient presents with   Follow-up    6 month abi follow up  .  History of Present Illness: Patient returns today in follow up of his peripheral arterial disease.  He has an extensive peripheral vascular history with multiple previous interventions and a right common femoral endarterectomy.  He has had a known SFA occlusion after his surgery that we have not been able to open percutaneously and for the last couple of years he has done increasing exercise and medical therapy.  He is done very well with this.  He has no limb threatening symptoms such as rest pain or ulceration.  He does still have claudication symptoms but his walking distances have improved. Right ABIs continue to improve and is now up to 0.90 on the right.  The waveform remains monophasic.  His left ABI is stable at 1.13.   Current Outpatient Medications  Medication Sig Dispense Refill   apixaban (ELIQUIS) 5 MG TABS tablet Take 1 tablet (5 mg total) by mouth 2 (two) times daily. 180 tablet 1   Artificial Tear Solution (SOOTHE XP) SOLN Place 1 drop into both eyes 3 (three) times daily as needed (dry eyes).     Fe Bisgly-Vit C-Vit B12-FA (GENTLE IRON PO) Take 1 tablet by mouth in the morning and at bedtime.     finasteride (PROSCAR) 5 MG tablet Take 5 mg by mouth daily.     glimepiride (AMARYL) 4 MG tablet Take 4 mg by mouth in the morning and at bedtime.     hydrochlorothiazide (HYDRODIURIL) 25 MG tablet Take 25 mg by mouth daily.     Misc Natural Products (URINOZINC PLUS PO) Take 1 tablet by mouth 2 (two) times daily.      pioglitazone (ACTOS) 30 MG tablet Take 30 mg by mouth daily.     telmisartan (MICARDIS) 80 MG tablet Take 80 mg by mouth daily.     ascorbic acid (VITAMIN C) 500 MG tablet Take 500 mg by mouth daily. (Patient not taking: Reported on 12/03/2023)     No current facility-administered  medications for this visit.    Past Medical History:  Diagnosis Date   Actinic keratosis    Acute deep vein thrombosis (DVT) of distal vein of right lower extremity (HCC) 06/19/2020   Aortic atherosclerosis (HCC)    Bladder carcinoma (HCC) 05/26/2007   a.) papillary-transitional cell   BPH (benign prostatic hyperplasia)    Colon polyps    COPD (chronic obstructive pulmonary disease) (HCC)    Current use of long term anticoagulation    a.) Apixaban   Diverticulosis    DOE (dyspnea on exertion)    History of SCC (squamous cell carcinoma) of skin 01/06/2023   left deltoid  ED&C done 01/06/2023   HOH (hard of hearing)    a.) uses BILATERAL assistive devices   Hyperlipemia    Hypertension    Mass    Lt lower cheek   Melanoma (HCC) 2000   Tx by Dr Charlott   PAD (peripheral artery disease) [facility])    Peripheral vascular disease (HCC)    Pulmonary embolism (HCC) 06/19/2020   a.) RLL; no associated RIGHT heart strain   RBBB (right bundle branch block)    Squamous cell carcinoma in situ (SCCIS) 06/08/2023   left infra axillary txt with ED&C   Squamous cell carcinoma of skin 02/01/2017  Left dorsum latera. hand. EDC   Squamous cell carcinoma of skin 02/01/2017   Left dorsum base of thumb. EDC   T2DM (type 2 diabetes mellitus) (HCC)     Past Surgical History:  Procedure Laterality Date   back skin cancer     BLADDER SURGERY     CARDIAC CATHETERIZATION     CATARACT EXTRACTION W/PHACO Right 04/30/2015   Procedure: CATARACT EXTRACTION PHACO AND INTRAOCULAR LENS PLACEMENT (IOC);  Surgeon: Elsie Carmine, MD;  Location: [facility] ORS;  Service: Ophthalmology;  Laterality: Right;  US  01:03 AP% 21.4 CDE  13.57 fluid pack lot # [id number]   CATARACT EXTRACTION W/PHACO Left 05/21/2015   Procedure: CATARACT EXTRACTION PHACO AND INTRAOCULAR LENS PLACEMENT (IOC);  Surgeon: Elsie Carmine, MD;  Location: [facility] ORS;  Service: Ophthalmology;  Laterality: Left;  US  00:52 AP% 18.9 CDE 9.95 fluid  pack lot #[id number]   COLONOSCOPY WITH PROPOFOL N/A 11/05/2014   Procedure: COLONOSCOPY WITH PROPOFOL;  Surgeon: Deward CINDERELLA Piedmont, MD;  Location: [facility] ENDOSCOPY;  Service: Gastroenterology;  Laterality: N/A;   COLONOSCOPY WITH PROPOFOL N/A 05/19/2021   Procedure: COLONOSCOPY WITH PROPOFOL;  Surgeon: Onita Elspeth Sharper, DO;  Location: [facility] ENDOSCOPY;  Service: Gastroenterology;  Laterality: N/A;  DM Patient is hard of hearing and would like for wife to answer questions, please.   ENDARTERECTOMY FEMORAL Right 02/26/2021   Procedure: ENDARTERECTOMY FEMORAL (SFA STENT PLACEMEN);  Surgeon: Marea Selinda RAMAN, MD;  Location: [facility] ORS;  Service: Vascular;  Laterality: Right;   ESOPHAGOGASTRODUODENOSCOPY (EGD) WITH PROPOFOL N/A 05/19/2021   Procedure: ESOPHAGOGASTRODUODENOSCOPY (EGD) WITH PROPOFOL;  Surgeon: Onita Elspeth Sharper, DO;  Location: [facility] ENDOSCOPY;  Service: Gastroenterology;  Laterality: N/A;   HERNIA REPAIR     inguinal   IR BONE MARROW BIOPSY & ASPIRATION  03/23/2023   KYPHOPLASTY N/A 02/23/2019   Procedure: T12 KYPHOPLASTY;  Surgeon: Kathlynn Sharper, MD;  Location: [facility] ORS;  Service: Orthopedics;  Laterality: N/A;   LESION EXCISION N/A 02/23/2018   Procedure: EXCISION SCALP CYST AND FACIAL CYST;  Surgeon: Rodolph Romano, MD;  Location: [facility] ORS;  Service: General;  Laterality: N/A;   LOWER EXTREMITY ANGIOGRAPHY Right 01/23/2021   Procedure: LOWER EXTREMITY ANGIOGRAPHY;  Surgeon: Marea Selinda RAMAN, MD;  Location: [facility] INVASIVE CV LAB;  Service: Cardiovascular;  Laterality: Right;   LOWER EXTREMITY ANGIOGRAPHY Right 01/12/2022   Procedure: Lower Extremity Angiography;  Surgeon: Marea Selinda RAMAN, MD;  Location: [facility] INVASIVE CV LAB;  Service: Cardiovascular;  Laterality: Right;   LOWER EXTREMITY ANGIOGRAPHY Right 01/26/2022   Procedure: Lower Extremity Angiography;  Surgeon: Marea Selinda RAMAN, MD;  Location: [facility] INVASIVE CV LAB;  Service: Cardiovascular;  Laterality: Right;   SHOULDER ARTHROSCOPY W/ ROTATOR CUFF  REPAIR Right    TUR-BT       Social History   Tobacco Use   Smoking status: Every Day    Current packs/day: 0.50    Types: Cigarettes   Smokeless tobacco: Never  Vaping Use   Vaping status: Never Used  Substance Use Topics   Alcohol use: Yes    Alcohol/week: 6.0 standard drinks of alcohol    Types: 6 Cans of beer per week   Drug use: No      Family History  Problem Relation Age of Onset   Diabetes Mother    Colon cancer Mother    Diabetes Father      No Known Allergies   REVIEW OF SYSTEMS (Negative unless checked)   Constitutional: [] Weight loss  [] Fever  [] Chills Cardiac: [] Chest pain   []   Chest pressure   [] Palpitations   [] Shortness of breath when laying flat   [] Shortness of breath at rest   [] Shortness of breath with exertion. Vascular:  [x] Pain in legs with walking   [] Pain in legs at rest   [] Pain in legs when laying flat   [x] Claudication   [] Pain in feet when walking  [] Pain in feet at rest  [] Pain in feet when laying flat   [] History of DVT   [] Phlebitis   [] Swelling in legs   [] Varicose veins   [] Non-healing ulcers Pulmonary:   [] Uses home oxygen   [] Productive cough   [] Hemoptysis   [] Wheeze  [] COPD   [] Asthma Neurologic:  [] Dizziness  [] Blackouts   [] Seizures   [] History of stroke   [] History of TIA  [] Aphasia   [] Temporary blindness   [] Dysphagia   [] Weakness or numbness in arms   [] Weakness or numbness in legs Musculoskeletal:  [x] Arthritis   [] Joint swelling   [] Joint pain   [] Low back pain Hematologic:  [] Easy bruising  [] Easy bleeding   [] Hypercoagulable state   [] Anemic   Gastrointestinal:  [] Blood in stool   [] Vomiting blood  [] Gastroesophageal reflux/heartburn   [] Abdominal pain Genitourinary:  [] Chronic kidney disease   [] Difficult urination  [] Frequent urination  [] Burning with urination   [] Hematuria Skin:  [] Rashes   [] Ulcers   [] Wounds Psychological:  [] History of anxiety   []  History of major depression.  Physical Examination  BP 104/66    Pulse 73   Resp 16   Wt 180 lb 9.6 oz (81.9 kg)   BMI 26.67 kg/m  Gen:  WD/WN, NAD Head: [place]/AT, No temporalis wasting. Ear/Nose/Throat: Hearing grossly intact, nares w/o erythema or drainage Eyes: Conjunctiva clear. Sclera non-icteric Neck: Supple.  Trachea midline Pulmonary:  Good air movement, no use of accessory muscles.  Cardiac: RRR, no JVD Vascular:  Vessel Right Left  Radial Palpable Palpable                          PT 1+ Palpable 1+ Palpable  DP 1+ Palpable 2+ Palpable   Gastrointestinal: soft, non-tender/non-distended. No guarding/reflex.  Musculoskeletal: M/S 5/5 throughout.  No deformity or atrophy. No edema. Neurologic: Sensation grossly intact in extremities.  Symmetrical.  Speech is fluent.  Psychiatric: Judgment intact, Mood & affect appropriate for pt's clinical situation. Dermatologic: No rashes or ulcers noted.  No cellulitis or open wounds.      Labs Recent Results (from the past 2160 hours)  CBC with Differential (Cancer Center Only)     Status: Abnormal   Collection Time: 09/17/23  8:13 AM  Result Value Ref Range   WBC Count 3.3 (L) 4.0 - 10.5 K/uL   RBC 3.44 (L) 4.22 - 5.81 MIL/uL   Hemoglobin 11.5 (L) 13.0 - 17.0 g/dL   HCT 65.3 (L) 60.9 - 47.9 %   MCV 100.6 (H) 80.0 - 100.0 fL   MCH 33.4 26.0 - 34.0 pg   MCHC 33.2 30.0 - 36.0 g/dL   RDW 85.4 88.4 - 84.4 %   Platelet Count 158 150 - 400 K/uL   nRBC 0.0 0.0 - 0.2 %   Neutrophils Relative % 49 %   Neutro Abs 1.6 (L) 1.7 - 7.7 K/uL   Lymphocytes Relative 33 %   Lymphs Abs 1.1 0.7 - 4.0 K/uL   Monocytes Relative 11 %   Monocytes Absolute 0.4 0.1 - 1.0 K/uL   Eosinophils Relative 4 %   Eosinophils Absolute 0.1  0.0 - 0.5 K/uL   Basophils Relative 2 %   Basophils Absolute 0.1 0.0 - 0.1 K/uL   Immature Granulocytes 1 %   Abs Immature Granulocytes 0.03 0.00 - 0.07 K/uL    Comment: Performed at [facility], [address] [place], [place] [address]  CMP (Cancer Center only)      Status: Abnormal   Collection Time: 09/17/23  8:13 AM  Result Value Ref Range   Sodium 134 (L) 135 - 145 mmol/L   Potassium 3.8 3.5 - 5.1 mmol/L   Chloride 103 98 - 111 mmol/L   CO2 23 22 - 32 mmol/L   Glucose, Bld 146 (H) 70 - 99 mg/dL    Comment: Glucose reference range applies only to samples taken after fasting for at least 8 hours.   BUN 26 (H) 8 - 23 mg/dL   Creatinine 8.84 9.38 - 1.24 mg/dL   Calcium 9.1 8.9 - 89.6 mg/dL   Total Protein 8.1 6.5 - 8.1 g/dL   Albumin 4.0 3.5 - 5.0 g/dL   AST 26 15 - 41 U/L   ALT 25 0 - 44 U/L   Alkaline Phosphatase 69 38 - 126 U/L   Total Bilirubin 0.8 0.0 - 1.2 mg/dL   GFR, Estimated >39 >39 mL/min    Comment: (NOTE) Calculated using the CKD-EPI Creatinine Equation (2021)    Anion gap 8 5 - 15    Comment: Performed at [facility], [address] [place], [place] [address]  Multiple Myeloma Panel (SPEP&IFE w/QIG)     Status: Abnormal   Collection Time: 09/17/23  8:13 AM  Result Value Ref Range   IgG (Immunoglobin G), Serum 1,983 (H) 603 - 1,613 mg/dL   IgA 55 (L) 61 - 562 mg/dL   IgM (Immunoglobulin M), Srm 126 15 - 143 mg/dL   Total Protein ELP 7.5 6.0 - 8.5 g/dL   Albumin SerPl Elph-Mcnc 3.8 2.9 - 4.4 g/dL   Alpha 1 0.1 0.0 - 0.4 g/dL   Alpha2 Glob SerPl Elph-Mcnc 0.8 0.4 - 1.0 g/dL   B-Globulin SerPl Elph-Mcnc 0.9 0.7 - 1.3 g/dL   Gamma Glob SerPl Elph-Mcnc 1.8 0.4 - 1.8 g/dL   M Protein SerPl Elph-Mcnc 1.5 (H) Not Observed g/dL   Globulin, Total 3.7 2.2 - 3.9 g/dL   Albumin/Glob SerPl 1.1 0.7 - 1.7   IFE 1 Comment (A)     Comment: (NOTE) Immunofixation shows IgG monoclonal protein with kappa light chain specificity. PLEASE NOTE: Samples from patients receiving DARZALEX(R) (daratumumab) or SARCLISA(R)(isatuximab-irfc) treatment can appear as an IgG kappa and mask a complete response (CR). If this patient is receiving these therapies, this IFE assay interference can be removed by ordering test number  [id number]-Immunofixation, Daratumumab-Specific, Serum or [id number]-Immunofixation, Isatuximab-Specific, Serum and submitting a new sample for testing or by calling the lab to add this test to the current sample.    Please Note Comment     Comment: (NOTE) Protein electrophoresis scan will follow via computer, mail, or courier delivery. Performed At: [facility] [address] [place], [place] [id number] Jennette Shorter MD Ey:[id number]   Kappa/lambda light chains     Status: Abnormal   Collection Time: 09/17/23  8:13 AM  Result Value Ref Range   Kappa free light chain 337.5 (H) 3.3 - 19.4 mg/L   Lambda free light chains 9.1 5.7 - 26.3 mg/L   Kappa, lambda light chain ratio 37.09 (H) 0.26 - 1.65    Comment: (NOTE) Performed At: [facility] [address] [place], [place] [id number] Jennette Shorter MD Ey:[id number]   Iron and TIBC     Status: Abnormal   Collection Time: 09/17/23  8:13 AM  Result Value Ref Range   Iron 180 45 - 182 ug/dL   TIBC 603 749 - 549 ug/dL   Saturation Ratios 45 (H) 17.9 - 39.5 %   UIBC 216 ug/dL    Comment: Performed at [facility], [address] [place], [place] [address]  Ferritin     Status: None   Collection Time: 09/17/23  8:13 AM  Result Value Ref Range   Ferritin 33 24 - 336 ng/mL    Comment: Performed at [facility], [address] [place], [place] [address]  Vitamin B12     Status: None   Collection Time: 09/17/23  8:14 AM  Result Value Ref Range   Vitamin B-12 669 180 - 914 pg/mL    Comment: (NOTE) This assay is not validated for testing neonatal or myeloproliferative syndrome specimens for Vitamin B12 levels. Performed at [facility], [address] [place], [place] [address]     Radiology No results found.  Assessment/Plan  Atherosclerosis of native arteries of extremity with intermittent claudication (HCC) Right ABIs continue to improve and is now up to 0.90 on the right.  The  waveform remains monophasic.  His left ABI is stable at 1.13.  He has done very well over the past 2 to 3 years with increasing his exercise regimen and lifestyle modifications.  His walking distances have dramatically improved.  He has a known right SFA occlusion that we were unable to treat endovascularly after previous interventions.  He is already had a femoral endarterectomy on the right side as well.  At this point, I would certainly not recommend any surgical therapy which would be what we would offer if he were to have any sort of limb threatening ischemic symptoms.  This would be a femoral to popliteal/distal bypass.  I would recommend continued exercise and no changes in his medical regimen.  We will see him on an annual basis at this point.  Hypertension blood pressure control important in reducing the progression of atherosclerotic disease. On appropriate oral medications.     Type II diabetes mellitus with complication (HCC) blood glucose control important in reducing the progression of atherosclerotic disease. Also, involved in wound healing. On appropriate medications.  Selinda Gu, MD  12/03/2023 9:52 AM    This note was created with Dragon medical transcription system.  Any errors from dictation are purely unintentional

## 2023-12-06 LAB — VAS US ABI WITH/WO TBI
Left ABI: 1.13
Right ABI: 0.9

## 2023-12-17 ENCOUNTER — Inpatient Hospital Stay: Attending: Internal Medicine

## 2023-12-17 DIAGNOSIS — Z79899 Other long term (current) drug therapy: Secondary | ICD-10-CM | POA: Insufficient documentation

## 2023-12-17 DIAGNOSIS — E8809 Other disorders of plasma-protein metabolism, not elsewhere classified: Secondary | ICD-10-CM

## 2023-12-17 DIAGNOSIS — C9000 Multiple myeloma not having achieved remission: Secondary | ICD-10-CM | POA: Insufficient documentation

## 2023-12-17 LAB — CBC WITH DIFFERENTIAL (CANCER CENTER ONLY)
Abs Immature Granulocytes: 0.03 K/uL (ref 0.00–0.07)
Basophils Absolute: 0.1 K/uL (ref 0.0–0.1)
Basophils Relative: 2 %
Eosinophils Absolute: 0.2 K/uL (ref 0.0–0.5)
Eosinophils Relative: 4 %
HCT: 33.1 % — ABNORMAL LOW (ref 39.0–52.0)
Hemoglobin: 10.6 g/dL — ABNORMAL LOW (ref 13.0–17.0)
Immature Granulocytes: 1 %
Lymphocytes Relative: 28 %
Lymphs Abs: 1.1 K/uL (ref 0.7–4.0)
MCH: 33.5 pg (ref 26.0–34.0)
MCHC: 32 g/dL (ref 30.0–36.0)
MCV: 104.7 fL — ABNORMAL HIGH (ref 80.0–100.0)
Monocytes Absolute: 0.4 K/uL (ref 0.1–1.0)
Monocytes Relative: 10 %
Neutro Abs: 2.1 K/uL (ref 1.7–7.7)
Neutrophils Relative %: 55 %
Platelet Count: 181 K/uL (ref 150–400)
RBC: 3.16 MIL/uL — ABNORMAL LOW (ref 4.22–5.81)
RDW: 14.6 % (ref 11.5–15.5)
WBC Count: 3.8 K/uL — ABNORMAL LOW (ref 4.0–10.5)
nRBC: 0 % (ref 0.0–0.2)

## 2023-12-17 LAB — IRON AND TIBC
Iron: 178 ug/dL (ref 45–182)
Saturation Ratios: 47 % — ABNORMAL HIGH (ref 17.9–39.5)
TIBC: 377 ug/dL (ref 250–450)
UIBC: 199 ug/dL

## 2023-12-17 LAB — CMP (CANCER CENTER ONLY)
ALT: 21 U/L (ref 0–44)
AST: 27 U/L (ref 15–41)
Albumin: 3.9 g/dL (ref 3.5–5.0)
Alkaline Phosphatase: 66 U/L (ref 38–126)
Anion gap: 7 (ref 5–15)
BUN: 26 mg/dL — ABNORMAL HIGH (ref 8–23)
CO2: 26 mmol/L (ref 22–32)
Calcium: 9.3 mg/dL (ref 8.9–10.3)
Chloride: 104 mmol/L (ref 98–111)
Creatinine: 1.06 mg/dL (ref 0.61–1.24)
GFR, Estimated: >60 mL/min (ref >60)
Glucose, Bld: 85 mg/dL (ref 70–99)
Potassium: 3.6 mmol/L (ref 3.5–5.1)
Sodium: 137 mmol/L (ref 135–145)
Total Bilirubin: 0.6 mg/dL (ref 0.0–1.2)
Total Protein: 7.8 g/dL (ref 6.5–8.1)

## 2023-12-17 LAB — VITAMIN B12: Vitamin B-12: 580 pg/mL (ref 180–914)

## 2023-12-17 LAB — FERRITIN: Ferritin: 20 ng/mL — ABNORMAL LOW (ref 24–336)

## 2023-12-21 LAB — KAPPA/LAMBDA LIGHT CHAINS
Kappa free light chain: 338.8 mg/L — ABNORMAL HIGH (ref 3.3–19.4)
Kappa, lambda light chain ratio: 49.82 — ABNORMAL HIGH (ref 0.26–1.65)
Lambda free light chains: 6.8 mg/L (ref 5.7–26.3)

## 2023-12-22 LAB — MULTIPLE MYELOMA PANEL, SERUM
Albumin SerPl Elph-Mcnc: 3.5 g/dL (ref 2.9–4.4)
Albumin/Glob SerPl: 1 (ref 0.7–1.7)
Alpha 1: 0.2 g/dL (ref 0.0–0.4)
Alpha2 Glob SerPl Elph-Mcnc: 0.8 g/dL (ref 0.4–1.0)
B-Globulin SerPl Elph-Mcnc: 1 g/dL (ref 0.7–1.3)
Gamma Glob SerPl Elph-Mcnc: 1.8 g/dL (ref 0.4–1.8)
Globulin, Total: 3.8 g/dL (ref 2.2–3.9)
IgA: 50 mg/dL — ABNORMAL LOW (ref 61–437)
IgG (Immunoglobin G), Serum: 2049 mg/dL — ABNORMAL HIGH (ref 603–1613)
IgM (Immunoglobulin M), Srm: 112 mg/dL (ref 15–143)
M Protein SerPl Elph-Mcnc: 1.6 g/dL — ABNORMAL HIGH
Total Protein ELP: 7.3 g/dL (ref 6.0–8.5)

## 2023-12-31 ENCOUNTER — Inpatient Hospital Stay: Attending: Internal Medicine | Admitting: Internal Medicine

## 2023-12-31 ENCOUNTER — Encounter: Payer: Self-pay | Admitting: Internal Medicine

## 2023-12-31 ENCOUNTER — Inpatient Hospital Stay

## 2023-12-31 VITALS — BP 119/58 | HR 64 | Temp 98.1°F | Resp 16 | Ht 69.0 in | Wt 182.8 lb

## 2023-12-31 DIAGNOSIS — C9000 Multiple myeloma not having achieved remission: Secondary | ICD-10-CM | POA: Insufficient documentation

## 2023-12-31 DIAGNOSIS — D649 Anemia, unspecified: Secondary | ICD-10-CM | POA: Diagnosis not present

## 2023-12-31 DIAGNOSIS — E8809 Other disorders of plasma-protein metabolism, not elsewhere classified: Secondary | ICD-10-CM

## 2023-12-31 DIAGNOSIS — F17210 Nicotine dependence, cigarettes, uncomplicated: Secondary | ICD-10-CM | POA: Insufficient documentation

## 2023-12-31 DIAGNOSIS — Z79899 Other long term (current) drug therapy: Secondary | ICD-10-CM | POA: Insufficient documentation

## 2023-12-31 NOTE — Addendum Note (Signed)
 Addended by: LAEL BROWNING A on: 12/31/2023 11:13 AM   Modules accepted: Orders

## 2023-12-31 NOTE — Progress Notes (Signed)
 Fatigue/weakness: NO Dyspena: NO  Light headedness: NO  Blood in stool: NO

## 2023-12-31 NOTE — Assessment & Plan Note (Addendum)
#   MULTIPLE MYELOMA: ELEVATED M protein:MARCH 2023- IgGK- 1.6 /dl; K/L= 33 [anemia-see above; no hypercalcemia no renal insufficiency; ?  Bone lesions-no x-rays done] OCT  2024- M protein- 1.7; K/L ratio: 57. DEC 12th, Bone marrow Biopsy:  The bone marrow is hypercellular for age with increased number of atypical plasma cells representing 14%;  The background shows trilineage hematopoiesis with generally mild nonspecific changes.  Features  diagnostic of a myeloid neoplastic process are not present. Cytogenetic: FISH: Gain of 11/ q- standard risk. JAN 2025- PET scan- negative for any bone lesions.   # Again reviewed that patient's diagnosis is consistent with diagnosis of multiple myeloma-given the mild - moderate anemia [see below]-without any evidence of any other endorgan damage-hypercalcemia or acute renal failure or bone lesions on PET scan.  Patient is not a candidate for stem cell transplant given his age/comorbidities.    # AUG 2025-SLOW RISE in -M protein 1.6 g/dL kappa lambda light chain ratio 40- with mild anemia but no renal insufficiency or hypercalcemia-clinically stable/myeloma labs slowly progressing over time.  Given patient preference continue surveillance.  Again reviewed the treatment options include pills shots.   # Mild- moderate anemia-multifactorial  iron  deficiency/multiple myeloma-SEP 2025-hemoglobin 10.5-   ferritin-20];-status post Venofer  Continue oral iron  BID  HOLD OFF iron  infusion today. Consider at next visit if not improved.   # March 2022 [Dr.Sparks]-small volume PE/ RIGHT LE DVT- Positive for deep venous thrombosis in the right lower extremity [Above Knee- fem]; August 2022-Given recurrent DVT; prior history of PE--currently on indefinite anticoagulation.  No Active Bleeding.- stable  # Intermittent thrombocytopenia isolated  > 100- mild monitor for now-while on Eliquis . Stable.    # Hx o PVD [s/p stenting]- on eliquis -n [Dr.Dew]  # DISPOSITION: # HOLD venofer   today # follow up in 3 months-  2 weeks prior-- MD; labs- - cbc/cmp; B12; MM panel; K/L light chains; iron  studies; ferritin-/possible venofer ;  Dr.B

## 2023-12-31 NOTE — Progress Notes (Signed)
 [facility] CONSULT NOTE  Patient Care Team: Auston Reyes BIRCH, MD as PCP - General (Internal Medicine) Rennie Cindy SAUNDERS, MD as Consulting Physician (Oncology)  CHIEF COMPLAINTS/PURPOSE OF CONSULTATION: ANEMIA  HEMATOLOGY HISTORY:  # ANEMIA NORMOCYTIC  [JAN 2023- PCP-hemoglobin 8.7; saturation 7%; ferritin 23-LL-28]EGD/ colonoscopy-JAN 2023 [[facility]]; NO CT-A/P or capsule.   # MARCH 2023-possible MGUS; IgG kappa 1.6 g; kappa/lambda light chain ratio 33  March 2022-PE small volume right lower lobe; and right lower extremity above-knee DVT.  On Eliquis ; OFF in March 2023; JUNE 15th-DVT-femoral/popliteal nonocclusive-started on Eliquis - INDEFINITE   # Hx of PVD [Dr.Dew]   Latest Reference Range & Units Most Recent 01/29/22 07:55 08/11/22 07:55 02/10/23 08:18  IgG (Immunoglobin G), Serum 603 - 1,613 mg/dL 7,879 (H) 89/76/75 91:81 1,975 (H) 2,058 (H) 2,120 (H)  IgM (Immunoglobulin M), Srm 15 - 143 mg/dL 865 89/76/75 91:81 836 (H) 156 (H) 134  IgA 61 - 437 mg/dL 66 89/76/75 91:81 75 81 66 she Not Please so [name]  Kappa free light chain 3.3 - 19.4 mg/L 322.9 (H) 02/10/23 08:18 293.7 (H) 239.9 (H) 322.9 (H)  Lambda free light chains 5.7 - 26.3 mg/L 5.6 (L) 02/10/23 08:18 8.9 9.2 5.6 (L)  Kappa, lambda light chain ratio 0.26 - 1.65  57.66 (H) 02/10/23 08:18 33.00 (H) 26.08 (H) 57.66 (H)  (H): Data is abnormally high (L): Data is abnormally low    HISTORY OF PRESENTING ILLNESS: Patient accompanied by his wife.  Ambulating independently.  Slightly hard of hearing.  [name] 83 y.o.  male iron  deficient anemia-unclear etiology; history of DVT/PE-recurrent on eliquis ; IV iron  infusions for iron  deficiency, and a history of active myeloma on surveillance [patient preference] is here for a follow up.   Patient status post IV iron  infusion.  Notes to have slight improvement of energy levels.  He is on oral iron    BID.  Patient overall feels well over all. Appetite is good.    Review of Systems  Constitutional:  Positive for malaise/fatigue. Negative for chills, diaphoresis, fever and weight loss.  HENT:  Negative for nosebleeds and sore throat.   Eyes:  Negative for double vision.  Respiratory:  Negative for cough, hemoptysis, sputum production, shortness of breath and wheezing.   Cardiovascular:  Negative for chest pain, palpitations, orthopnea and leg swelling.  Gastrointestinal:  Negative for abdominal pain, blood in stool, constipation, diarrhea, heartburn, melena, nausea and vomiting.  Genitourinary:  Negative for dysuria, frequency and urgency.  Musculoskeletal:  Positive for joint pain. Negative for back pain.  Skin: Negative.  Negative for itching and rash.  Neurological:  Negative for dizziness, tingling, focal weakness, weakness and headaches.  Endo/Heme/Allergies:  Does not bruise/bleed easily.  Psychiatric/Behavioral:  Negative for depression. The patient is not nervous/anxious and does not have insomnia.     MEDICAL HISTORY:  Past Medical History:  Diagnosis Date   Actinic keratosis    Acute deep vein thrombosis (DVT) of distal vein of right lower extremity (HCC) 06/19/2020   Aortic atherosclerosis (HCC)    Bladder carcinoma (HCC) 05/26/2007   a.) papillary-transitional cell   BPH (benign prostatic hyperplasia)    Colon polyps    COPD (chronic obstructive pulmonary disease) (HCC)    Current use of long term anticoagulation    a.) Apixaban    Diverticulosis    DOE (dyspnea on exertion)    History of SCC (squamous cell carcinoma) of skin 01/06/2023   left deltoid  ED&C done 01/06/2023   HOH (hard of hearing)    a.) uses BILATERAL assistive devices   Hyperlipemia    Hypertension    Mass    Lt lower cheek   Melanoma (HCC) 2000   Tx by Dr Charlott   PAD (peripheral artery disease) [facility])    Peripheral vascular disease (HCC)    Pulmonary embolism (HCC) 06/19/2020   a.) RLL;  no associated RIGHT heart strain   RBBB (right bundle branch block)    Squamous cell carcinoma in situ (SCCIS) 06/08/2023   left infra axillary txt with ED&C   Squamous cell carcinoma of skin 02/01/2017   Left dorsum latera. hand. EDC   Squamous cell carcinoma of skin 02/01/2017   Left dorsum base of thumb. EDC   T2DM (type 2 diabetes mellitus) (HCC)     SURGICAL HISTORY: Past Surgical History:  Procedure Laterality Date   back skin cancer     BLADDER SURGERY     CARDIAC CATHETERIZATION     CATARACT EXTRACTION W/PHACO Right 04/30/2015   Procedure: CATARACT EXTRACTION PHACO AND INTRAOCULAR LENS PLACEMENT (IOC);  Surgeon: Elsie Carmine, MD;  Location: [facility] ORS;  Service: Ophthalmology;  Laterality: Right;  US  01:03 AP% 21.4 CDE  13.57 fluid pack lot # [id number]   CATARACT EXTRACTION W/PHACO Left 05/21/2015   Procedure: CATARACT EXTRACTION PHACO AND INTRAOCULAR LENS PLACEMENT (IOC);  Surgeon: Elsie Carmine, MD;  Location: [facility] ORS;  Service: Ophthalmology;  Laterality: Left;  US  00:52 AP% 18.9 CDE 9.95 fluid pack lot #[id number]   COLONOSCOPY WITH PROPOFOL  N/A 11/05/2014   Procedure: COLONOSCOPY WITH PROPOFOL ;  Surgeon: Deward CINDERELLA Piedmont, MD;  Location: [facility] ENDOSCOPY;  Service: Gastroenterology;  Laterality: N/A;   COLONOSCOPY WITH PROPOFOL  N/A 05/19/2021   Procedure: COLONOSCOPY WITH PROPOFOL ;  Surgeon: Onita Elspeth Sharper, DO;  Location[facility] ENDOSCOPY;  Service: Gastroenterology;  Laterality: N/A;  DM Patient is hard of hearing and would like for wife to answer questions, please.   ENDARTERECTOMY FEMORAL Right 02/26/2021   Procedure: ENDARTERECTOMY FEMORAL (SFA STENT PLACEMEN);  Surgeon: Marea Selinda RAMAN, MD;  Location: [facility] ORS;  Service: Vascular;  Laterality: Right;   ESOPHAGOGASTRODUODENOSCOPY (EGD) WITH PROPOFOL  N/A 05/19/2021   Procedure: ESOPHAGOGASTRODUODENOSCOPY (EGD) WITH PROPOFOL ;  Surgeon: Onita Elspeth Sharper, DO;  Location: [facility] ENDOSCOPY;  Service: Gastroenterology;  Laterality:  N/A;   HERNIA REPAIR     inguinal   IR BONE MARROW BIOPSY & ASPIRATION  03/23/2023   KYPHOPLASTY N/A 02/23/2019   Procedure: T12 KYPHOPLASTY;  Surgeon: Kathlynn Sharper, MD;  Location: [facility] ORS;  Service: Orthopedics;  Laterality: N/A;   LESION EXCISION N/A 02/23/2018   Procedure: EXCISION SCALP CYST AND FACIAL CYST;  Surgeon: Rodolph Romano, MD;  Location: [facility] ORS;  Service: General;  Laterality: N/A;   LOWER EXTREMITY ANGIOGRAPHY Right 01/23/2021   Procedure: LOWER EXTREMITY ANGIOGRAPHY;  Surgeon: Marea Selinda RAMAN, MD;  Location: [facility] INVASIVE CV LAB;  Service: Cardiovascular;  Laterality: Right;   LOWER EXTREMITY ANGIOGRAPHY Right 01/12/2022   Procedure: Lower Extremity Angiography;  Surgeon: Marea Selinda RAMAN, MD;  Location: [facility] INVASIVE CV LAB;  Service: Cardiovascular;  Laterality: Right;   LOWER EXTREMITY ANGIOGRAPHY Right 01/26/2022   Procedure: Lower Extremity Angiography;  Surgeon: Marea Selinda RAMAN, MD;  Location: [facility] INVASIVE CV LAB;  Service: Cardiovascular;  Laterality: Right;   SHOULDER ARTHROSCOPY W/ ROTATOR CUFF REPAIR Right    TUR-BT      SOCIAL HISTORY: Social History   Socioeconomic History   Marital status: Married    Spouse  name: Not on file   Number of children: Not on file   Years of education: Not on file   Highest education level: Not on file  Occupational History   Not on file  Tobacco Use   Smoking status: Every Day    Current packs/day: 0.50    Types: Cigarettes   Smokeless tobacco: Never  Vaping Use   Vaping status: Never Used  Substance and Sexual Activity   Alcohol  use: Yes    Alcohol /week: 6.0 standard drinks of alcohol     Types: 6 Cans of beer per week   Drug use: No   Sexual activity: Not on file  Other Topics Concern   Not on file  Social History Narrative   Lives in [place]; with wife. 2 children/near by. Smokes 1/2 ppd; 3-4 beers/weekend. Retired in 2010; Patent examiner.    Social Drivers of Corporate investment banker  Strain: Low Risk  (02/10/2023)   Received from [facility]   Overall Financial Resource Strain (CARDIA)    Difficulty of Paying Living Expenses: Not hard at all  Food Insecurity: No Food Insecurity (02/10/2023)   Received from [facility]   Hunger Vital Sign    Within the past 12 months, you worried that your food would run out before you got the money to buy more.: Never true    Within the past 12 months, the food you bought just didn't last and you didn't have money to get more.: Never true  Transportation Needs: No Transportation Needs (02/10/2023)   Received from [facility] - Transportation    In the past 12 months, has lack of transportation kept you from medical appointments or from getting medications?: No    Lack of Transportation (Non-Medical): No  Physical Activity: Not on file  Stress: Not on file  Social Connections: Not on file  Intimate Partner Violence: Not on file    FAMILY HISTORY: Family History  Problem Relation Age of Onset   Diabetes Mother    Colon cancer Mother    Diabetes Father     ALLERGIES:  has no known allergies.  MEDICATIONS:  Current Outpatient Medications  Medication Sig Dispense Refill   apixaban  (ELIQUIS ) 5 MG TABS tablet Take 1 tablet (5 mg total) by mouth 2 (two) times daily. 180 tablet 1   Artificial Tear Solution (SOOTHE XP) SOLN Place 1 drop into both eyes 3 (three) times daily as needed (dry eyes).     Fe Bisgly-Vit C-Vit B12-FA (GENTLE IRON  PO) Take 1 tablet by mouth in the morning and at bedtime.     finasteride  (PROSCAR ) 5 MG tablet Take 5 mg by mouth daily.     glimepiride (AMARYL) 4 MG tablet Take 4 mg by mouth in the morning and at bedtime.     hydrochlorothiazide  (HYDRODIURIL ) 25 MG tablet Take 25 mg by mouth daily.     Misc Natural Products (URINOZINC PLUS PO) Take 1 tablet by mouth 2 (two) times daily.      pioglitazone (ACTOS) 30 MG tablet Take 30 mg by mouth  daily.     telmisartan (MICARDIS) 80 MG tablet Take 80 mg by mouth daily.     No current facility-administered medications for this visit.      PHYSICAL EXAMINATION:   Vitals:   12/31/23 0958 12/31/23 1022  BP: (!) 126/97 (!) 119/58  Pulse: 64   Resp: 16   Temp: 98.1 F (36.7 C)  SpO2: 100%     Filed Weights   12/31/23 0958  Weight: 182 lb 12.8 oz (82.9 kg)     Physical Exam Vitals and nursing note reviewed.  HENT:     Head: Normocephalic and atraumatic.     Mouth/Throat:     Pharynx: Oropharynx is clear.  Eyes:     Extraocular Movements: Extraocular movements intact.     Pupils: Pupils are equal, round, and reactive to light.  Cardiovascular:     Rate and Rhythm: Normal rate and regular rhythm.  Pulmonary:     Comments: Decreased breath sounds bilaterally.  Abdominal:     Palpations: Abdomen is soft.  Musculoskeletal:        General: Normal range of motion.     Cervical back: Normal range of motion.  Skin:    General: Skin is warm.  Neurological:     General: No focal deficit present.     Mental Status: He is alert and oriented to person, place, and time.  Psychiatric:        Behavior: Behavior normal.        Judgment: Judgment normal.     LABORATORY DATA:  I have reviewed the data as listed Lab Results  Component Value Date   WBC 3.8 (L) 12/17/2023   HGB 10.6 (L) 12/17/2023   HCT 33.1 (L) 12/17/2023   MCV 104.7 (H) 12/17/2023   PLT 181 12/17/2023   Recent Labs    07/23/23 0914 09/17/23 0813 12/17/23 0801  NA 134* 134* 137  K 3.8 3.8 3.6  CL 102 103 104  CO2 24 23 26   GLUCOSE 157* 146* 85  BUN 23 26* 26*  CREATININE 0.86 1.15 1.06  CALCIUM 9.2 9.1 9.3  GFRNONAA >60 >60 >60  PROT 7.4 8.1 7.8  ALBUMIN 3.7 4.0 3.9  AST 20 26 27   ALT 22 25 21   ALKPHOS 70 69 66  BILITOT 0.6 0.8 0.6     VAS US  ABI WITH/WO TBI Result Date: 12/06/2023  LOWER EXTREMITY DOPPLER STUDY Patient Name:  JOSHUAJAMES MOEHRING  Date of Exam:   12/03/2023 Medical  Rec #: [id number]         Accession #:    [id number] Date of Birth: 02-08-1941          Patient Gender: M Patient Age:   55 years Exam Location:  [facility] Procedure:      VAS US  ABI WITH/WO TBI Referring Phys: --------------------------------------------------------------------------------  Indications: Claudication, rest pain, and peripheral artery disease. High Risk Factors: Hypertension.  Vascular Interventions: 02/26/21: Right CFA/PFA/SFA endarterectomy with right                         SFA/popliteal stent;. Comparison Study: 05/2023 Performing Technologist: Jerel Croak RVT  Examination Guidelines: A complete evaluation includes at minimum, Doppler waveform signals and systolic blood pressure reading at the level of bilateral brachial, anterior tibial, and posterior tibial arteries, when vessel segments are accessible. Bilateral testing is considered an integral part of a complete examination. Photoelectric Plethysmograph (PPG) waveforms and toe systolic pressure readings are included as required and additional duplex testing as needed. Limited examinations for reoccurring indications may be performed as noted.  ABI Findings: +---------+------------------+-----+----------+--------+ Right    Rt Pressure (mmHg)IndexWaveform  Comment  +---------+------------------+-----+----------+--------+ Brachial 110                                       +---------+------------------+-----+----------+--------+  PTA      99                0.90 monophasic         +---------+------------------+-----+----------+--------+ DP       78                0.71 monophasic         +---------+------------------+-----+----------+--------+ Great Toe67                0.61 Abnormal           +---------+------------------+-----+----------+--------+ +---------+------------------+-----+--------+-------+ Left     Lt Pressure (mmHg)IndexWaveformComment +---------+------------------+-----+--------+-------+  Brachial 107                                    +---------+------------------+-----+--------+-------+ PTA      118               1.07 biphasic        +---------+------------------+-----+--------+-------+ DP       124               1.13 biphasic        +---------+------------------+-----+--------+-------+ [place] Oka               1.06 Normal          +---------+------------------+-----+--------+-------+ +-------+-----------+-----------+------------+------------+ ABI/TBIToday's ABIToday's TBIPrevious ABIPrevious TBI +-------+-----------+-----------+------------+------------+ Right  .90        .61        .84         .50          +-------+-----------+-----------+------------+------------+ Left   1.13       1.06       1.05        .90          +-------+-----------+-----------+------------+------------+  Right ABIs and TBIs appear increased compared to prior study on 05/2022.  Summary: Right: Resting right ankle-brachial index indicates mild right lower extremity arterial disease. The right toe-brachial index is abnormal. Left: Resting left ankle-brachial index is within normal range. The left toe-brachial index is normal. *See table(s) above for measurements and observations.  Electronically signed by Selinda Gu MD on 12/06/2023 at 7:45:18 AM.    Final      Plasma cell dyscrasia # MULTIPLE MYELOMA: ELEVATED M protein:MARCH 2023- IgGK- 1.6 /dl; K/L= 33 [anemia-see above; no hypercalcemia no renal insufficiency; ?  Bone lesions-no x-rays done] OCT  2024- M protein- 1.7; K/L ratio: 57. DEC 12th, Bone marrow Biopsy:  The bone marrow is hypercellular for age with increased number of atypical plasma cells representing 14%;  The background shows trilineage hematopoiesis with generally mild nonspecific changes.  Features  diagnostic of a myeloid neoplastic process are not present. Cytogenetic: FISH: Gain of 11/ q- standard risk. JAN 2025- PET scan- negative for any bone lesions.   #  Again reviewed that patient's diagnosis is consistent with diagnosis of multiple myeloma-given the mild - moderate anemia [see below]-without any evidence of any other endorgan damage-hypercalcemia or acute renal failure or bone lesions on PET scan.  Patient is not a candidate for stem cell transplant given his age/comorbidities.    # AUG 2025-SLOW RISE in -M protein 1.6 g/dL kappa lambda light chain ratio 40- with mild anemia but no renal insufficiency or hypercalcemia-clinically stable/myeloma labs slowly progressing over time.  Given patient preference continue surveillance.  Again reviewed the treatment options include pills shots.   # Mild- moderate anemia-multifactorial  iron  deficiency/multiple myeloma-SEP 2025-hemoglobin 10.5-   ferritin-20];-status post Venofer  Continue oral iron  BID  HOLD OFF iron  infusion today. Consider at next visit if not improved.   # March 2022 [Dr.Sparks]-small volume PE/ RIGHT LE DVT- Positive for deep venous thrombosis in the right lower extremity [Above Knee- fem]; August 2022-Given recurrent DVT; prior history of PE--currently on indefinite anticoagulation.  No Active Bleeding.- stable  # Intermittent thrombocytopenia isolated  > 100- mild monitor for now-while on Eliquis . Stable.    # Hx o PVD [s/p stenting]- on eliquis -n [Dr.Dew]  # DISPOSITION: # HOLD venofer  today # follow up in 3 months-  2 weeks prior-- MD; labs- - cbc/cmp; B12; MM panel; K/L light chains; iron  studies; ferritin-/possible venofer ;  Dr.B      All questions were answered. The patient knows to call the clinic with any problems, questions or concerns.    Cindy JONELLE Joe, MD 12/31/2023 10:56 AM

## 2024-01-07 DIAGNOSIS — M25512 Pain in left shoulder: Secondary | ICD-10-CM | POA: Diagnosis not present

## 2024-01-18 DIAGNOSIS — Z23 Encounter for immunization: Secondary | ICD-10-CM | POA: Diagnosis not present

## 2024-02-01 DIAGNOSIS — M75102 Unspecified rotator cuff tear or rupture of left shoulder, not specified as traumatic: Secondary | ICD-10-CM | POA: Diagnosis not present

## 2024-02-01 DIAGNOSIS — M19012 Primary osteoarthritis, left shoulder: Secondary | ICD-10-CM | POA: Diagnosis not present

## 2024-02-09 ENCOUNTER — Ambulatory Visit: Payer: PPO | Admitting: Dermatology

## 2024-02-10 ENCOUNTER — Encounter: Payer: Self-pay | Admitting: Dermatology

## 2024-02-10 ENCOUNTER — Ambulatory Visit: Payer: PPO | Admitting: Dermatology

## 2024-02-10 DIAGNOSIS — L820 Inflamed seborrheic keratosis: Secondary | ICD-10-CM

## 2024-02-10 DIAGNOSIS — Z8589 Personal history of malignant neoplasm of other organs and systems: Secondary | ICD-10-CM

## 2024-02-10 DIAGNOSIS — Z1283 Encounter for screening for malignant neoplasm of skin: Secondary | ICD-10-CM | POA: Diagnosis not present

## 2024-02-10 DIAGNOSIS — Z5111 Encounter for antineoplastic chemotherapy: Secondary | ICD-10-CM

## 2024-02-10 DIAGNOSIS — Z79899 Other long term (current) drug therapy: Secondary | ICD-10-CM | POA: Diagnosis not present

## 2024-02-10 DIAGNOSIS — L570 Actinic keratosis: Secondary | ICD-10-CM | POA: Diagnosis not present

## 2024-02-10 DIAGNOSIS — Z7189 Other specified counseling: Secondary | ICD-10-CM | POA: Diagnosis not present

## 2024-02-10 DIAGNOSIS — L578 Other skin changes due to chronic exposure to nonionizing radiation: Secondary | ICD-10-CM

## 2024-02-10 DIAGNOSIS — L720 Epidermal cyst: Secondary | ICD-10-CM

## 2024-02-10 DIAGNOSIS — Z85820 Personal history of malignant melanoma of skin: Secondary | ICD-10-CM

## 2024-02-10 DIAGNOSIS — L729 Follicular cyst of the skin and subcutaneous tissue, unspecified: Secondary | ICD-10-CM

## 2024-02-10 NOTE — Progress Notes (Signed)
 Follow-Up Visit   Subjective  Jonathon Barrera is a 83 y.o. male who presents for the following: Skin Cancer Screening and Full Body Skin Exam Hx of melanoma, hx of sccis, hx of scc, hx of aks, hx of isks  The patient presents for Total-Body Skin Exam (TBSE) for skin cancer screening and mole check. The patient has spots, moles and lesions to be evaluated, some may be new or changing and the patient may have concern these could be cancer.  The following portions of the chart were reviewed this encounter and updated as appropriate: medications, allergies, medical history  Review of Systems:  No other skin or systemic complaints except as noted in HPI or Assessment and Plan.  Objective  Well appearing patient in no apparent distress; mood and affect are within normal limits.  A full examination was performed including scalp, head, eyes, ears, nose, lips, neck, chest, axillae, abdomen, back, buttocks, bilateral upper extremities, bilateral lower extremities, hands, feet, fingers, toes, fingernails, and toenails. All findings within normal limits unless otherwise noted below.   Relevant physical exam findings are noted in the Assessment and Plan.  b/l arms and hands x 37 (37) Erythematous thin papules/macules with gritty scale.  right temple x 1 Erythematous stuck-on, waxy papule or plaque  Assessment & Plan   HISTORY OF MELANOMA - Back txted by Dr. Charlott 2000 - No evidence of recurrence today - No lymphadenopathy - Recommend regular full body skin exams - Recommend daily broad spectrum sunscreen SPF 30+ to sun-exposed areas, reapply every 2 hours as needed.  - Call if any new or changing lesions are noted between office visits   HISTORY OF SQUAMOUS CELL CARCINOMA IN SITU OF THE SKIN - 06/08/2023 Left infra axillary treated with ED&C  - No evidence of recurrence today - Recommend regular full body skin exams - Recommend daily broad spectrum sunscreen SPF 30+ to sun-exposed  areas, reapply every 2 hours as needed.  - Call if any new or changing lesions are noted between office visits  HISTORY OF SQUAMOUS CELL CARCINOMA OF THE SKIN 01/06/2023 Left deltoid - txt with [facility]  02/01/2017 left dorsum lateral hand and left dorsum base of thumb treated ED&C  - No evidence of recurrence today - No lymphadenopathy - Recommend regular full body skin exams - Recommend daily broad spectrum sunscreen SPF 30+ to sun-exposed areas, reapply every 2 hours as needed.  - Call if any new or changing lesions are noted between office visits  SKIN CANCER SCREENING PERFORMED TODAY.  LENTIGINES, SEBORRHEIC KERATOSES, HEMANGIOMAS - Benign normal skin lesions - Benign-appearing - Call for any changes  MELANOCYTIC NEVI - Tan-brown and/or pink-flesh-colored symmetric macules and papules - Benign appearing on exam today - Observation - Call clinic for new or changing moles - Recommend daily use of broad spectrum spf 30+ sunscreen to sun-exposed areas.   [place] Exam: red or purple papule at b/l ears  Treatment Plan: Benign-appearing. Observe   EPIDERMAL INCLUSION CYST Exam: Subcutaneous nodule at L post neck Benign-appearing. Exam most consistent with an epidermal inclusion cyst. Discussed that a cyst is a benign growth that can grow over time and sometimes get irritated or inflamed. Recommend observation if it is not bothersome. Discussed option of surgical excision to remove it if it is growing, symptomatic, or other changes noted. Please call for new or changing lesions so they can be evaluated.  ACTINIC KERATOSIS (37) b/l arms and hands x 37 (37) Start January 1 or patient instructed  to use when he can, having shoulder replacement on left shoulder in November  - Start 5-fluorouracil/calcipotriene cream twice a day for 10 days to affected areas including arms and hands. Prescription sent to [facility]. Patient advised they will receive an email to  purchase the medication online and have it sent to their home. Patient provided with handout reviewing treatment course and side effects and advised to call or message us  on MyChart with any concerns.  Reviewed course of treatment and expected reaction.  Patient advised to expect inflammation and crusting and advised that erosions are possible.  Patient advised to be diligent with sun protection during and after treatment. Counseled to keep medication out of reach of children and pets.  ACTINIC DAMAGE WITH PRECANCEROUS ACTINIC KERATOSES Counseling for Topical Chemotherapy Management: Patient exhibits: - Severe, confluent actinic changes with pre-cancerous actinic keratoses that is secondary to cumulative UV radiation exposure over time - Condition that is severe; chronic, not at goal. - diffuse scaly erythematous macules and papules with underlying dyspigmentation - Discussed Prescription Field Treatment topical Chemotherapy for Severe, Chronic Confluent Actinic Changes with Pre-Cancerous Actinic Keratoses Field treatment involves treatment of an entire area of skin that has confluent Actinic Changes (Sun/ Ultraviolet light damage) and PreCancerous Actinic Keratoses by method of PhotoDynamic Therapy (PDT) and/or prescription Topical Chemotherapy agents such as 5-fluorouracil, 5-fluorouracil/calcipotriene, and/or imiquimod.  The purpose is to decrease the number of clinically evident and subclinical PreCancerous lesions to prevent progression to development of skin cancer by chemically destroying early precancer changes that may or may not be visible.  It has been shown to reduce the risk of developing skin cancer in the treated area. As a result of treatment, redness, scaling, crusting, and open sores may occur during treatment course. One or more than one of these methods may be used and may have to be used several times to control, suppress and eliminate the PreCancerous changes. Discussed treatment  course, expected reaction, and possible side effects. - Recommend daily broad spectrum sunscreen SPF 30+ to sun-exposed areas, reapply every 2 hours as needed.  - Staying in the shade or wearing long sleeves, sun glasses (UVA+UVB protection) and wide brim hats (4-inch brim around the entire circumference of the hat) are also recommended. - Call for new or changing lesions.  Actinic keratoses are precancerous spots that appear secondary to cumulative UV radiation exposure/sun exposure over time. They are chronic with expected duration over 1 year. A portion of actinic keratoses will progress to squamous cell carcinoma of the skin. It is not possible to reliably predict which spots will progress to skin cancer and so treatment is recommended to prevent development of skin cancer.  Recommend daily broad spectrum sunscreen SPF 30+ to sun-exposed areas, reapply every 2 hours as needed.  Recommend staying in the shade or wearing long sleeves, sun glasses (UVA+UVB protection) and wide brim hats (4-inch brim around the entire circumference of the hat). Call for new or changing lesions. Destruction of lesion - b/l arms and hands x 37 (37) Complexity: simple   Destruction method: cryotherapy   Informed consent: discussed and consent obtained   Timeout:  patient name, date of birth, surgical site, and procedure verified Lesion destroyed using liquid nitrogen: Yes   Region frozen until ice ball extended beyond lesion: Yes   Outcome: patient tolerated procedure well with no complications   Post-procedure details: wound care instructions given    INFLAMED SEBORRHEIC KERATOSIS right temple x 1 Symptomatic, irritating, patient would like  treated. Destruction of lesion - right temple x 1 Complexity: simple   Destruction method: cryotherapy   Informed consent: discussed and consent obtained   Timeout:  patient name, date of birth, surgical site, and procedure verified Lesion destroyed using liquid  nitrogen: Yes   Region frozen until ice ball extended beyond lesion: Yes   Outcome: patient tolerated procedure well with no complications   Post-procedure details: wound care instructions given    Return in about 5 months (around 07/10/2024) for ak follow up, 1 year tbse .  IEleanor Blush, CMA, am acting as scribe for Alm Rhyme, MD.   Documentation: I have reviewed the above documentation for accuracy and completeness, and I agree with the above.  Alm Rhyme, MD

## 2024-02-10 NOTE — Patient Instructions (Addendum)
 Start January 1 or dont order medication until ready to use   - Start 5-fluorouracil/calcipotriene cream twice a day for 10 days to affected areas including hand and arms . Prescription sent to [facility]. Patient advised they will receive an email to purchase the medication online and have it sent to their home. Patient provided with handout reviewing treatment course and side effects and advised to call or message us  on MyChart with any concerns.  Reviewed course of treatment and expected reaction.  Patient advised to expect inflammation and crusting and advised that erosions are possible.  Patient advised to be diligent with sun protection during and after treatment. Counseled to keep medication out of reach of children and pets.     Instructions for [facility]  One or more of your medications was sent to the [facility]. You will receive an email from them and can purchase the medicine through that link. It will then be mailed to your home at the address you confirmed. If for any reason you do not receive an email from them, please check your spam folder. If you still do not find the email, please let us  know. [facility] phone number is [phone].    5-Fluorouracil/Calcipotriene Patient Education   Actinic keratoses are the dry, red scaly spots on the skin caused by sun damage. A portion of these spots can turn into skin cancer with time, and treating them can help prevent development of skin cancer.   Treatment of these spots requires removal of the defective skin cells. There are various ways to remove actinic keratoses, including freezing with liquid nitrogen, treatment with creams, or treatment with a blue light procedure in the office.   5-fluorouracil cream is a topical cream used to treat actinic keratoses. It works by interfering with the growth of abnormal fast-growing skin cells, such as  actinic keratoses. These cells peel off and are replaced by healthy ones.   5-fluorouracil/calcipotriene is a combination of the 5-fluorouracil cream with a vitamin D analog cream called calcipotriene. The calcipotriene alone does not treat actinic keratoses. However, when it is combined with 5-fluorouracil, it helps the 5-fluorouracil treat the actinic keratoses much faster so that the same results can be achieved with a much shorter treatment time.  INSTRUCTIONS FOR 5-FLUOROURACIL/CALCIPOTRIENE CREAM:   5-fluorouracil/calcipotriene cream typically only needs to be used for 4-7 days. A thin layer should be applied twice a day to the treatment areas recommended by your physician.   If your physician prescribed you separate tubes of 5-fluourouracil and calcipotriene, apply a thin layer of 5-fluorouracil followed by a thin layer of calcipotriene.   Avoid contact with your eyes, nostrils, and mouth. Do not use 5-fluorouracil/calcipotriene cream on infected or open wounds.   You will develop redness, irritation and some crusting at areas where you have pre-cancer damage/actinic keratoses. IF YOU DEVELOP PAIN, BLEEDING, OR SIGNIFICANT CRUSTING, STOP THE TREATMENT EARLY - you have already gotten a good response and the actinic keratoses should clear up well.  Wash your hands after applying 5-fluorouracil 5% cream on your skin.   A moisturizer or sunscreen with a minimum SPF 30 should be applied each morning.   Once you have finished the treatment, you can apply a thin layer of Vaseline twice a day to irritated areas to soothe and calm the areas more quickly. If you experience significant discomfort, contact your physician.  For some patients it is necessary to repeat the treatment  for best results.  SIDE EFFECTS: When using 5-fluorouracil/calcipotriene cream, you may have mild irritation, such as redness, dryness, swelling, or a mild burning sensation. This usually resolves within 2 weeks. The more  actinic keratoses you have, the more redness and inflammation you can expect during treatment. Eye irritation has been reported rarely. If this occurs, please let us  know.  If you have any trouble using this cream, please call the office. If you have any other questions about this information, please do not hesitate to ask me before you leave the office.     Cryotherapy Aftercare  Wash gently with soap and water everyday.   Apply Vaseline and Band-Aid daily until healed.   Actinic keratoses are precancerous spots that appear secondary to cumulative UV radiation exposure/sun exposure over time. They are chronic with expected duration over 1 year. A portion of actinic keratoses will progress to squamous cell carcinoma of the skin. It is not possible to reliably predict which spots will progress to skin cancer and so treatment is recommended to prevent development of skin cancer.  Recommend daily broad spectrum sunscreen SPF 30+ to sun-exposed areas, reapply every 2 hours as needed.  Recommend staying in the shade or wearing long sleeves, sun glasses (UVA+UVB protection) and wide brim hats (4-inch brim around the entire circumference of the hat). Call for new or changing lesions.   Seborrheic Keratosis  What causes seborrheic keratoses? Seborrheic keratoses are harmless, common skin growths that first appear during adult life.  As time goes by, more growths appear.  Some people may develop a large number of them.  Seborrheic keratoses appear on both covered and uncovered body parts.  They are not caused by sunlight.  The tendency to develop seborrheic keratoses can be inherited.  They vary in color from skin-colored to gray, brown, or even black.  They can be either smooth or have a rough, warty surface.   Seborrheic keratoses are superficial and look as if they were stuck on the skin.  Under the microscope this type of keratosis looks like layers upon layers of skin.  That is why at times the top  layer may seem to fall off, but the rest of the growth remains and re-grows.    Treatment Seborrheic keratoses do not need to be treated, but can easily be removed in the office.  Seborrheic keratoses often cause symptoms when they rub on clothing or jewelry.  Lesions can be in the way of shaving.  If they become inflamed, they can cause itching, soreness, or burning.  Removal of a seborrheic keratosis can be accomplished by freezing, burning, or surgery. If any spot bleeds, scabs, or grows rapidly, please return to have it checked, as these can be an indication of a skin cancer.        Melanoma ABCDEs  Melanoma is the most dangerous type of skin cancer, and is the leading cause of death from skin disease.  You are more likely to develop melanoma if you: Have light-colored skin, light-colored eyes, or red or blond hair Spend a lot of time in the sun Tan regularly, either outdoors or in a tanning bed Have had blistering sunburns, especially during childhood Have a close family member who has had a melanoma Have atypical moles or large birthmarks  Early detection of melanoma is key since treatment is typically straightforward and cure rates are extremely high if we catch it early.   The first sign of melanoma is often a change in a mole  or a new dark spot.  The ABCDE system is a way of remembering the signs of melanoma.  A for asymmetry:  The two halves do not match. B for border:  The edges of the growth are irregular. C for color:  A mixture of colors are present instead of an even brown color. D for diameter:  Melanomas are usually (but not always) greater than 6mm - the size of a pencil eraser. E for evolution:  The spot keeps changing in size, shape, and color.  Please check your skin once per month between visits. You can use a small mirror in front and a large mirror behind you to keep an eye on the back side or your body.   If you see any new or changing lesions before your  next follow-up, please call to schedule a visit.  Please continue daily skin protection including broad spectrum sunscreen SPF 30+ to sun-exposed areas, reapplying every 2 hours as needed when you're outdoors.   Staying in the shade or wearing long sleeves, sun glasses (UVA+UVB protection) and wide brim hats (4-inch brim around the entire circumference of the hat) are also recommended for sun protection.    Due to recent changes in healthcare laws, you may see results of your pathology and/or laboratory studies on MyChart before the doctors have had a chance to review them. We understand that in some cases there may be results that are confusing or concerning to you. Please understand that not all results are received at the same time and often the doctors may need to interpret multiple results in order to provide you with the best plan of care or course of treatment. Therefore, we ask that you please give us  2 business days to thoroughly review all your results before contacting the office for clarification. Should we see a critical lab result, you will be contacted sooner.   If You Need Anything After Your Visit  If you have any questions or concerns for your doctor, please call our main line at [phone] and press option 4 to reach your doctor's medical assistant. If no one answers, please leave a voicemail as directed and we will return your call as soon as possible. Messages left after 4 pm will be answered the following business day.   You may also send us  a message via MyChart. We typically respond to MyChart messages within 1-2 business days.  For prescription refills, please ask your pharmacy to contact our office. Our fax number is [phone].  If you have an urgent issue when the clinic is closed that cannot wait until the next business day, you can page your doctor at the number below.    Please note that while we do our best to be available for urgent issues outside of office  hours, we are not available 24/7.   If you have an urgent issue and are unable to reach us , you may choose to seek medical care at your doctor's office, retail clinic, urgent care center, or emergency room.  If you have a medical emergency, please immediately call 911 or go to the emergency department.  Pager Numbers  - Dr. Hester: [phone]  - Dr. Jackquline: [phone]  - Dr. Claudene: [phone]   - Dr. Raymund: [phone]  In the event of inclement weather, please call our main line at [phone] for an update on the status of any delays or closures.  Dermatology Medication Tips: Please keep the boxes that topical medications come in in order  to help keep track of the instructions about where and how to use these. Pharmacies typically print the medication instructions only on the boxes and not directly on the medication tubes.   If your medication is too expensive, please contact our office at [phone] option 4 or send us  a message through MyChart.   We are unable to tell what your co-pay for medications will be in advance as this is different depending on your insurance coverage. However, we may be able to find a substitute medication at lower cost or fill out paperwork to get insurance to cover a needed medication.   If a prior authorization is required to get your medication covered by your insurance company, please allow us  1-2 business days to complete this process.  Drug prices often vary depending on where the prescription is filled and some pharmacies may offer cheaper prices.  The website www.goodrx.com contains coupons for medications through different pharmacies. The prices here do not account for what the cost may be with help from insurance (it may be cheaper with your insurance), but the website can give you the price if you did not use any insurance.  - You can print the associated coupon and take it with your prescription to the pharmacy.  - You may also  stop by our office during regular business hours and pick up a GoodRx coupon card.  - If you need your prescription sent electronically to a different pharmacy, notify our office through [facility] or by phone at [phone] option 4.     Si Usted Necesita Algo Despus de Su Visita  Tambin puede enviarnos un mensaje a travs de Clinical cytogeneticist. Por lo general respondemos a los mensajes de MyChart en el transcurso de 1 a 2 das hbiles.  Para renovar recetas, por favor pida a su farmacia que se ponga en contacto con nuestra oficina. Randi lakes de fax es [place] [phone].  Si tiene un asunto urgente cuando la clnica est cerrada y que no puede esperar hasta el siguiente da hbil, puede llamar/localizar a su doctor(a) al nmero que aparece a continuacin.   Por favor, tenga en cuenta que aunque hacemos todo lo posible para estar disponibles para asuntos urgentes fuera del horario de [place], no estamos disponibles las 24 horas del da, los 7 [address]  [address] de la [place].   Si tiene un problema urgente y no puede comunicarse con nosotros, puede optar por buscar atencin mdica  en el consultorio de su doctor(a), en una clnica privada, en un centro de atencin urgente o en una sala de emergencias.  Si tiene Engineer, drilling, por favor llame inmediatamente al 911 o vaya a la sala de emergencias.  Nmeros de bper  - Dr. Hester: [phone]  - Dra. Jackquline: [phone]  - Dr. Claudene: [phone]  - Dra. Kitts: [phone]  En caso de inclemencias del [place], por favor llame a nuestra lnea principal al [phone] para una actualizacin sobre el estado de cualquier retraso o cierre.  Consejos para la medicacin en dermatologa: Por favor, guarde las cajas en las que vienen los medicamentos de uso tpico para ayudarle a seguir las instrucciones sobre dnde y cmo usarlos. Las farmacias generalmente imprimen las instrucciones del medicamento slo en las cajas y no directamente en los  tubos del [place].   Si su medicamento es muy caro, por favor, pngase en contacto con landry rieger llamando al [phone] y presione la opcin 4 o envenos un mensaje a travs de Clinical cytogeneticist.   No podemos  decirle cul ser su copago por los medicamentos por adelantado ya que esto es diferente dependiendo de la cobertura de su seguro. Sin embargo, es posible que podamos encontrar un medicamento sustituto a Audiological scientist un formulario para que el seguro cubra el medicamento que se considera necesario.   Si se requiere una autorizacin previa para que su compaa de seguros malta su medicamento, por favor permtanos de 1 a 2 das hbiles para completar este proceso.  Los precios de los medicamentos varan con frecuencia dependiendo del Environmental consultant de dnde se surte la receta y alguna farmacias pueden ofrecer precios ms baratos.  El sitio web www.goodrx.com tiene cupones para medicamentos de Health and safety inspector. Los precios aqu no tienen en cuenta lo que podra costar con la ayuda del seguro (puede ser ms barato con su seguro), pero el sitio web puede darle el precio si no utiliz Tourist information centre manager.  - Puede imprimir el cupn correspondiente y llevarlo con su receta a la farmacia.  - Tambin puede pasar por nuestra oficina durante el horario de atencin regular y Education officer, museum una tarjeta de cupones de GoodRx.  - Si necesita que su receta se enve electrnicamente a una farmacia diferente, informe a nuestra oficina a travs de MyChart de [place] o por telfono llamando al [phone] y presione la opcin 4.

## 2024-02-11 DIAGNOSIS — E119 Type 2 diabetes mellitus without complications: Secondary | ICD-10-CM | POA: Diagnosis not present

## 2024-02-11 DIAGNOSIS — G245 Blepharospasm: Secondary | ICD-10-CM | POA: Diagnosis not present

## 2024-02-11 DIAGNOSIS — H43813 Vitreous degeneration, bilateral: Secondary | ICD-10-CM | POA: Diagnosis not present

## 2024-02-11 DIAGNOSIS — H3554 Dystrophies primarily involving the retinal pigment epithelium: Secondary | ICD-10-CM | POA: Diagnosis not present

## 2024-02-18 DIAGNOSIS — G245 Blepharospasm: Secondary | ICD-10-CM | POA: Diagnosis not present

## 2024-02-22 DIAGNOSIS — M75102 Unspecified rotator cuff tear or rupture of left shoulder, not specified as traumatic: Secondary | ICD-10-CM | POA: Diagnosis not present

## 2024-02-22 DIAGNOSIS — Z01818 Encounter for other preprocedural examination: Secondary | ICD-10-CM | POA: Diagnosis not present

## 2024-03-01 DIAGNOSIS — Z86718 Personal history of other venous thrombosis and embolism: Secondary | ICD-10-CM | POA: Diagnosis not present

## 2024-03-01 DIAGNOSIS — M75102 Unspecified rotator cuff tear or rupture of left shoulder, not specified as traumatic: Secondary | ICD-10-CM | POA: Diagnosis not present

## 2024-03-01 DIAGNOSIS — G8918 Other acute postprocedural pain: Secondary | ICD-10-CM | POA: Diagnosis not present

## 2024-03-01 DIAGNOSIS — Z85828 Personal history of other malignant neoplasm of skin: Secondary | ICD-10-CM | POA: Diagnosis not present

## 2024-03-01 DIAGNOSIS — Z7984 Long term (current) use of oral hypoglycemic drugs: Secondary | ICD-10-CM | POA: Diagnosis not present

## 2024-03-01 DIAGNOSIS — Z85820 Personal history of malignant melanoma of skin: Secondary | ICD-10-CM | POA: Diagnosis not present

## 2024-03-01 DIAGNOSIS — Z8551 Personal history of malignant neoplasm of bladder: Secondary | ICD-10-CM | POA: Diagnosis not present

## 2024-03-01 DIAGNOSIS — I10 Essential (primary) hypertension: Secondary | ICD-10-CM | POA: Diagnosis not present

## 2024-03-01 DIAGNOSIS — J449 Chronic obstructive pulmonary disease, unspecified: Secondary | ICD-10-CM | POA: Diagnosis not present

## 2024-03-01 DIAGNOSIS — Z86711 Personal history of pulmonary embolism: Secondary | ICD-10-CM | POA: Diagnosis not present

## 2024-03-01 DIAGNOSIS — F17210 Nicotine dependence, cigarettes, uncomplicated: Secondary | ICD-10-CM | POA: Diagnosis not present

## 2024-03-01 DIAGNOSIS — N400 Enlarged prostate without lower urinary tract symptoms: Secondary | ICD-10-CM | POA: Diagnosis not present

## 2024-03-01 DIAGNOSIS — Z7901 Long term (current) use of anticoagulants: Secondary | ICD-10-CM | POA: Diagnosis not present

## 2024-03-01 DIAGNOSIS — E119 Type 2 diabetes mellitus without complications: Secondary | ICD-10-CM | POA: Diagnosis not present

## 2024-03-01 DIAGNOSIS — M75122 Complete rotator cuff tear or rupture of left shoulder, not specified as traumatic: Secondary | ICD-10-CM | POA: Diagnosis not present

## 2024-03-03 DIAGNOSIS — Z1331 Encounter for screening for depression: Secondary | ICD-10-CM | POA: Diagnosis not present

## 2024-03-03 DIAGNOSIS — E118 Type 2 diabetes mellitus with unspecified complications: Secondary | ICD-10-CM | POA: Diagnosis not present

## 2024-03-03 DIAGNOSIS — I10 Essential (primary) hypertension: Secondary | ICD-10-CM | POA: Diagnosis not present

## 2024-03-03 DIAGNOSIS — Z79899 Other long term (current) drug therapy: Secondary | ICD-10-CM | POA: Diagnosis not present

## 2024-03-03 DIAGNOSIS — J431 Panlobular emphysema: Secondary | ICD-10-CM | POA: Diagnosis not present

## 2024-03-03 DIAGNOSIS — Z720 Tobacco use: Secondary | ICD-10-CM | POA: Diagnosis not present

## 2024-03-03 DIAGNOSIS — N401 Enlarged prostate with lower urinary tract symptoms: Secondary | ICD-10-CM | POA: Diagnosis not present

## 2024-03-03 DIAGNOSIS — E782 Mixed hyperlipidemia: Secondary | ICD-10-CM | POA: Diagnosis not present

## 2024-03-03 DIAGNOSIS — Z0000 Encounter for general adult medical examination without abnormal findings: Secondary | ICD-10-CM | POA: Diagnosis not present

## 2024-03-14 DIAGNOSIS — M25612 Stiffness of left shoulder, not elsewhere classified: Secondary | ICD-10-CM | POA: Diagnosis not present

## 2024-03-14 DIAGNOSIS — M25512 Pain in left shoulder: Secondary | ICD-10-CM | POA: Diagnosis not present

## 2024-03-15 DIAGNOSIS — M19012 Primary osteoarthritis, left shoulder: Secondary | ICD-10-CM | POA: Diagnosis not present

## 2024-03-15 DIAGNOSIS — Z4889 Encounter for other specified surgical aftercare: Secondary | ICD-10-CM | POA: Diagnosis not present

## 2024-03-20 ENCOUNTER — Inpatient Hospital Stay: Attending: Internal Medicine

## 2024-03-20 DIAGNOSIS — D649 Anemia, unspecified: Secondary | ICD-10-CM | POA: Insufficient documentation

## 2024-03-20 DIAGNOSIS — C9002 Multiple myeloma in relapse: Secondary | ICD-10-CM | POA: Insufficient documentation

## 2024-03-20 DIAGNOSIS — E8809 Other disorders of plasma-protein metabolism, not elsewhere classified: Secondary | ICD-10-CM

## 2024-03-20 LAB — CBC WITH DIFFERENTIAL (CANCER CENTER ONLY)
Abs Immature Granulocytes: 0.03 K/uL (ref 0.00–0.07)
Basophils Absolute: 0.1 K/uL (ref 0.0–0.1)
Basophils Relative: 2 %
Eosinophils Absolute: 0.2 K/uL (ref 0.0–0.5)
Eosinophils Relative: 4 %
HCT: 30.8 % — ABNORMAL LOW (ref 39.0–52.0)
Hemoglobin: 10 g/dL — ABNORMAL LOW (ref 13.0–17.0)
Immature Granulocytes: 1 %
Lymphocytes Relative: 23 %
Lymphs Abs: 1.1 K/uL (ref 0.7–4.0)
MCH: 33.3 pg (ref 26.0–34.0)
MCHC: 32.5 g/dL (ref 30.0–36.0)
MCV: 102.7 fL — ABNORMAL HIGH (ref 80.0–100.0)
Monocytes Absolute: 0.4 K/uL (ref 0.1–1.0)
Monocytes Relative: 8 %
Neutro Abs: 2.9 K/uL (ref 1.7–7.7)
Neutrophils Relative %: 62 %
Platelet Count: 243 K/uL (ref 150–400)
RBC: 3 MIL/uL — ABNORMAL LOW (ref 4.22–5.81)
RDW: 14.3 % (ref 11.5–15.5)
WBC Count: 4.7 K/uL (ref 4.0–10.5)
nRBC: 0 % (ref 0.0–0.2)

## 2024-03-20 LAB — CMP (CANCER CENTER ONLY)
ALT: 17 U/L (ref 0–44)
AST: 22 U/L (ref 15–41)
Albumin: 4 g/dL (ref 3.5–5.0)
Alkaline Phosphatase: 111 U/L (ref 38–126)
Anion gap: 10 (ref 5–15)
BUN: 20 mg/dL (ref 8–23)
CO2: 25 mmol/L (ref 22–32)
Calcium: 9.9 mg/dL (ref 8.9–10.3)
Chloride: 103 mmol/L (ref 98–111)
Creatinine: 1.07 mg/dL (ref 0.61–1.24)
GFR, Estimated: >60 mL/min (ref >60)
Glucose, Bld: 157 mg/dL — ABNORMAL HIGH (ref 70–99)
Potassium: 4.3 mmol/L (ref 3.5–5.1)
Sodium: 138 mmol/L (ref 135–145)
Total Bilirubin: 0.7 mg/dL (ref 0.0–1.2)
Total Protein: 8.4 g/dL — ABNORMAL HIGH (ref 6.5–8.1)

## 2024-03-20 LAB — IRON AND TIBC
Iron: 131 ug/dL (ref 45–182)
Saturation Ratios: 35 % (ref 17.9–39.5)
TIBC: 370 ug/dL (ref 250–450)
UIBC: 239 ug/dL

## 2024-03-20 LAB — VITAMIN B12: Vitamin B-12: 708 pg/mL (ref 180–914)

## 2024-03-20 LAB — FERRITIN: Ferritin: 59 ng/mL (ref 24–336)

## 2024-03-21 DIAGNOSIS — M25512 Pain in left shoulder: Secondary | ICD-10-CM | POA: Diagnosis not present

## 2024-03-21 DIAGNOSIS — M25612 Stiffness of left shoulder, not elsewhere classified: Secondary | ICD-10-CM | POA: Diagnosis not present

## 2024-03-21 LAB — KAPPA/LAMBDA LIGHT CHAINS
Kappa free light chain: 725.6 mg/L — ABNORMAL HIGH (ref 3.3–19.4)
Kappa, lambda light chain ratio: 83.4 — ABNORMAL HIGH (ref 0.26–1.65)
Lambda free light chains: 8.7 mg/L (ref 5.7–26.3)

## 2024-03-22 LAB — MULTIPLE MYELOMA PANEL, SERUM
Albumin SerPl Elph-Mcnc: 3.2 g/dL (ref 2.9–4.4)
Albumin/Glob SerPl: 0.8 (ref 0.7–1.7)
Alpha 1: 0.2 g/dL (ref 0.0–0.4)
Alpha2 Glob SerPl Elph-Mcnc: 1 g/dL (ref 0.4–1.0)
B-Globulin SerPl Elph-Mcnc: 1 g/dL (ref 0.7–1.3)
Gamma Glob SerPl Elph-Mcnc: 2.2 g/dL — ABNORMAL HIGH (ref 0.4–1.8)
Globulin, Total: 4.4 g/dL — ABNORMAL HIGH (ref 2.2–3.9)
IgA: 53 mg/dL — ABNORMAL LOW (ref 61–437)
IgG (Immunoglobin G), Serum: 2766 mg/dL — ABNORMAL HIGH (ref 603–1613)
IgM (Immunoglobulin M), Srm: 113 mg/dL (ref 15–143)
M Protein SerPl Elph-Mcnc: 2 g/dL — ABNORMAL HIGH
Total Protein ELP: 7.6 g/dL (ref 6.0–8.5)

## 2024-03-24 DIAGNOSIS — M25512 Pain in left shoulder: Secondary | ICD-10-CM | POA: Diagnosis not present

## 2024-03-24 DIAGNOSIS — M25612 Stiffness of left shoulder, not elsewhere classified: Secondary | ICD-10-CM | POA: Diagnosis not present

## 2024-03-28 DIAGNOSIS — M25512 Pain in left shoulder: Secondary | ICD-10-CM | POA: Diagnosis not present

## 2024-03-28 DIAGNOSIS — M25612 Stiffness of left shoulder, not elsewhere classified: Secondary | ICD-10-CM | POA: Diagnosis not present

## 2024-03-30 ENCOUNTER — Inpatient Hospital Stay: Admitting: Internal Medicine

## 2024-03-30 ENCOUNTER — Telehealth: Payer: Self-pay | Admitting: Pharmacy Technician

## 2024-03-30 ENCOUNTER — Telehealth: Payer: Self-pay | Admitting: Pharmacist

## 2024-03-30 ENCOUNTER — Telehealth: Payer: Self-pay

## 2024-03-30 ENCOUNTER — Encounter: Payer: Self-pay | Admitting: Internal Medicine

## 2024-03-30 ENCOUNTER — Inpatient Hospital Stay

## 2024-03-30 ENCOUNTER — Other Ambulatory Visit (HOSPITAL_COMMUNITY): Payer: Self-pay

## 2024-03-30 VITALS — BP 113/55 | HR 80 | Temp 97.7°F | Resp 16 | Ht 69.0 in | Wt 180.9 lb

## 2024-03-30 DIAGNOSIS — C9002 Multiple myeloma in relapse: Secondary | ICD-10-CM | POA: Insufficient documentation

## 2024-03-30 MED ORDER — ONDANSETRON HCL 8 MG PO TABS: 8.0000 mg | ORAL_TABLET | Freq: Three times a day (TID) | ORAL | 1 refills | Status: AC | PRN

## 2024-03-30 MED ORDER — PROCHLORPERAZINE MALEATE 10 MG PO TABS: 10.0000 mg | ORAL_TABLET | Freq: Four times a day (QID) | ORAL | 1 refills | Status: AC | PRN

## 2024-03-30 NOTE — Progress Notes (Signed)
 START ON PATHWAY REGIMEN - Multiple Myeloma and Other Plasma Cell Dyscrasias     A cycle is every 21 days:     Lenalidomide      Dexamethasone      Bortezomib   **Always confirm dose/schedule in your pharmacy ordering system**  Patient Characteristics: Multiple Myeloma, Newly Diagnosed, Transplant Ineligible or Deferred, Standard Risk Disease Classification: Multiple Myeloma Therapeutic Status: Newly Diagnosed R2-ISS Staging: I Is Patient Eligible for Transplant<= Transplant Ineligible or Deferred Risk Status: Standard Risk Intent of Therapy: Non-Curative / Palliative Intent, Discussed with Patient

## 2024-03-30 NOTE — Telephone Encounter (Signed)
 Oral Oncology Patient Advocate Encounter  Prior Authorization for Lenalidomide has been approved.    PA# [id number] Effective dates: 03/30/2024 through 03/30/2025  Patients co-pay is $0.    [place] (Patty) Chet Burnet, CPhT  [facility], Zelda Salmon, [facility] - Oral Chemotherapy Patient Advocate Specialist III Phone: [phone]  Fax: [phone]

## 2024-03-30 NOTE — Telephone Encounter (Signed)
 Oral Oncology Patient Advocate Encounter   New authorization   Received notification that prior authorization for Lenalidomide is required.   PA submitted on CMM via Latent Key [id number] Status is pending     Soul [place] (Patty) Chet Burnet, CPhT  [facility], Zelda Salmon, [facility] - Oral Chemotherapy Patient Advocate Specialist III Phone: [phone]  Fax: [phone]

## 2024-03-30 NOTE — Assessment & Plan Note (Addendum)
#   MULTIPLE MYELOMA: ELEVATED M protein:MARCH 2023- IgGK- 1.6 /dl; K/L= 33 [anemia-see above; no hypercalcemia no renal insufficiency; ?  Bone lesions-no x-rays done] OCT  2024- M protein- 1.7; K/L ratio: 57. DEC 12th, Bone marrow Biopsy:  The bone marrow is hypercellular for age with increased number of atypical plasma cells representing 14%;  The background shows trilineage hematopoiesis with generally mild nonspecific changes.  Features  diagnostic of a myeloid neoplastic process are not present. Cytogenetic: FISH: Gain of 11/ q- standard risk. JAN 2025- PET scan- negative for any bone lesions.   # Patient noted to have progressive anemia-without any other organ dysfunction.  However given the rising M protein/kappa lambda light chain ratio recommend initiating therapy before any significant organ dysfunction occurs.  Understands treatments are palliative not curative.  # Discussed the mechanism of action of each drug-Revlimid immunomodulatory; Velcade targeted therapy; dexamethasone -cytotoxic.  Discussed the potential side effects-Velcade neuropathy diarrhea neutropenia; Revlimid-teratogenic side effects; skin rash diarrhea; thromboembolic events [aspirin  prophylaxis]; dexamethasone -elevated blood sugars/weight gain fluid retention etc. discussed with pharmacy-plan to start Revlimid at 15 mg 2 weeks on 1 week off..  However if not tolerating well would cut down the dose to 10 mg.  Considering the PET scan next visit  # March 2022 [Dr.Sparks]-small volume PE/ RIGHT LE DVT- Positive for deep venous thrombosis in the right lower extremity [Above Knee- fem]; August 2022-Given recurrent DVT; prior history of PE--currently on indefinite anticoagulation.  No Active Bleeding.- stable  # Intermittent thrombocytopenia isolated  > 100- mild monitor for now-while on Eliquis . Stable.    # Hx o PVD [s/p stenting]- on eliquis - [Dr.Dew]  # DISPOSITION: # Chemo education re: RVD # HOLD venofer  today # follow up in  5 weeks-  MD; labs- - cbc/cmp; ; MM panel; K/L light chains; chemo as per IS-- Dr.B   # 40 minutes face-to-face with the patient discussing the above plan of care; more than 50% of time spent on prognosis/ natural history; counseling and coordination.

## 2024-03-30 NOTE — Progress Notes (Unsigned)
 [facility] CONSULT NOTE  Patient Care Team: Auston Reyes BIRCH, MD as PCP - General (Internal Medicine) Rennie Cindy SAUNDERS, MD as Consulting Physician (Oncology)  CHIEF COMPLAINTS/PURPOSE OF CONSULTATION: ANEMIA  HEMATOLOGY HISTORY:  # ANEMIA NORMOCYTIC  [JAN 2023- PCP-hemoglobin 8.7; saturation 7%; ferritin 23-LL-28]EGD/ colonoscopy-JAN 2023 [[facility]]; NO CT-A/P or capsule.   # MARCH 2023-possible MGUS; IgG kappa 1.6 g; kappa/lambda light chain ratio 33  March 2022-PE small volume right lower lobe; and right lower extremity above-knee DVT.  On Eliquis ; OFF in March 2023; JUNE 15th-DVT-femoral/popliteal nonocclusive-started on Eliquis - INDEFINITE   # Hx of PVD [Dr.Dew]   Latest Reference Range & Units Most Recent 01/29/22 07:55 08/11/22 07:55 02/10/23 08:18  IgG (Immunoglobin G), Serum 603 - 1,613 mg/dL 7,879 (H) 89/76/75 91:81 1,975 (H) 2,058 (H) 2,120 (H)  IgM (Immunoglobulin M), Srm 15 - 143 mg/dL 865 89/76/75 91:81 836 (H) 156 (H) 134  IgA 61 - 437 mg/dL 66 89/76/75 91:81 75 81 66 she Not Please so [name]  Kappa free light chain 3.3 - 19.4 mg/L 322.9 (H) 02/10/23 08:18 293.7 (H) 239.9 (H) 322.9 (H)  Lambda free light chains 5.7 - 26.3 mg/L 5.6 (L) 02/10/23 08:18 8.9 9.2 5.6 (L)  Kappa, lambda light chain ratio 0.26 - 1.65  57.66 (H) 02/10/23 08:18 33.00 (H) 26.08 (H) 57.66 (H)  (H): Data is abnormally high (L): Data is abnormally low    HISTORY OF PRESENTING ILLNESS: Patient accompanied by his wife.  Ambulating independently.  Slightly hard of hearing.  [name] 83 y.o.  male iron  deficient anemia-unclear etiology; history of DVT/PE-recurrent on eliquis ; IV iron  infusions for iron  deficiency, and a history of active myeloma on surveillance [patient preference] is here for a follow up.   Discussed the use of AI scribe software for clinical note transcription with the patient, who gave verbal consent to  proceed.  History of Present Illness   JMARI PELC is an 83 year old male with relapsed multiple myeloma who presents for routine follow-up due to rising M protein and progressive anemia.  He has been managed with surveillance and has not received prior chemotherapy or myeloma-directed therapy. Over the past two years, his M protein remained stable between 1.3 and 1.6, but has recently increased from 1.6 in August to 2.0 in December. Hemoglobin has declined from 12-13 two years ago to 10 currently.  He denies new symptoms attributable to myeloma, including bone pain, fatigue, or constitutional symptoms, and states he feels pretty good overall.  He is recovering from recent shoulder surgery and no longer requires a sling. He and his spouse report satisfactory postoperative recovery.  He also has type 2 diabetes, which is generally well controlled, with recent blood glucose levels around 7.     Lt shoulder replacement last month, [facility]- recoveing well.    Review of Systems  Constitutional:  Positive for malaise/fatigue. Negative for chills, diaphoresis, fever and weight loss.  HENT:  Negative for nosebleeds and sore throat.   Eyes:  Negative for double vision.  Respiratory:  Negative for cough, hemoptysis, sputum production, shortness of breath and wheezing.   Cardiovascular:  Negative for chest pain, palpitations, orthopnea and leg swelling.  Gastrointestinal:  Negative for abdominal pain, blood in stool, constipation, diarrhea, heartburn, melena, nausea and vomiting.  Genitourinary:  Negative for dysuria, frequency and urgency.  Musculoskeletal:  Positive for joint pain. Negative for back pain.  Skin: Negative.  Negative for itching and rash.  Neurological:  Negative for dizziness, tingling, focal weakness, weakness and headaches.  Endo/Heme/Allergies:  Does not bruise/bleed easily.  Psychiatric/Behavioral:  Negative for depression. The patient is not nervous/anxious and does  not have insomnia.     MEDICAL HISTORY:  Past Medical History:  Diagnosis Date   Actinic keratosis    Acute deep vein thrombosis (DVT) of distal vein of right lower extremity (HCC) 06/19/2020   Aortic atherosclerosis    Bladder carcinoma (HCC) 05/26/2007   a.) papillary-transitional cell   BPH (benign prostatic hyperplasia)    Colon polyps    COPD (chronic obstructive pulmonary disease) (HCC)    Current use of long term anticoagulation    a.) Apixaban    Diverticulosis    DOE (dyspnea on exertion)    History of SCC (squamous cell carcinoma) of skin 01/06/2023   left deltoid  ED&C done 01/06/2023   HOH (hard of hearing)    a.) uses BILATERAL assistive devices   Hyperlipemia    Hypertension    Mass    Lt lower cheek   Melanoma (HCC) 2000   Tx by Dr Charlott   PAD (peripheral artery disease)    Peripheral vascular disease    Pulmonary embolism (HCC) 06/19/2020   a.) RLL; no associated RIGHT heart strain   RBBB (right bundle branch block)    Squamous cell carcinoma in situ (SCCIS) 06/08/2023   left infra axillary txt with ED&C   Squamous cell carcinoma of skin 02/01/2017   Left dorsum latera. hand. EDC   Squamous cell carcinoma of skin 02/01/2017   Left dorsum base of thumb. EDC   T2DM (type 2 diabetes mellitus) (HCC)     SURGICAL HISTORY: Past Surgical History:  Procedure Laterality Date   back skin cancer     BLADDER SURGERY     CARDIAC CATHETERIZATION     CATARACT EXTRACTION W/PHACO Right 04/30/2015   Procedure: CATARACT EXTRACTION PHACO AND INTRAOCULAR LENS PLACEMENT (IOC);  Surgeon: Elsie Carmine, MD;  Location: [facility] ORS;  Service: Ophthalmology;  Laterality: Right;  US  01:03 AP% 21.4 CDE  13.57 fluid pack lot # [id number]   CATARACT EXTRACTION W/PHACO Left 05/21/2015   Procedure: CATARACT EXTRACTION PHACO AND INTRAOCULAR LENS PLACEMENT (IOC);  Surgeon: Elsie Carmine, MD;  Location: [facility] ORS;  Service: Ophthalmology;  Laterality: Left;  US  00:52 AP% 18.9 CDE  9.95 fluid pack lot #[id number]   COLONOSCOPY WITH PROPOFOL  N/A 11/05/2014   Procedure: COLONOSCOPY WITH PROPOFOL ;  Surgeon: Deward CINDERELLA Piedmont, MD;  Location: [facility] ENDOSCOPY;  Service: Gastroenterology;  Laterality: N/A;   COLONOSCOPY WITH PROPOFOL  N/A 05/19/2021   Procedure: COLONOSCOPY WITH PROPOFOL ;  Surgeon: Onita Elspeth Sharper, DO;  Location: [facility] ENDOSCOPY;  Service: Gastroenterology;  Laterality: N/A;  DM Patient is hard of hearing and would like for wife to answer questions, please.   ENDARTERECTOMY FEMORAL Right 02/26/2021   Procedure: ENDARTERECTOMY FEMORAL (SFA STENT PLACEMEN);  Surgeon: Marea Selinda RAMAN, MD;  Location: [facility] ORS;  Service: Vascular;  Laterality: Right;   ESOPHAGOGASTRODUODENOSCOPY (EGD) WITH PROPOFOL  N/A 05/19/2021   Procedure: ESOPHAGOGASTRODUODENOSCOPY (EGD) WITH PROPOFOL ;  Surgeon: Onita Elspeth Sharper, DO;  Location: [facility] ENDOSCOPY;  Service: Gastroenterology;  Laterality: N/A;   HERNIA REPAIR     inguinal   IR BONE MARROW BIOPSY & ASPIRATION  03/23/2023   KYPHOPLASTY N/A 02/23/2019   Procedure: T12 KYPHOPLASTY;  Surgeon: Kathlynn Sharper, MD;  Location: [facility] ORS;  Service: Orthopedics;  Laterality: N/A;   LESION EXCISION N/A 02/23/2018   Procedure: EXCISION SCALP CYST AND FACIAL CYST;  Surgeon: Rodolph Romano, MD;  Location: [facility] ORS;  Service: General;  Laterality: N/A;   LOWER EXTREMITY ANGIOGRAPHY Right 01/23/2021   Procedure: LOWER EXTREMITY ANGIOGRAPHY;  Surgeon: Marea Selinda RAMAN, MD;  Location: [facility] INVASIVE CV LAB;  Service: Cardiovascular;  Laterality: Right;   LOWER EXTREMITY ANGIOGRAPHY Right 01/12/2022   Procedure: Lower Extremity Angiography;  Surgeon: Marea Selinda RAMAN, MD;  Location: [facility] INVASIVE CV LAB;  Service: Cardiovascular;  Laterality: Right;   LOWER EXTREMITY ANGIOGRAPHY Right 01/26/2022   Procedure: Lower Extremity Angiography;  Surgeon: Marea Selinda RAMAN, MD;  Location: [facility] INVASIVE CV LAB;  Service: Cardiovascular;  Laterality: Right;   SHOULDER ARTHROSCOPY W/  ROTATOR CUFF REPAIR Right    TUR-BT      SOCIAL HISTORY: Social History   Socioeconomic History   Marital status: Married    Spouse name: Not on file   Number of children: Not on file   Years of education: Not on file   Highest education level: Not on file  Occupational History   Not on file  Tobacco Use   Smoking status: Every Day    Current packs/day: 0.50    Types: Cigarettes   Smokeless tobacco: Never  Vaping Use   Vaping status: Never Used  Substance and Sexual Activity   Alcohol  use: Yes    Alcohol /week: 6.0 standard drinks of alcohol     Types: 6 Cans of beer per week   Drug use: No   Sexual activity: Not on file  Other Topics Concern   Not on file  Social History Narrative   Lives in [place]; with wife. 2 children/near by. Smokes 1/2 ppd; 3-4 beers/weekend. Retired in 2010; patent examiner.    Social Drivers of Health   Tobacco Use: High Risk (03/30/2024)   Patient History    Smoking Tobacco Use: Every Day    Smokeless Tobacco Use: Never    Passive Exposure: Not on file  Financial Resource Strain: Low Risk  (03/03/2024)   Received from [facility]   Overall Financial Resource Strain (CARDIA)    Difficulty of Paying Living Expenses: Not hard at all  Food Insecurity: No Food Insecurity (03/03/2024)   Received from [facility]   Epic    Within the past 12 months, you worried that your food would run out before you got the money to buy more.: Never true    Within the past 12 months, the food you bought just didn't last and you didn't have money to get more.: Never true  Transportation Needs: No Transportation Needs (03/03/2024)   Received from [facility] - Transportation    In the past 12 months, has lack of transportation kept you from medical appointments or from getting medications?: No    Lack of Transportation (Non-Medical): No  Physical Activity: Not on file  Stress:  Not on file  Social Connections: Not on file  Intimate Partner Violence: Not on file  Depression [id number]): Low Risk (03/30/2024)   Depression (PHQ2-9)    PHQ-2 Score: 0  Alcohol  Screen: Not on file  Housing: Low Risk  (03/03/2024)   Received from [facility]   Epic    In the last 12 months, was there a time when you were not able to pay the mortgage or rent on time?: No    In the past 12 months, how many times have you moved where you were living?: 0    At any time  in the past 12 months, were you homeless or living in a shelter (including now)?: No  Utilities: Not At Risk (03/03/2024)   Received from [facility]   Epic    In the past 12 months has the electric, gas, oil, or water company threatened to shut off services in your home?: No  Health Literacy: Not on file    FAMILY HISTORY: Family History  Problem Relation Age of Onset   Diabetes Mother    Colon cancer Mother    Diabetes Father     ALLERGIES:  has no known allergies.  MEDICATIONS:  Current Outpatient Medications  Medication Sig Dispense Refill   apixaban  (ELIQUIS ) 5 MG TABS tablet Take 1 tablet (5 mg total) by mouth 2 (two) times daily. 180 tablet 1   Artificial Tear Solution (SOOTHE XP) SOLN Place 1 drop into both eyes 3 (three) times daily as needed (dry eyes).     Ascorbic Acid (VITAMIN C PO) Take 1 tablet by mouth daily.     Fe Bisgly-Vit C-Vit B12-FA (GENTLE IRON  PO) Take 1 tablet by mouth in the morning and at bedtime.     finasteride  (PROSCAR ) 5 MG tablet Take 5 mg by mouth daily.     glimepiride (AMARYL) 4 MG tablet Take 4 mg by mouth in the morning and at bedtime.     hydrochlorothiazide  (HYDRODIURIL ) 25 MG tablet Take 25 mg by mouth daily.     Misc Natural Products (URINOZINC PLUS PO) Take 1 tablet by mouth 2 (two) times daily.      pioglitazone (ACTOS) 30 MG tablet Take 30 mg by mouth daily.     telmisartan (MICARDIS) 80 MG tablet Take 80 mg by mouth daily.     No  current facility-administered medications for this visit.      PHYSICAL EXAMINATION:   Vitals:   03/30/24 0908  BP: (!) 113/55  Pulse: 80  Resp: 16  Temp: 97.7 F (36.5 C)  SpO2: 98%    Filed Weights   03/30/24 0908  Weight: 180 lb 14.4 oz (82.1 kg)     Physical Exam Vitals and nursing note reviewed.  HENT:     Head: Normocephalic and atraumatic.     Mouth/Throat:     Pharynx: Oropharynx is clear.  Eyes:     Extraocular Movements: Extraocular movements intact.     Pupils: Pupils are equal, round, and reactive to light.  Cardiovascular:     Rate and Rhythm: Normal rate and regular rhythm.  Pulmonary:     Comments: Decreased breath sounds bilaterally.  Abdominal:     Palpations: Abdomen is soft.  Musculoskeletal:        General: Normal range of motion.     Cervical back: Normal range of motion.  Skin:    General: Skin is warm.  Neurological:     General: No focal deficit present.     Mental Status: He is alert and oriented to person, place, and time.  Psychiatric:        Behavior: Behavior normal.        Judgment: Judgment normal.     LABORATORY DATA:  I have reviewed the data as listed Lab Results  Component Value Date   WBC 4.7 03/20/2024   HGB 10.0 (L) 03/20/2024   HCT 30.8 (L) 03/20/2024   MCV 102.7 (H) 03/20/2024   PLT 243 03/20/2024   Recent Labs    09/17/23 0813 12/17/23 0801 03/20/24 0811  NA 134* 137 138  K  3.8 3.6 4.3  CL 103 104 103  CO2 23 26 25   GLUCOSE 146* 85 157*  BUN 26* 26* 20  CREATININE 1.15 1.06 1.07  CALCIUM 9.1 9.3 9.9  GFRNONAA >60 >60 >60  PROT 8.1 7.8 8.4*  ALBUMIN 4.0 3.9 4.0  AST 26 27 22   ALT 25 21 17   ALKPHOS 69 66 111  BILITOT 0.8 0.6 0.7     No results found.    Multiple myeloma in relapse (HCC) # MULTIPLE MYELOMA: ELEVATED M protein:MARCH 2023- IgGK- 1.6 /dl; K/L= 33 [anemia-see above; no hypercalcemia no renal insufficiency; ?  Bone lesions-no x-rays done] OCT  2024- M protein- 1.7; K/L ratio:  57. DEC 12th, Bone marrow Biopsy:  The bone marrow is hypercellular for age with increased number of atypical plasma cells representing 14%;  The background shows trilineage hematopoiesis with generally mild nonspecific changes.  Features  diagnostic of a myeloid neoplastic process are not present. Cytogenetic: FISH: Gain of 11/ q- standard risk. JAN 2025- PET scan- negative for any bone lesions.   # Again reviewed that patient's diagnosis is consistent with diagnosis of multiple myeloma-given the mild - moderate anemia [see below]-without any evidence of any other endorgan damage-hypercalcemia or acute renal failure or bone lesions on PET scan.  Patient is not a candidate for stem cell transplant given his age/comorbidities.    # AUG 2025-SLOW RISE in -M protein 1.6 g/dL kappa lambda light chain ratio 40- with mild anemia but no renal insufficiency or hypercalcemia-clinically stable/myeloma labs slowly progressing over time.  Given patient preference continue surveillance.  Again reviewed the treatment options include pills shots.   # Mild- moderate anemia-multifactorial  iron  deficiency/multiple myeloma-SEP 2025-hemoglobin 10.5-   ferritin-20];-status post Venofer  Continue oral iron  BID  HOLD OFF iron  infusion today. Consider at next visit if not improved.   # March 2022 [Dr.Sparks]-small volume PE/ RIGHT LE DVT- Positive for deep venous thrombosis in the right lower extremity [Above Knee- fem]; August 2022-Given recurrent DVT; prior history of PE--currently on indefinite anticoagulation.  No Active Bleeding.- stable  # Intermittent thrombocytopenia isolated  > 100- mild monitor for now-while on Eliquis . Stable.    # Hx o PVD [s/p stenting]- on eliquis - [Dr.Dew]  # DISPOSITION: # Chemo education re: RVD # HOLD venofer  today # follow up in 5 weeks-  MD; labs- - cbc/cmp; ; MM panel; K/L light chains; chemo as per IS-- Dr.B       All questions were answered. The patient knows to call the clinic  with any problems, questions or concerns.    Cindy JONELLE Joe, MD 03/30/2024 10:21 AM

## 2024-03-30 NOTE — Telephone Encounter (Signed)
 Prior auth submitted thru covermymeds: (Key: [id number]) Rx #: [id number] Need Help? Call us  at [phone] Ondansetron  HCl 8MG  tablets

## 2024-03-30 NOTE — Progress Notes (Unsigned)
 Lt shoulder replacement last month, [facility].

## 2024-03-30 NOTE — Telephone Encounter (Signed)
 Clinical Pharmacist Practitioner Encounter   Received new prescription for Revlimid (lenalidomide) for the treatment of multiple myeloma in conjunction with bortezomib and dexamethasone , planned duration until disease control or unacceptable toxicity.  Planned start 05/05/2024.   CMP from 03/20/2024 assessed, no relevant lab abnormalities.  Patient's creatinine clearance is ~ 54 mL/min, patient will be started on the reduced dose of 15 mg to account for renal impairment. Prescription dose and frequency assessed.   Current medication list in Epic reviewed, no relevant DDIs with lenalidomide identified.  Evaluated chart and no patient barriers to medication adherence identified.   Oral Oncology Clinic will continue to follow for insurance authorization, copayment issues, initial counseling and start date.   Peja Allender N. Casara Perrier, PharmD, BCOP, CPP Hematology/Oncology Clinical Pharmacist [facility] Oral Chemotherapy Navigation Clinic [phone]  03/30/2024 11:37 AM

## 2024-03-30 NOTE — Telephone Encounter (Signed)
 Oral Oncology Patient Advocate Encounter   New authorization   Received notification that prior authorization for ondansetron  is required.   PA submitted on CMM via Latent Key [id number] Status is pending     Lavar Rosenzweig (Patty) Chet Burnet, CPhT  [facility], Zelda [place]mon, [facility] - Oral Chemotherapy Patient Advocate Specialist III Phone: [phone]  Fax: [phone]

## 2024-03-31 ENCOUNTER — Encounter: Payer: Self-pay | Admitting: Internal Medicine

## 2024-03-31 ENCOUNTER — Other Ambulatory Visit: Payer: Self-pay

## 2024-04-03 ENCOUNTER — Other Ambulatory Visit (HOSPITAL_COMMUNITY): Payer: Self-pay

## 2024-04-03 ENCOUNTER — Encounter: Payer: Self-pay | Admitting: Internal Medicine

## 2024-04-03 NOTE — Telephone Encounter (Signed)
 Oral Oncology Patient Advocate Encounter  Prior Authorization for ondansetron  has been approved.    Jonathon Barrera (Patty) Chet Burnet, CPhT  [facility], Zelda [place]mon, [facility] - Oral Chemotherapy Patient Advocate Specialist III Phone: [phone]  Fax: [phone]

## 2024-04-21 NOTE — Progress Notes (Signed)
 Pharmacist Chemotherapy Monitoring - Initial Assessment    Anticipated start date: 05/06/23   The following has been reviewed per standard work regarding the patient's treatment regimen: The patient's diagnosis, treatment plan and drug doses, and organ/hematologic function Lab orders and baseline tests specific to treatment regimen  The treatment plan start date, drug sequencing, and pre-medications Prior authorization status  Patient's documented medication list, including drug-drug interaction screen and prescriptions for anti-emetics and supportive care specific to the treatment regimen The drug concentrations, fluid compatibility, administration routes, and timing of the medications to be used The patient's access for treatment and lifetime cumulative dose history, if applicable  The patient's medication allergies and previous infusion related reactions, if applicable  Multiple myeloma in relapse [facility])  Patient noted to have progressive anemia-without any other organ dysfunction.  However given the rising M protein/kappa lambda light chain ratio recommend initiating therapy before any significant organ dysfunction occurs  Initiate Rvd Changes made to treatment plan:  N/A  Follow up needed:  N/A   Jonathon Barrera, [facility], 04/21/2024  2:39 PM

## 2024-04-24 ENCOUNTER — Other Ambulatory Visit (HOSPITAL_COMMUNITY): Payer: Self-pay

## 2024-04-24 DIAGNOSIS — C9000 Multiple myeloma not having achieved remission: Secondary | ICD-10-CM

## 2024-04-24 MED ORDER — LENALIDOMIDE 15 MG PO CAPS: 15.0000 mg | ORAL_CAPSULE | Freq: Every day | ORAL | 0 refills | Status: DC

## 2024-04-25 ENCOUNTER — Inpatient Hospital Stay: Attending: Internal Medicine

## 2024-04-25 ENCOUNTER — Inpatient Hospital Stay: Admitting: Pharmacist

## 2024-04-25 ENCOUNTER — Telehealth: Payer: Self-pay | Admitting: Pharmacist

## 2024-04-25 ENCOUNTER — Other Ambulatory Visit (HOSPITAL_COMMUNITY): Payer: Self-pay

## 2024-04-25 DIAGNOSIS — C9000 Multiple myeloma not having achieved remission: Secondary | ICD-10-CM

## 2024-04-25 MED ORDER — LENALIDOMIDE 15 MG PO CAPS: 15.0000 mg | ORAL_CAPSULE | Freq: Every day | ORAL | 0 refills | Status: DC

## 2024-04-25 NOTE — Progress Notes (Signed)
 "  Clinical Pharmacist Practitioner Clinic [facility]  [phone] [phone]  Patient Care Team: Auston Reyes BIRCH, MD as PCP - General (Internal Medicine) Rennie Cindy SAUNDERS, MD as Consulting Physician (Oncology)   Name of the patient: Jonathon Barrera  [id number]  [birth year]   Date of visit: 04/25/2024   HPI: Patient is a 84 y.o. male with multiple myeloma.  Planned treatment with lenalidomide , bortezomib, and dexamethasone .  Planned start 05/05/2024.  Reason for Consult: Lenalidomide  oral chemotherapy education.   PAST MEDICAL HISTORY: Past Medical History:  Diagnosis Date   Actinic keratosis    Acute deep vein thrombosis (DVT) of distal vein of right lower extremity (HCC) 06/19/2020   Aortic atherosclerosis    Bladder carcinoma (HCC) 05/26/2007   a.) papillary-transitional cell   BPH (benign prostatic hyperplasia)    Colon polyps    COPD (chronic obstructive pulmonary disease) (HCC)    Current use of long term anticoagulation    a.) Apixaban    Diverticulosis    DOE (dyspnea on exertion)    History of SCC (squamous cell carcinoma) of skin 01/06/2023   left deltoid  ED&C done 01/06/2023   HOH (hard of hearing)    a.) uses BILATERAL assistive devices   Hyperlipemia    Hypertension    Mass    Lt lower cheek   Melanoma (HCC) 2000   Tx by Dr Charlott   PAD (peripheral artery disease)    Peripheral vascular disease    Pulmonary embolism (HCC) 06/19/2020   a.) RLL; no associated RIGHT heart strain   RBBB (right bundle branch block)    Squamous cell carcinoma in situ (SCCIS) 06/08/2023   left infra axillary txt with ED&C   Squamous cell carcinoma of skin 02/01/2017   Left dorsum latera. hand. EDC   Squamous cell carcinoma of skin 02/01/2017   Left dorsum base of thumb. EDC   T2DM (type 2 diabetes mellitus) [facility])     HEMATOLOGY/ONCOLOGY HISTORY:  Oncology History  Multiple myeloma in relapse (HCC)  03/30/2024 Initial Diagnosis    Multiple myeloma in relapse (HCC)   03/30/2024 Cancer Staging   Staging form: Plasma Cell Myeloma and Plasma Cell Disorders, AJCC 8th Edition - Clinical: RISS Stage I (Beta-2 -microglobulin (mg/L): 2.2, Albumin (g/dL): 4, ISS: Stage I, High-risk cytogenetics: Absent, LDH: Normal) - Signed by Rennie Cindy SAUNDERS, MD on 03/30/2024 Beta 2 microglobulin range (mg/L): Less than 3.5 Albumin range (g/dL): Greater than or equal to 3.5 Cytogenetics: Trisomy 11   05/04/2024 -  Chemotherapy   Patient is on Treatment Plan : MYELOMA NON-TRANSPLANT CANDIDATES VRd weekly q21d       ALLERGIES:  has no known allergies.  MEDICATIONS:  Current Outpatient Medications  Medication Sig Dispense Refill   acyclovir (ZOVIRAX) 400 MG tablet Take 1 tablet (400 mg total) by mouth 2 (two) times daily. 60 tablet 3   apixaban  (ELIQUIS ) 5 MG TABS tablet Take 1 tablet (5 mg total) by mouth 2 (two) times daily. 180 tablet 1   Artificial Tear Solution (SOOTHE XP) SOLN Place 1 drop into both eyes 3 (three) times daily as needed (dry eyes).     Ascorbic Acid (VITAMIN C PO) Take 1 tablet by mouth daily.     Fe Bisgly-Vit C-Vit B12-FA (GENTLE IRON  PO) Take 1 tablet by mouth in the morning and at bedtime.     finasteride  (PROSCAR ) 5 MG tablet Take 5 mg by mouth daily.     glimepiride (AMARYL) 4 MG tablet Take 4 mg  by mouth in the morning and at bedtime.     hydrochlorothiazide  (HYDRODIURIL ) 25 MG tablet Take 25 mg by mouth daily.     lenalidomide  (REVLIMID ) 15 MG capsule Take 1 capsule (15 mg total) by mouth daily. Take for 14 days, then hold for 7 days. Repeat every 21 days. 14 capsule 0   Misc Natural Products (URINOZINC PLUS PO) Take 1 tablet by mouth 2 (two) times daily.      ondansetron  (ZOFRAN ) 8 MG tablet Take 1 tablet (8 mg total) by mouth every 8 (eight) hours as needed for nausea or vomiting. 30 tablet 1   pioglitazone (ACTOS) 30 MG tablet Take 30 mg by mouth daily.     prochlorperazine  (COMPAZINE ) 10 MG tablet Take  1 tablet (10 mg total) by mouth every 6 (six) hours as needed for nausea or vomiting. 30 tablet 1   telmisartan (MICARDIS) 80 MG tablet Take 80 mg by mouth daily.     No current facility-administered medications for this visit.    VITAL SIGNS: BP 128/72   Pulse 74   Temp 97.6 F (36.4 C)   Wt 82.8 kg (182 lb 9.6 oz)   BMI 26.97 kg/m  Filed Weights   04/25/24 1124  Weight: 82.8 kg (182 lb 9.6 oz)    Estimated body mass index is 26.97 kg/m as calculated from the following:   Height as of 03/30/24: 5' 9 (1.753 m).   Weight as of this encounter: 82.8 kg (182 lb 9.6 oz).  LABS: CBC:    Component Value Date/Time   WBC 4.7 03/20/2024 0811   WBC 3.3 (L) 03/23/2023 0846   HGB 10.0 (L) 03/20/2024 0811   HCT 30.8 (L) 03/20/2024 0811   PLT 243 03/20/2024 0811   MCV 102.7 (H) 03/20/2024 0811   NEUTROABS 2.9 03/20/2024 0811   LYMPHSABS 1.1 03/20/2024 0811   MONOABS 0.4 03/20/2024 0811   EOSABS 0.2 03/20/2024 0811   BASOSABS 0.1 03/20/2024 0811   Comprehensive Metabolic Panel:    Component Value Date/Time   NA 138 03/20/2024 0811   K 4.3 03/20/2024 0811   K 3.9 02/23/2012 0928   CL 103 03/20/2024 0811   CO2 25 03/20/2024 0811   BUN 20 03/20/2024 0811   CREATININE 1.07 03/20/2024 0811   GLUCOSE 157 (H) 03/20/2024 0811   CALCIUM 9.9 03/20/2024 0811   AST 22 03/20/2024 0811   ALT 17 03/20/2024 0811   ALKPHOS 111 03/20/2024 0811   BILITOT 0.7 03/20/2024 0811   PROT 8.4 (H) 03/20/2024 0811   ALBUMIN 4.0 03/20/2024 0811     Present during today's visit: Patient and his wife  Start plan: Patient to start lenalidomide  on 05/05/2024 in coordination with in office treatment   Patient Education I spoke with patient for overview of new oral chemotherapy medication: Lenalidomide   Treatment goal: Control  Administration: Counseled patient on administration, dosing, side effects, monitoring, drug-food interactions, safe handling, storage, and disposal. Patient will take 1  capsule (15 mg total) by mouth daily. Take for 14 days, then hold for 7 days. Repeat every 21 days   Side Effects: Side effects include but not limited to: Rash, constipation or diarrhea, fatigue, nausea, decreased WBC/Hgb/plt.   Rash: patient knows to call the office if rash occurs Diarrhea: patient knows to use loperamide as needed and call the office if they are having four or more loose stool per day  Patient is currently taking apixaban  which will work for lenalidomide  thromboprophylaxis  Drug-drug Interactions (DDI): No  current interactions with lenalidomide   Adherence: After discussion with patient no patient barriers to medication adherence identified.  Reviewed with patient importance of keeping a medication schedule and plan for any missed doses.  Distress evaluation: Completed during chemo education class  Communication and Learning Assessment Primary learner: Patient and his wife Barriers to learning: No barriers Preferred language: English Learning preferences: Listening Reading  The [place] voiced understanding and appreciation. All questions answered. Medication handout provided.  Provided patient with Oral Chemotherapy Navigation Clinic phone number. Patient knows to call the office with questions or concerns. Oral Chemotherapy Navigation Clinic will continue to follow.  Patient expressed understanding and was in agreement with this plan. He also understands that He can call clinic at any time with any questions, concerns, or complaints.   Medication Access Issues: Prescription sent to [facility], patient will follow-up with them later this week about medication delivery, grant obtained and shared with [facility]  Follow-up plan: Return to clinic as scheduled  Thank you for allowing me to participate in the care of this patient.   Time Total: 30 minutes  Visit consisted of counseling and education on dealing with issues of symptom management in  the setting of serious and potentially life-threatening illness.Greater than 50%  of this time was spent counseling and coordinating care related to the above assessment and plan.  Signed by: Barbara Ahart N. Denzil Bristol, PharmD, NEILA, CPP Hematology/Oncology Clinical Pharmacist Practitioner [place] [phone]  04/25/2024 1:35 PM  "

## 2024-04-25 NOTE — Telephone Encounter (Signed)
 Oral Chemotherapy Pharmacist Encounter  Successfully enrolled patient for copayment assistance funds from [facility] from the multiple myeloma fund.  Award amount: $[id number] [employer] ID: [id number]  Effective dates: 03/26/2024-03/25/2025 ID: [id number] BIN: [id number] Group: [id number] PCN: [id number]  Billing information will be shared with [facility].  Alfonza Toft N. Qadir Folks, PharmD, BCOP, CPP Hematology/Oncology Clinical Pharmacist [facility] [phone]  04/25/2024 12:38 PM

## 2024-04-27 ENCOUNTER — Encounter: Payer: Self-pay | Admitting: Internal Medicine

## 2024-04-28 NOTE — Telephone Encounter (Signed)
 Per [facility] medication was delivered today 04/28/24.

## 2024-04-28 NOTE — Progress Notes (Signed)
 Jonathon Barrera                                          MRN: [id number]   04/28/2024   The [facility] Quality Team Specialist reviewed this patient medical record for the purposes of chart review for care gap closure. The following were reviewed: chart review for care gap closure-kidney health evaluation for diabetes:eGFR  and uACR.    [facility] Quality Team

## 2024-05-04 ENCOUNTER — Inpatient Hospital Stay

## 2024-05-04 ENCOUNTER — Inpatient Hospital Stay: Admitting: Internal Medicine

## 2024-05-05 ENCOUNTER — Encounter: Payer: Self-pay | Admitting: Internal Medicine

## 2024-05-05 ENCOUNTER — Inpatient Hospital Stay: Admitting: Internal Medicine

## 2024-05-05 ENCOUNTER — Inpatient Hospital Stay

## 2024-05-05 VITALS — BP 120/62 | HR 70

## 2024-05-05 VITALS — BP 125/60 | HR 72 | Temp 97.5°F | Resp 16 | Ht 69.0 in | Wt 182.3 lb

## 2024-05-05 DIAGNOSIS — C9002 Multiple myeloma in relapse: Secondary | ICD-10-CM | POA: Diagnosis not present

## 2024-05-05 LAB — CBC WITH DIFFERENTIAL (CANCER CENTER ONLY)
Abs Immature Granulocytes: 0.03 K/uL (ref 0.00–0.07)
Basophils Absolute: 0.1 K/uL (ref 0.0–0.1)
Basophils Relative: 2 %
Eosinophils Absolute: 0.1 K/uL (ref 0.0–0.5)
Eosinophils Relative: 4 %
HCT: 33.7 % — ABNORMAL LOW (ref 39.0–52.0)
Hemoglobin: 10.9 g/dL — ABNORMAL LOW (ref 13.0–17.0)
Immature Granulocytes: 1 %
Lymphocytes Relative: 33 %
Lymphs Abs: 1.1 K/uL (ref 0.7–4.0)
MCH: 33.7 pg (ref 26.0–34.0)
MCHC: 32.3 g/dL (ref 30.0–36.0)
MCV: 104.3 fL — ABNORMAL HIGH (ref 80.0–100.0)
Monocytes Absolute: 0.3 K/uL (ref 0.1–1.0)
Monocytes Relative: 10 %
Neutro Abs: 1.7 K/uL (ref 1.7–7.7)
Neutrophils Relative %: 50 %
Platelet Count: 173 K/uL (ref 150–400)
RBC: 3.23 MIL/uL — ABNORMAL LOW (ref 4.22–5.81)
RDW: 14.8 % (ref 11.5–15.5)
WBC Count: 3.4 K/uL — ABNORMAL LOW (ref 4.0–10.5)
nRBC: 0 % (ref 0.0–0.2)

## 2024-05-05 LAB — CMP (CANCER CENTER ONLY)
ALT: 19 U/L (ref 0–44)
AST: 23 U/L (ref 15–41)
Albumin: 4.3 g/dL (ref 3.5–5.0)
Alkaline Phosphatase: 85 U/L (ref 38–126)
Anion gap: 11 (ref 5–15)
BUN: 26 mg/dL — ABNORMAL HIGH (ref 8–23)
CO2: 23 mmol/L (ref 22–32)
Calcium: 10 mg/dL (ref 8.9–10.3)
Chloride: 99 mmol/L (ref 98–111)
Creatinine: 1.22 mg/dL (ref 0.61–1.24)
GFR, Estimated: 60 mL/min — ABNORMAL LOW
Glucose, Bld: 165 mg/dL — ABNORMAL HIGH (ref 70–99)
Potassium: 3.9 mmol/L (ref 3.5–5.1)
Sodium: 133 mmol/L — ABNORMAL LOW (ref 135–145)
Total Bilirubin: 0.7 mg/dL (ref 0.0–1.2)
Total Protein: 8.7 g/dL — ABNORMAL HIGH (ref 6.5–8.1)

## 2024-05-05 MED ORDER — DEXAMETHASONE 4 MG PO TABS
20.0000 mg | ORAL_TABLET | Freq: Once | ORAL | Status: AC
  Administered 2024-05-05: 20 mg via ORAL
  Filled 2024-05-05: qty 5

## 2024-05-05 MED ORDER — BORTEZOMIB CHEMO SQ INJECTION 3.5 MG (2.5MG/ML)
1.3000 mg/m2 | Freq: Once | INTRAMUSCULAR | Status: AC
  Administered 2024-05-05: 2.5 mg via SUBCUTANEOUS
  Filled 2024-05-05: qty 1

## 2024-05-05 NOTE — Progress Notes (Signed)
 C/o possible pulled muscle rt abdomen area x3 weeks ago when getting out of bed.

## 2024-05-05 NOTE — Progress Notes (Signed)
 [facility] CONSULT NOTE  Patient Care Team: Jonathon Barrera BIRCH, MD as PCP - General (Internal Medicine) Jonathon Jonathon SAUNDERS, MD as Consulting Physician (Oncology)  CHIEF COMPLAINTS/PURPOSE OF CONSULTATION: ANEMIA  HEMATOLOGY HISTORY:  Oncology History Overview Note  # ANEMIA NORMOCYTIC  [JAN 2023- PCP-hemoglobin 8.7; saturation 7%; ferritin 23-LL-28]EGD/ colonoscopy-JAN 2023 [Jonathon Barrera]; NO CT-A/P or capsule.   # MARCH 2023-possible MGUS; IgG kappa 1.6 g; kappa/lambda light chain ratio 33.  # MULTIPLE MYELOMA: ELEVATED M protein:MARCH 2023- IgGK- 1.6 /dl; K/L= 33 [anemia-see above; no hypercalcemia no renal insufficiency; ?  Bone lesions-no x-rays done] OCT  2024- M protein- 1.7; K/L ratio: 57. DEC 12th, Bone marrow Biopsy:  The bone marrow is hypercellular for age with increased number of atypical plasma cells representing 14%;  The background shows trilineage hematopoiesis with generally mild nonspecific changes.  Features  diagnostic of a myeloid neoplastic process are not present. Cytogenetic: FISH: Gain of 11/ q- standard risk. JAN 2025- PET scan- negative for any bone lesions.   # JAN 16th, 2026- Barrera 15mg  - 2w/1;V elcade Dex 07 July 2020-PE small volume right lower lobe; and right lower extremity above-knee DVT.  On Eliquis ; OFF in March 2023; JUNE 15th-DVT-femoral/popliteal nonocclusive-started on Eliquis - INDEFINITE   # Hx of PVD [Dr.Dew]   Multiple myeloma in relapse (HCC)  03/30/2024 Initial Diagnosis   Multiple myeloma in relapse (HCC)   03/30/2024 Cancer Staging   Staging form: Plasma Cell Myeloma and Plasma Cell Disorders, AJCC 8th Edition - Clinical: RISS Stage I (Beta-2 -microglobulin (mg/L): 2.2, Albumin (g/dL): 4, ISS: Stage I, High-risk cytogenetics: Absent, LDH: Normal) - Signed by Jonathon Jonathon SAUNDERS, MD on 03/30/2024 Beta 2 microglobulin range (mg/L): Less than 3.5 Albumin range (g/dL): Greater than or equal to 3.5 Cytogenetics: Trisomy 11    05/05/2024 -  Chemotherapy   Patient is on Treatment Plan : MYELOMA NON-TRANSPLANT CANDIDATES VRd weekly q21d         Latest Reference Range & Units Most Recent 01/29/22 07:55 08/11/22 07:55 02/10/23 08:18  IgG (Immunoglobin G), Serum 603 - 1,613 mg/dL 7,879 (H) 89/76/75 91:81 1,975 (H) 2,058 (H) 2,120 (H)  IgM (Immunoglobulin M), Srm 15 - 143 mg/dL 865 89/76/75 91:81 836 (H) 156 (H) 134  IgA 61 - 437 mg/dL 66 89/76/75 91:81 75 81 66 she Not Please so [name]  Kappa free light chain 3.3 - 19.4 mg/L 322.9 (H) 02/10/23 08:18 293.7 (H) 239.9 (H) 322.9 (H)  Lambda free light chains 5.7 - 26.3 mg/L 5.6 (L) 02/10/23 08:18 8.9 9.2 5.6 (L)  Kappa, lambda light chain ratio 0.26 - 1.65  57.66 (H) 02/10/23 08:18 33.00 (H) 26.08 (H) 57.66 (H)  (H): Data is abnormally high (L): Data is abnormally low    HISTORY OF PRESENTING ILLNESS: Patient accompanied by his wife.  Ambulating independently.  Slightly hard of hearing.  [name] 84 y.o.  male iron  deficient anemia-unclear etiology; history of DVT/PE-recurrent on eliquis ; IV iron  infusions for iron  deficiency, and a history of active myeloma is here for a follow up.   Discussed the use of AI scribe software for clinical note transcription with the patient, who gave verbal consent to proceed.  C/o possible pulled muscle rt abdomen area x3 weeks ago when getting out of bed   History of Present Illness   Jonathon Barrera is an 84 year old male with active multiple myeloma who presents for follow-up and initiation of RVD chemotherapy.  He is being  managed for active multiple myeloma, currently with worsening anemia. He has not yet started RVD chemotherapy and was instructed to begin the oral component today. He denies fatigue, fever, chills, night sweats, new masses, abnormal bleeding, or ecchymosis. He describes a localized area of mild discomfort on his torso, only noticeable with rubbing and not  painful to palpation.  He and his caregiver discussed dietary precautions related to immunosuppression, specifically regarding the safety of consuming head lettuce and cabbage at home versus bagged lettuce or fast food items containing lettuce or slaw. He is currently avoiding fast food and outside salads/slaw, and is consuming home-prepared foods with proper washing of produce. He reports no gastrointestinal symptoms.  He has type 2 diabetes mellitus and monitors blood glucose at home every four days to a week, without significant hyperglycemia and with adequate testing supplies. He was advised to increase monitoring to daily during chemotherapy. He denies polyuria, polydipsia, or polyphagia.       Review of Systems  Constitutional:  Positive for malaise/fatigue. Negative for chills, diaphoresis, fever and weight loss.  HENT:  Negative for nosebleeds and sore throat.   Eyes:  Negative for double vision.  Respiratory:  Negative for cough, hemoptysis, sputum production, shortness of breath and wheezing.   Cardiovascular:  Negative for chest pain, palpitations, orthopnea and leg swelling.  Gastrointestinal:  Negative for abdominal pain, blood in stool, constipation, diarrhea, heartburn, melena, nausea and vomiting.  Genitourinary:  Negative for dysuria, frequency and urgency.  Musculoskeletal:  Positive for joint pain. Negative for back pain.  Skin: Negative.  Negative for itching and rash.  Neurological:  Negative for dizziness, tingling, focal weakness, weakness and headaches.  Endo/Heme/Allergies:  Does not bruise/bleed easily.  Psychiatric/Behavioral:  Negative for depression. The patient is not nervous/anxious and does not have insomnia.     MEDICAL HISTORY:  Past Medical History:  Diagnosis Date   Actinic keratosis    Acute deep vein thrombosis (DVT) of distal vein of right lower extremity (HCC) 06/19/2020   Aortic atherosclerosis    Bladder carcinoma (HCC) 05/26/2007   a.)  papillary-transitional cell   BPH (benign prostatic hyperplasia)    Colon polyps    COPD (chronic obstructive pulmonary disease) (HCC)    Current use of long term anticoagulation    a.) Apixaban    Diverticulosis    DOE (dyspnea on exertion)    History of SCC (squamous cell carcinoma) of skin 01/06/2023   left deltoid  ED&C done 01/06/2023   HOH (hard of hearing)    a.) uses BILATERAL assistive devices   Hyperlipemia    Hypertension    Mass    Lt lower cheek   Melanoma (HCC) 2000   Tx by Dr Jonathon Barrera   PAD (peripheral artery disease)    Peripheral vascular disease    Pulmonary embolism (HCC) 06/19/2020   a.) RLL; no associated RIGHT heart strain   RBBB (right bundle branch block)    Squamous cell carcinoma in situ (SCCIS) 06/08/2023   left infra axillary txt with ED&C   Squamous cell carcinoma of skin 02/01/2017   Left dorsum latera. hand. EDC   Squamous cell carcinoma of skin 02/01/2017   Left dorsum base of thumb. EDC   T2DM (type 2 diabetes mellitus) (HCC)     SURGICAL HISTORY: Past Surgical History:  Procedure Laterality Date   back skin cancer     BLADDER SURGERY     CARDIAC CATHETERIZATION     CATARACT EXTRACTION W/PHACO Right 04/30/2015   Procedure: CATARACT  EXTRACTION PHACO AND INTRAOCULAR LENS PLACEMENT (IOC);  Surgeon: Jonathon Carmine, MD;  Location: [facility] ORS;  Service: Ophthalmology;  Laterality: Right;  US  01:03 AP% 21.4 CDE  13.57 fluid pack lot # [id number]   CATARACT EXTRACTION W/PHACO Left 05/21/2015   Procedure: CATARACT EXTRACTION PHACO AND INTRAOCULAR LENS PLACEMENT (IOC);  Surgeon: Jonathon Carmine, MD;  Location: [facility] ORS;  Service: Ophthalmology;  Laterality: Left;  US  00:52 AP% 18.9 CDE 9.95 fluid pack lot #[id number]   COLONOSCOPY WITH PROPOFOL  N/A 11/05/2014   Procedure: COLONOSCOPY WITH PROPOFOL ;  Surgeon: Jonathon CINDERELLA Piedmont, MD;  Location: [facility] ENDOSCOPY;  Service: Gastroenterology;  Laterality: N/A;   COLONOSCOPY WITH PROPOFOL  N/A 05/19/2021   Procedure:  COLONOSCOPY WITH PROPOFOL ;  Surgeon: Jonathon Elspeth Sharper, DO;  Location: [facility] ENDOSCOPY;  Service: Gastroenterology;  Laterality: N/A;  DM Patient is hard of hearing and would like for wife to answer questions, please.   ENDARTERECTOMY FEMORAL Right 02/26/2021   Procedure: ENDARTERECTOMY FEMORAL (SFA STENT PLACEMEN);  Surgeon: Jonathon Selinda RAMAN, MD;  Location: [facility] ORS;  Service: Vascular;  Laterality: Right;   ESOPHAGOGASTRODUODENOSCOPY (EGD) WITH PROPOFOL  N/A 05/19/2021   Procedure: ESOPHAGOGASTRODUODENOSCOPY (EGD) WITH PROPOFOL ;  Surgeon: Jonathon Elspeth Sharper, DO;  Location: [facility] ENDOSCOPY;  Service: Gastroenterology;  Laterality: N/A;   HERNIA REPAIR     inguinal   IR BONE MARROW BIOPSY & ASPIRATION  03/23/2023   KYPHOPLASTY N/A 02/23/2019   Procedure: T12 KYPHOPLASTY;  Surgeon: Jonathon Sharper, MD;  Location: [facility] ORS;  Service: Orthopedics;  Laterality: N/A;   LESION EXCISION N/A 02/23/2018   Procedure: EXCISION SCALP CYST AND FACIAL CYST;  Surgeon: Jonathon Romano, MD;  Location: [facility] ORS;  Service: General;  Laterality: N/A;   LOWER EXTREMITY ANGIOGRAPHY Right 01/23/2021   Procedure: LOWER EXTREMITY ANGIOGRAPHY;  Surgeon: Jonathon Selinda RAMAN, MD;  Location: [facility] INVASIVE CV LAB;  Service: Cardiovascular;  Laterality: Right;   LOWER EXTREMITY ANGIOGRAPHY Right 01/12/2022   Procedure: Lower Extremity Angiography;  Surgeon: Jonathon Selinda RAMAN, MD;  Location: [facility] INVASIVE CV LAB;  Service: Cardiovascular;  Laterality: Right;   LOWER EXTREMITY ANGIOGRAPHY Right 01/26/2022   Procedure: Lower Extremity Angiography;  Surgeon: Jonathon Selinda RAMAN, MD;  Location: [facility] INVASIVE CV LAB;  Service: Cardiovascular;  Laterality: Right;   SHOULDER ARTHROSCOPY W/ ROTATOR CUFF REPAIR Right    TUR-BT      SOCIAL HISTORY: Social History   Socioeconomic History   Marital status: Married    Spouse name: Not on file   Number of children: Not on file   Years of education: Not on file   Highest education level: Not on file   Occupational History   Not on file  Tobacco Use   Smoking status: Every Day    Current packs/day: 0.50    Types: Cigarettes   Smokeless tobacco: Never  Vaping Use   Vaping status: Never Used  Substance and Sexual Activity   Alcohol  use: Yes    Alcohol /week: 6.0 standard drinks of alcohol     Types: 6 Cans of beer per week   Drug use: No   Sexual activity: Not on file  Other Topics Concern   Not on file  Social History Narrative   Lives in [place]; with wife. 2 children/near by. Smokes 1/2 ppd; 3-4 beers/weekend. Retired in 2010; patent examiner.    Social Drivers of Health   Tobacco Use: High Risk (05/05/2024)   Patient History    Smoking Tobacco Use: Every Day    Smokeless Tobacco Use: Never  Passive Exposure: Not on file  Financial Resource Strain: Low Risk  (03/03/2024)   Received from [facility]   Overall Financial Resource Strain (CARDIA)    Difficulty of Paying Living Expenses: Not hard at all  Food Insecurity: No Food Insecurity (03/03/2024)   Received from [facility]   Epic    Within the past 12 months, you worried that your food would run out before you got the money to buy more.: Never true    Within the past 12 months, the food you bought just didn't last and you didn't have money to get more.: Never true  Transportation Needs: No Transportation Needs (03/03/2024)   Received from [facility] - Transportation    In the past 12 months, has lack of transportation kept you from medical appointments or from getting medications?: No    Lack of Transportation (Non-Medical): No  Physical Activity: Not on file  Stress: Not on file  Social Connections: Not on file  Intimate Partner Violence: Not on file  Depression (PHQ2-9): Low Risk (05/05/2024)   Depression (PHQ2-9)    PHQ-2 Score: 0  Alcohol  Screen: Not on file  Housing: Low Risk  (03/03/2024)   Received from [facility]   Epic    In the last 12 months, was there a time when you were not able to pay the mortgage or rent on time?: No    In the past 12 months, how many times have you moved where you were living?: 0    At any time in the past 12 months, were you homeless or living in a shelter (including now)?: No  Utilities: Not At Risk (03/03/2024)   Received from [facility]   Epic    In the past 12 months has the electric, gas, oil, or water company threatened to shut off services in your home?: No  Health Literacy: Not on file    FAMILY HISTORY: Family History  Problem Relation Age of Onset   Diabetes Mother    Colon cancer Mother    Diabetes Father     ALLERGIES:  has no known allergies.  MEDICATIONS:  Current Outpatient Medications  Medication Sig Dispense Refill   acyclovir (ZOVIRAX) 400 MG tablet Take 1 tablet (400 mg total) by mouth 2 (two) times daily. 60 tablet 3   apixaban  (ELIQUIS ) 5 MG TABS tablet Take 1 tablet (5 mg total) by mouth 2 (two) times daily. 180 tablet 1   Artificial Tear Solution (SOOTHE XP) SOLN Place 1 drop into both eyes 3 (three) times daily as needed (dry eyes).     Ascorbic Acid (VITAMIN C PO) Take 1 tablet by mouth daily.     Fe Bisgly-Vit C-Vit B12-FA (GENTLE IRON  PO) Take 1 tablet by mouth in the morning and at bedtime.     finasteride  (PROSCAR ) 5 MG tablet Take 5 mg by mouth daily.     glimepiride (AMARYL) 4 MG tablet Take 4 mg by mouth in the morning and at bedtime.     hydrochlorothiazide  (HYDRODIURIL ) 25 MG tablet Take 25 mg by mouth daily.     lenalidomide  (REVLIMID ) 15 MG capsule Take 1 capsule (15 mg total) by mouth daily. Take for 14 days, then hold for 7 days. Repeat every 21 days. 14 capsule 0   Misc Natural Products (URINOZINC PLUS PO) Take 1 tablet by mouth 2 (two) times daily.      ondansetron  (ZOFRAN ) 8  MG tablet Take 1 tablet (8 mg total) by mouth every 8 (eight) hours as needed for nausea or vomiting. 30 tablet 1    pioglitazone (ACTOS) 30 MG tablet Take 30 mg by mouth daily.     prochlorperazine  (COMPAZINE ) 10 MG tablet Take 1 tablet (10 mg total) by mouth every 6 (six) hours as needed for nausea or vomiting. 30 tablet 1   telmisartan (MICARDIS) 80 MG tablet Take 80 mg by mouth daily.     No current facility-administered medications for this visit.   Facility-Administered Medications Ordered in Other Visits  Medication Dose Route Frequency Provider Last Rate Last Admin   bortezomib  SQ (VELCADE ) chemo injection (2.5mg /mL concentration) 2.5 mg  1.3 mg/m2 (Treatment Plan Recorded) Subcutaneous Once Jonathon Crass R, MD       dexamethasone  (DECADRON ) tablet 20 mg  20 mg Oral Once Jonathon Whitford R, MD          PHYSICAL EXAMINATION:   Vitals:   05/05/24 0904  BP: 125/60  Pulse: 72  Resp: 16  Temp: (!) 97.5 F (36.4 C)  SpO2: 99%    Filed Weights   05/05/24 0904  Weight: 182 lb 4.8 oz (82.7 kg)     Physical Exam Vitals and nursing note reviewed.  HENT:     Head: Normocephalic and atraumatic.     Mouth/Throat:     Pharynx: Oropharynx is clear.  Eyes:     Extraocular Movements: Extraocular movements intact.     Pupils: Pupils are equal, round, and reactive to light.  Cardiovascular:     Rate and Rhythm: Normal rate and regular rhythm.  Pulmonary:     Comments: Decreased breath sounds bilaterally.  Abdominal:     Palpations: Abdomen is soft.  Musculoskeletal:        General: Normal range of motion.     Cervical back: Normal range of motion.  Skin:    General: Skin is warm.  Neurological:     General: No focal deficit present.     Mental Status: He is alert and oriented to person, place, and time.  Psychiatric:        Behavior: Behavior normal.        Judgment: Judgment normal.     LABORATORY DATA:  I have reviewed the data as listed Lab Results  Component Value Date   WBC 3.4 (L) 05/05/2024   HGB 10.9 (L) 05/05/2024   HCT 33.7 (L) 05/05/2024   MCV 104.3 (H)  05/05/2024   PLT 173 05/05/2024   Recent Labs    09/17/23 0813 12/17/23 0801 03/20/24 0811  NA 134* 137 138  K 3.8 3.6 4.3  CL 103 104 103  CO2 23 26 25   GLUCOSE 146* 85 157*  BUN 26* 26* 20  CREATININE 1.15 1.06 1.07  CALCIUM 9.1 9.3 9.9  GFRNONAA >60 >60 >60  PROT 8.1 7.8 8.4*  ALBUMIN 4.0 3.9 4.0  AST 26 27 22   ALT 25 21 17   ALKPHOS 69 66 111  BILITOT 0.8 0.6 0.7     No results found.    Multiple myeloma in relapse (HCC) # MULTIPLE MYELOMA: ELEVATED M protein:MARCH 2023- IgGK- 1.6 /dl; K/L= 33 [anemia-see above; no hypercalcemia no renal insufficiency; ?  Bone lesions-no x-rays done] OCT  2024- M protein- 1.7; K/L ratio: 57. DEC 12th, Bone marrow Biopsy:  The bone marrow is hypercellular for age with increased number of atypical plasma cells representing 14%;  The background shows trilineage hematopoiesis with generally mild nonspecific  changes.  Features  diagnostic of a myeloid neoplastic process are not present. Cytogenetic: FISH: Gain of 11/ q- standard risk. JAN 2025- PET scan- negative for any bone lesions.   # Patient noted to have progressive anemia-without any other organ dysfunction.  However given the rising M protein/kappa lambda light chain ratio recommend initiating therapy before any significant organ dysfunction occurs.  Understands treatments are palliative not curative.  # Proceed with Revlimid   Velcade  dexamethasone -cytotoxic.- Revlimid  at 15 mg 2 weeks on 1 week off..  However if not tolerating well would cut down the dose to 10 mg.   # Hx of DM- well controlled- recommend checking BG three times a day.   # March 2022 [Dr.Sparks]-small volume PE/ RIGHT LE DVT- Positive for deep venous thrombosis in the right lower extremity [Above Knee- fem]; August 2022-Given recurrent DVT; prior history of PE--currently on indefinite anticoagulation.  No Active Bleeding.- stable  # Intermittent thrombocytopenia isolated  > 100- mild monitor for now-while on Eliquis .  Stable.    # Hx o PVD [s/p stenting]- on eliquis - [Dr.Dew]  # DVT ID prophylaxis-on Eliquis  and acyclovir  Q weekly labs- injection; MD q 3 w  # DISPOSITION: # standing order for q 4 W MM panel; K/L light chains # chemo today-  # follow up as per IS- dr.B  All questions were answered. The patient knows to call the clinic with any problems, questions or concerns.    Jonathon JONELLE Joe, MD 05/05/2024 10:27 AM

## 2024-05-05 NOTE — Assessment & Plan Note (Addendum)
#   MULTIPLE MYELOMA: ELEVATED M protein:MARCH 2023- IgGK- 1.6 /dl; K/L= 33 [anemia-see above; no hypercalcemia no renal insufficiency; ?  Bone lesions-no x-rays done] OCT  2024- M protein- 1.7; K/L ratio: 57. DEC 12th, Bone marrow Biopsy:  The bone marrow is hypercellular for age with increased number of atypical plasma cells representing 14%;  The background shows trilineage hematopoiesis with generally mild nonspecific changes.  Features  diagnostic of a myeloid neoplastic process are not present. Cytogenetic: FISH: Gain of 11/ q- standard risk. JAN 2025- PET scan- negative for any bone lesions.   # Patient noted to have progressive anemia-without any other organ dysfunction.  However given the rising M protein/kappa lambda light chain ratio recommend initiating therapy before any significant organ dysfunction occurs.  Understands treatments are palliative not curative.  # Proceed with Revlimid   Velcade  dexamethasone -cytotoxic.- Revlimid  at 15 mg 2 weeks on 1 week off..  However if not tolerating well would cut down the dose to 10 mg.   # Hx of DM- well controlled- recommend checking BG three times a day.   # March 2022 [Dr.Sparks]-small volume PE/ RIGHT LE DVT- Positive for deep venous thrombosis in the right lower extremity [Above Knee- fem]; August 2022-Given recurrent DVT; prior history of PE--currently on indefinite anticoagulation.  No Active Bleeding.- stable  # Intermittent thrombocytopenia isolated  > 100- mild monitor for now-while on Eliquis . Stable.    # Hx o PVD [s/p stenting]- on eliquis - [Dr.Dew]  # DVT ID prophylaxis-on Eliquis  and acyclovir  Q weekly labs- injection; MD q 3 w  # DISPOSITION: # standing order for q 4 W MM panel; K/L light chains # chemo today-  # follow up as per IS- dr.B

## 2024-05-05 NOTE — Patient Instructions (Signed)
#   Instructed patient to call us  if elevated blood sugars post chemo.

## 2024-05-05 NOTE — Patient Instructions (Addendum)
 [facility] - A DEPT OF [facility]  Discharge Instructions: Thank you for choosing [facility] to provide your oncology and hematology care.  If you have a lab appointment with the Cancer Center, please go directly to the Cancer Center and check in at the registration area.  Wear comfortable clothing and clothing appropriate for easy access to any Portacath or PICC line.   We strive to give you quality time with your provider. You may need to reschedule your appointment if you arrive late (15 or more minutes).  Arriving late affects you and other patients whose appointments are after yours.  Also, if you miss three or more appointments without notifying the office, you may be dismissed from the clinic at the providers discretion.      For prescription refill requests, have your pharmacy contact our office and allow 72 hours for refills to be completed.    Today you received the following chemotherapy and/or immunotherapy agents- velcade       To help prevent nausea and vomiting after your treatment, we encourage you to take your nausea medication as directed.  BELOW ARE SYMPTOMS THAT SHOULD BE REPORTED IMMEDIATELY: *FEVER GREATER THAN 100.4 F (38 C) OR HIGHER *CHILLS OR SWEATING *NAUSEA AND VOMITING THAT IS NOT CONTROLLED WITH YOUR NAUSEA MEDICATION *UNUSUAL SHORTNESS OF BREATH *UNUSUAL BRUISING OR BLEEDING *URINARY PROBLEMS (pain or burning when urinating, or frequent urination) *BOWEL PROBLEMS (unusual diarrhea, constipation, pain near the anus) TENDERNESS IN MOUTH AND THROAT WITH OR WITHOUT PRESENCE OF ULCERS (sore throat, sores in mouth, or a toothache) UNUSUAL RASH, SWELLING OR PAIN  UNUSUAL VAGINAL DISCHARGE OR ITCHING   Items with * indicate a potential emergency and should be followed up as soon as possible or go to the Emergency Department if any problems should occur.  Please show the CHEMOTHERAPY ALERT CARD or IMMUNOTHERAPY  ALERT CARD at check-in to the Emergency Department and triage nurse.  Should you have questions after your visit or need to cancel or reschedule your appointment, please contact [facility] - A DEPT OF [facility]  [phone] and follow the prompts.  Office hours are 8:00 a.m. to 4:30 p.m. Monday - Friday. Please note that voicemails left after 4:00 p.m. may not be returned until the following business day.  We are closed weekends and major holidays. You have access to a nurse at all times for urgent questions. Please call the main number to the clinic [phone] and follow the prompts.  For any non-urgent questions, you may also contact your provider using MyChart. We now offer e-Visits for anyone 25 and older to request care online for non-urgent symptoms. For details visit [email or web address].   Also download the MyChart app! Go to the app store, search MyChart, open the app, select [place], and log in with your MyChart username and password.   Bortezomib  Injection What is this medication? BORTEZOMIB  (bor TEZ oh mib) treats lymphoma. It may also be used to treat multiple myeloma, a type of bone marrow cancer. It works by blocking a protein that causes cancer cells to grow and multiply. This helps to slow or stop the spread of cancer cells. This medicine may be used for other purposes; ask your health care provider or pharmacist if you have questions. COMMON BRAND NAME(S): BORUZU , Velcade  What should I tell my care team before I take this medication? They need to know if you have any  of these conditions: Dehydration Diabetes Heart disease Liver disease Tingling of the fingers or toes or other nerve disorder An unusual or allergic reaction to bortezomib , other medications, foods, dyes, or preservatives If you or your partner are pregnant or trying to get pregnant Breastfeeding How should I use this medication? This medication is injected into  a vein or under the skin. It is given by your care team in a hospital or clinic setting. Talk to your care team about the use of this medication in children. Special care may be needed. Overdosage: If you think you have taken too much of this medicine contact a poison control center or emergency room at once. NOTE: This medicine is only for you. Do not share this medicine with others. What if I miss a dose? Keep appointments for follow-up doses. It is important not to miss your dose. Call your care team if you are unable to keep an appointment. What may interact with this medication? Ketoconazole Rifampin This list may not describe all possible interactions. Give your health care provider a list of all the medicines, herbs, non-prescription drugs, or dietary supplements you use. Also tell them if you smoke, drink alcohol , or use illegal drugs. Some items may interact with your medicine. What should I watch for while using this medication? Your condition will be monitored carefully while you are receiving this medication. You may need blood work while taking this medication. This medication may affect your coordination, reaction time, or judgment. Do not drive or operate machinery until you know how this medication affects you. Sit up or stand slowly to reduce the risk of dizzy or fainting spells. Drinking alcohol  with this medication can increase the risk of these side effects. This medication may increase your risk of getting an infection. Call your care team for advice if you get a fever, chills, sore throat, or other symptoms of a cold or flu. Do not treat yourself. Try to avoid being around people who are sick. Check with your care team if you have severe diarrhea, nausea, and vomiting, or if you sweat a lot. The loss of too much body fluid may make it dangerous for you to take this medication. Talk to your care team if you may be pregnant. Serious birth defects can occur if you take this  medication during pregnancy and for 7 months after the last dose. You will need a negative pregnancy test before starting this medication. Contraception is recommended while taking this medication and for 7 months after the last dose. Your care team can help you find the option that works for you. If your partner can get pregnant, use a condom during sex while taking this medication and for 4 months after the last dose. Do not breastfeed while taking this medication and for 2 months after the last dose. This medication may cause infertility. Talk to your care team if you are concerned about your fertility. What side effects may I notice from receiving this medication? Side effects that you should report to your care team as soon as possible: Allergic reactions--skin rash, itching, hives, swelling of the face, lips, tongue, or throat Bleeding--bloody or black, tar-like stools, vomiting blood or brown material that looks like coffee grounds, red or dark brown urine, small red or purple spots on skin, unusual bruising or bleeding Bleeding in the brain--severe headache, stiff neck, confusion, dizziness, change in vision, numbness or weakness of the face, arm, or leg, trouble speaking, trouble walking, vomiting Bowel blockage--stomach cramping, unable  to have a bowel movement or pass gas, loss of appetite, vomiting Heart failure--shortness of breath, swelling of the ankles, feet, or hands, sudden weight gain, unusual weakness or fatigue Infection--fever, chills, cough, sore throat, wounds that don't heal, pain or trouble when passing urine, general feeling of discomfort or being unwell Liver injury--right upper belly pain, loss of appetite, nausea, light-colored stool, dark yellow or brown urine, yellowing skin or eyes, unusual weakness or fatigue Low blood pressure--dizziness, feeling faint or lightheaded, blurry vision Lung injury--shortness of breath or trouble breathing, cough, spitting up blood, chest  pain, fever Pain, tingling, or numbness in the hands or feet Severe or prolonged diarrhea Stomach pain, bloody diarrhea, pale skin, unusual weakness or fatigue, decrease in the amount of urine, which may be signs of hemolytic uremic syndrome Sudden and severe headache, confusion, change in vision, seizures, which may be signs of posterior reversible encephalopathy syndrome (PRES) TTP--purple spots on the skin or inside the mouth, pale skin, yellowing skin or eyes, unusual weakness or fatigue, fever, fast or irregular heartbeat, confusion, change in vision, trouble speaking, trouble walking Tumor lysis syndrome (TLS)--nausea, vomiting, diarrhea, decrease in the amount of urine, dark urine, unusual weakness or fatigue, confusion, muscle pain or cramps, fast or irregular heartbeat, joint pain Side effects that usually do not require medical attention (report to your care team if they continue or are bothersome): Constipation Diarrhea Fatigue Loss of appetite Nausea This list may not describe all possible side effects. Call your doctor for medical advice about side effects. You may report side effects to FDA at 1-800-FDA-1088. Where should I keep my medication? This medication is given in a hospital or clinic. It will not be stored at home. NOTE: This sheet is a summary. It may not cover all possible information. If you have questions about this medicine, talk to your doctor, pharmacist, or health care provider.  2024 Elsevier/Gold Standard (2021-09-09 00:00:00)

## 2024-05-05 NOTE — Addendum Note (Signed)
 Addended by: LAEL BROWNING A on: 05/05/2024 10:31 AM   Modules accepted: Orders

## 2024-05-08 ENCOUNTER — Telehealth: Payer: Self-pay

## 2024-05-08 ENCOUNTER — Telehealth: Payer: Self-pay | Admitting: *Deleted

## 2024-05-08 LAB — MULTIPLE MYELOMA PANEL, SERUM
Albumin SerPl Elph-Mcnc: 3.6 g/dL (ref 2.9–4.4)
Albumin/Glob SerPl: 0.9 (ref 0.7–1.7)
Alpha 1: 0.2 g/dL (ref 0.0–0.4)
Alpha2 Glob SerPl Elph-Mcnc: 0.9 g/dL (ref 0.4–1.0)
B-Globulin SerPl Elph-Mcnc: 1 g/dL (ref 0.7–1.3)
Gamma Glob SerPl Elph-Mcnc: 2.4 g/dL — ABNORMAL HIGH (ref 0.4–1.8)
Globulin, Total: 4.5 g/dL — ABNORMAL HIGH (ref 2.2–3.9)
IgA: 45 mg/dL — ABNORMAL LOW (ref 61–437)
IgG (Immunoglobin G), Serum: 2805 mg/dL — ABNORMAL HIGH (ref 603–1613)
IgM (Immunoglobulin M), Srm: 109 mg/dL (ref 15–143)
M Protein SerPl Elph-Mcnc: 2.1 g/dL — ABNORMAL HIGH
Total Protein ELP: 8.1 g/dL (ref 6.0–8.5)

## 2024-05-08 LAB — KAPPA/LAMBDA LIGHT CHAINS
Kappa free light chain: 614 mg/L — ABNORMAL HIGH (ref 3.3–19.4)
Kappa, lambda light chain ratio: 109.64 — ABNORMAL HIGH (ref 0.26–1.65)
Lambda free light chains: 5.6 mg/L — ABNORMAL LOW (ref 5.7–26.3)

## 2024-05-08 NOTE — Telephone Encounter (Signed)
 Wife called triage and has a question about a medication. Call returned - Caller verified using pt's full name and dob prior to discussing PHI  Wife inquired about the use of acyclovir and directions. Discussed the Acyclovir is an antiviral drug used to treat infections from herpes viruses, like shingles (herpes zoster), chickenpox. I explained to her that the chemo weakens the immune system, increasing risk & vulnerability for shingles. Wife gave verbal understanding that patient should be on this medication. She thanked me for following up. She stated that she didn't feel educated on this medication and purpose prior.

## 2024-05-08 NOTE — Telephone Encounter (Signed)
 Telephone call to patient for follow up after receiving first injection.   Patient wife states  went great.  States eating good and drinking plenty of fluids.   Denies any nausea or vomiting.  Encouraged patient wife to call for any concerns or questions.

## 2024-05-10 ENCOUNTER — Telehealth: Payer: Self-pay | Admitting: *Deleted

## 2024-05-10 NOTE — Telephone Encounter (Signed)
 Caller verified using pt's full name and dob prior to discussing PHI    Patient/pt wife called for a few concerns:  New onset of pruritus (grade 1)-see RN assessment below. Pt/pt's wife advised on home instructions. Will continue to monitor for now.  Patient reports h/o of actinic keratosis. Dr. Hester recommended topical 5-FU on multiple areas of his hands and extremities. Patient wants Dr. Damaris opinion on whether or not he should be treated with the topical 5-FU at this time. Pt has delayed getting started with the 5-FU initially due to his recent left shoulder. Now, hesitant to proceed with the 5-FU topical given his treatment plan and risk of side effects/interactions.- Please advise.     NURSING SYMPTOM TRIAGE ASSESSMENT & DISPOSITION  Mode of interaction:  Telephone Date of symptom triage interaction:  05/10/24 Time of symptom triage interaction:  1007  Cancer diagnosis: Multiple Myeloma Patient is on Treatment Plan :  MYELOMA NON-TRANSPLANT CANDIDATES VRd weekly q21d  Last treatment date:  05/05/2024  Oral chemotherapy:  Yes, prescription for Revlimid  with last dose taken 05/05/2024.  Symptom Triage Protocol:  Pruritus  Patient reports new onset of pruritus, which is localized on the top his head. Patient is scratching his head. No hair loss/thinning reported. No rash skin rash present  Changes in ADLs: no  Grade: 1-mild/localized  Precipitating factors: none-denies any changes in detergents, soaps, lotions/shampoos, environmental exposures  Onset/Duration: the itching started 3 nights ago  Relieving factors: He has not tried any relief matters  Associated symptoms: dry skin  Note: pt-denies rash/hives/wheals, pustules, fever, dyspnea, chest pain    Provider Consulted  Provider name and credentials: Msg sent to Dr. Rennie and [facility] team  Provider instruction: Ok to begin-Okay to proceed with topical 5-FU.  GB  Continue to monitor Pruritus for now.     Nurse Triage Priority:  Non-urgent (review and reinforce self-care strategies)  Barriers to Care:  None identified  Nurse Triage Disposition:  Home care, return call if no improvement within 24 hours- Pt/pt's wife advised on home instructions. Will continue to monitor for now. Will call if pruritis becomes more extensive and if rash occurs. ED triggers reviewed with patient/wife.  Protocol Source:  Ruthine HERO., & Eldonna CANDIE Pee.). (2019). Telephone triage for oncology nurses (3rd ed.). Oncology Nursing Society.     Patient given the following instructions. Teach back process performed with patient/patient's wife.  Pruritus Pruritus is an itchy feeling on the skin. One of the most common causes is dry skin, but many different things can cause itching. Most cases of itching do not require medical attention. Sometimes itchy skin can turn into a rash or a secondary infection. Follow these instructions at home: Skin care  Do not use scented soaps, detergents, perfumes, and cosmetic products. Instead, use gentle, unscented versions of these items. Apply moisturizing creams to your skin frequently, at least twice daily. Apply immediately after bathing while skin is still wet. Take medicines or apply medicated creams only as told by your health care provider. This may include: Corticosteroid cream or topical calcineurin inhibitor. Anti-itch lotions containing urea , camphor, or menthol. Oral antihistamines. Do not take hot showers or baths, which can make itching worse. A short, cool shower may help with itching as long as you apply moisturizing lotion after the shower. Apply a cool, wet cloth (cool compress) to the affected areas. You may take lukewarm baths with one of the following: Epsom salts. You can get these at your local pharmacy or grocery store.  Follow the instructions on the packaging. Baking soda. Pour a small amount into the bath as told by your health care provider. Colloidal  oatmeal. You can get this at your local pharmacy or grocery store. Follow the instructions on the packaging. Do not scratch your skin. General instructions Avoid wearing tight clothes. Keep a journal to help find out what is causing your itching. Write down: What you eat and drink. What cosmetic products you use. What soaps or detergents you use. What you wear, including jewelry. Use a humidifier. This keeps the air moist, which helps to prevent dry skin. Be aware of any changes in your itchiness. Tell your health care provider about any changes. Contact a health care provider if: The itching does not go away after several days. You notice redness, warmth, or drainage on the skin where you have scratched. You are unusually thirsty or urinating more than normal. Your skin tingles or feels numb. Your skin or the white parts of your eyes turn yellow (jaundice). You feel weak. You have any of the following: Night sweats. Tiredness (fatigue). Weight loss. Abdominal pain. Summary Pruritus is an itchy feeling on the skin. One of the most common causes is dry skin, but many different conditions and factors can cause itching. Apply moisturizing creams to your skin frequently, at least twice daily. Apply immediately after bathing while skin is still wet. Take medicines or apply medicated creams only as told by your health care provider. Do not take hot showers or baths. Do not use scented soaps, detergents, perfumes, or cosmetic products. Keep a journal to help find out what is causing your itching.

## 2024-05-11 ENCOUNTER — Encounter: Payer: Self-pay | Admitting: Internal Medicine

## 2024-05-11 NOTE — Telephone Encounter (Signed)
 Attempted to reach wife back with recommendations left brief msg on wife's vm- ok for patient to use the 5fu cream on skin lesions as recommended by dermatology. Also RN inquired if patient's symptoms of pruritis had improved.

## 2024-05-12 ENCOUNTER — Inpatient Hospital Stay

## 2024-05-12 ENCOUNTER — Other Ambulatory Visit: Payer: Self-pay | Admitting: *Deleted

## 2024-05-12 VITALS — BP 118/62 | HR 66 | Temp 97.7°F | Resp 16 | Wt 182.5 lb

## 2024-05-12 DIAGNOSIS — C9002 Multiple myeloma in relapse: Secondary | ICD-10-CM

## 2024-05-12 DIAGNOSIS — C9000 Multiple myeloma not having achieved remission: Secondary | ICD-10-CM

## 2024-05-12 LAB — CMP (CANCER CENTER ONLY)
ALT: 19 U/L (ref 0–44)
AST: 20 U/L (ref 15–41)
Albumin: 4.1 g/dL (ref 3.5–5.0)
Alkaline Phosphatase: 81 U/L (ref 38–126)
Anion gap: 8 (ref 5–15)
BUN: 27 mg/dL — ABNORMAL HIGH (ref 8–23)
CO2: 26 mmol/L (ref 22–32)
Calcium: 10.2 mg/dL (ref 8.9–10.3)
Chloride: 103 mmol/L (ref 98–111)
Creatinine: 1.11 mg/dL (ref 0.61–1.24)
GFR, Estimated: >60 mL/min
Glucose, Bld: 134 mg/dL — ABNORMAL HIGH (ref 70–99)
Potassium: 4.4 mmol/L (ref 3.5–5.1)
Sodium: 137 mmol/L (ref 135–145)
Total Bilirubin: 0.6 mg/dL (ref 0.0–1.2)
Total Protein: 7.9 g/dL (ref 6.5–8.1)

## 2024-05-12 LAB — CBC WITH DIFFERENTIAL (CANCER CENTER ONLY)
Abs Immature Granulocytes: 0.07 K/uL (ref 0.00–0.07)
Basophils Absolute: 0.1 K/uL (ref 0.0–0.1)
Basophils Relative: 1 %
Eosinophils Absolute: 0.3 K/uL (ref 0.0–0.5)
Eosinophils Relative: 7 %
HCT: 30.7 % — ABNORMAL LOW (ref 39.0–52.0)
Hemoglobin: 10.1 g/dL — ABNORMAL LOW (ref 13.0–17.0)
Immature Granulocytes: 2 %
Lymphocytes Relative: 22 %
Lymphs Abs: 0.9 K/uL (ref 0.7–4.0)
MCH: 34.5 pg — ABNORMAL HIGH (ref 26.0–34.0)
MCHC: 32.9 g/dL (ref 30.0–36.0)
MCV: 104.8 fL — ABNORMAL HIGH (ref 80.0–100.0)
Monocytes Absolute: 0.4 K/uL (ref 0.1–1.0)
Monocytes Relative: 10 %
Neutro Abs: 2.5 K/uL (ref 1.7–7.7)
Neutrophils Relative %: 58 %
Platelet Count: 115 K/uL — ABNORMAL LOW (ref 150–400)
RBC: 2.93 MIL/uL — ABNORMAL LOW (ref 4.22–5.81)
RDW: 14.7 % (ref 11.5–15.5)
WBC Count: 4.3 K/uL (ref 4.0–10.5)
nRBC: 0 % (ref 0.0–0.2)

## 2024-05-12 MED ORDER — LENALIDOMIDE 15 MG PO CAPS: 15.0000 mg | ORAL_CAPSULE | Freq: Every day | ORAL | 0 refills | Status: AC

## 2024-05-12 MED ORDER — DEXAMETHASONE 4 MG PO TABS
20.0000 mg | ORAL_TABLET | Freq: Once | ORAL | Status: AC
  Administered 2024-05-12: 20 mg via ORAL
  Filled 2024-05-12: qty 5

## 2024-05-12 MED ORDER — BORTEZOMIB CHEMO SQ INJECTION 3.5 MG (2.5MG/ML)
1.3000 mg/m2 | Freq: Once | INTRAMUSCULAR | Status: AC
  Administered 2024-05-12: 2.5 mg via SUBCUTANEOUS
  Filled 2024-05-12: qty 1

## 2024-05-12 NOTE — Patient Instructions (Signed)
 [facility] - A DEPT OF [place]  Discharge Instructions: Thank you for choosing [facility] to provide your oncology and hematology care.  If you have a lab appointment with the Cancer Center, please go directly to the Cancer Center and check in at the registration area.  Wear comfortable clothing and clothing appropriate for easy access to any Portacath or PICC line.   We strive to give you quality time with your provider. You may need to reschedule your appointment if you arrive late (15 or more minutes).  Arriving late affects you and other patients whose appointments are after yours.  Also, if you miss three or more appointments without notifying the office, you may be dismissed from the clinic at the provider's discretion.      For prescription refill requests, have your pharmacy contact our office and allow 72 hours for refills to be completed.    Today you received the following chemotherapy and/or immunotherapy agents Velcade       To help prevent nausea and vomiting after your treatment, we encourage you to take your nausea medication as directed.  BELOW ARE SYMPTOMS THAT SHOULD BE REPORTED IMMEDIATELY: *FEVER GREATER THAN 100.4 F (38 C) OR HIGHER *CHILLS OR SWEATING *NAUSEA AND VOMITING THAT IS NOT CONTROLLED WITH YOUR NAUSEA MEDICATION *UNUSUAL SHORTNESS OF BREATH *UNUSUAL BRUISING OR BLEEDING *URINARY PROBLEMS (pain or burning when urinating, or frequent urination) *BOWEL PROBLEMS (unusual diarrhea, constipation, pain near the anus) TENDERNESS IN MOUTH AND THROAT WITH OR WITHOUT PRESENCE OF ULCERS (sore throat, sores in mouth, or a toothache) UNUSUAL RASH, SWELLING OR PAIN  UNUSUAL VAGINAL DISCHARGE OR ITCHING   Items with * indicate a potential emergency and should be followed up as soon as possible or go to the Emergency Department if any problems should occur.  Please show the CHEMOTHERAPY ALERT CARD or IMMUNOTHERAPY  ALERT CARD at check-in to the Emergency Department and triage nurse.  Should you have questions after your visit or need to cancel or reschedule your appointment, please contact [facility] - A DEPT OF [facility]  [phone] and follow the prompts.  Office hours are 8:00 a.m. to 4:30 p.m. Monday - Friday. Please note that voicemails left after 4:00 p.m. may not be returned until the following business day.  We are closed weekends and major holidays. You have access to a nurse at all times for urgent questions. Please call the main number to the clinic [phone] and follow the prompts.  For any non-urgent questions, you may also contact your provider using MyChart. We now offer e-Visits for anyone 63 and older to request care online for non-urgent symptoms. For details visit [email or web address].   Also download the MyChart app! Go to the app store, search MyChart, open the app, select [place], and log in with your MyChart username and password.

## 2024-05-19 ENCOUNTER — Inpatient Hospital Stay: Admitting: Internal Medicine

## 2024-05-19 ENCOUNTER — Encounter: Payer: Self-pay | Admitting: Internal Medicine

## 2024-05-19 ENCOUNTER — Inpatient Hospital Stay

## 2024-05-19 VITALS — BP 118/62 | HR 73 | Temp 97.6°F | Resp 16 | Ht 69.0 in | Wt 182.6 lb

## 2024-05-19 DIAGNOSIS — C9002 Multiple myeloma in relapse: Secondary | ICD-10-CM

## 2024-05-19 LAB — CBC WITH DIFFERENTIAL (CANCER CENTER ONLY)
Abs Immature Granulocytes: 0.04 10*3/uL (ref 0.00–0.07)
Basophils Absolute: 0.1 10*3/uL (ref 0.0–0.1)
Basophils Relative: 1 %
Eosinophils Absolute: 0.3 10*3/uL (ref 0.0–0.5)
Eosinophils Relative: 4 %
HCT: 29.7 % — ABNORMAL LOW (ref 39.0–52.0)
Hemoglobin: 9.8 g/dL — ABNORMAL LOW (ref 13.0–17.0)
Immature Granulocytes: 1 %
Lymphocytes Relative: 14 %
Lymphs Abs: 0.9 10*3/uL (ref 0.7–4.0)
MCH: 34.1 pg — ABNORMAL HIGH (ref 26.0–34.0)
MCHC: 33 g/dL (ref 30.0–36.0)
MCV: 103.5 fL — ABNORMAL HIGH (ref 80.0–100.0)
Monocytes Absolute: 0.9 10*3/uL (ref 0.1–1.0)
Monocytes Relative: 13 %
Neutro Abs: 4.5 10*3/uL (ref 1.7–7.7)
Neutrophils Relative %: 67 %
Platelet Count: 131 10*3/uL — ABNORMAL LOW (ref 150–400)
RBC: 2.87 MIL/uL — ABNORMAL LOW (ref 4.22–5.81)
RDW: 14.6 % (ref 11.5–15.5)
WBC Count: 6.7 10*3/uL (ref 4.0–10.5)
nRBC: 0 % (ref 0.0–0.2)

## 2024-05-19 LAB — CMP (CANCER CENTER ONLY)
ALT: 16 U/L (ref 0–44)
AST: 19 U/L (ref 15–41)
Albumin: 4 g/dL (ref 3.5–5.0)
Alkaline Phosphatase: 82 U/L (ref 38–126)
Anion gap: 10 (ref 5–15)
BUN: 27 mg/dL — ABNORMAL HIGH (ref 8–23)
CO2: 24 mmol/L (ref 22–32)
Calcium: 10 mg/dL (ref 8.9–10.3)
Chloride: 102 mmol/L (ref 98–111)
Creatinine: 1.02 mg/dL (ref 0.61–1.24)
GFR, Estimated: >60 mL/min
Glucose, Bld: 147 mg/dL — ABNORMAL HIGH (ref 70–99)
Potassium: 4.2 mmol/L (ref 3.5–5.1)
Sodium: 137 mmol/L (ref 135–145)
Total Bilirubin: 0.4 mg/dL (ref 0.0–1.2)
Total Protein: 7.4 g/dL (ref 6.5–8.1)

## 2024-05-19 MED ORDER — PIOGLITAZONE HCL 45 MG PO TABS: 45.0000 mg | ORAL_TABLET | Freq: Every day | ORAL | 3 refills | Status: AC

## 2024-05-19 MED ORDER — DEXAMETHASONE 4 MG PO TABS
20.0000 mg | ORAL_TABLET | Freq: Once | ORAL | Status: AC
  Administered 2024-05-19: 20 mg via ORAL
  Filled 2024-05-19: qty 5

## 2024-05-19 MED ORDER — BORTEZOMIB CHEMO SQ INJECTION 3.5 MG (2.5MG/ML)
1.3000 mg/m2 | Freq: Once | INTRAMUSCULAR | Status: AC
  Administered 2024-05-19: 2.5 mg via SUBCUTANEOUS
  Filled 2024-05-19: qty 1

## 2024-05-19 NOTE — Assessment & Plan Note (Addendum)
#   MULTIPLE MYELOMA: ELEVATED M protein:MARCH 2023- IgGK- 1.6 /dl; K/L= 33 [anemia-see above; no hypercalcemia no renal insufficiency; ?  Bone lesions-no x-rays done] OCT  2024- M protein- 1.7; K/L ratio: 57. DEC 12th, Bone marrow Biopsy:  The bone marrow is hypercellular for age with increased number of atypical plasma cells representing 14%;  The background shows trilineage hematopoiesis with generally mild nonspecific changes.  Features  diagnostic of a myeloid neoplastic process are not present. Cytogenetic: FISH: Gain of 11/ q- standard risk. JAN 2025- PET scan- negative for any bone lesions.   # Proceed with Revlimid   Velcade  dexamethasone - Revlimid  at 15 mg 2 weeks on 1 week off..  However if not tolerating well would cut down the dose to 10 mg. Labs-CBC/chemistries were reviewed with the patient.  # # Intermittent thrombocytopenia isolated  > 100- mild monitor for now-while on Eliquis . Stable.   # Forearm-rash/squamous of carcinoma [as per dermatology]-okay to use topical 5-FU.  # Hx of DM- well controlled-continue Amaryl 4 mg; increase the dose od actos  to 45 mg/day- continue checking BG fasting-    # March 2022 [Dr.Sparks]-small volume PE/ RIGHT LE DVT- Positive for deep venous thrombosis in the right lower extremity [Above Knee- fem]; August 2022-Given recurrent DVT; prior history of PE--currently on indefinite anticoagulation.  No Active Bleeding.- stable  # Hx o PVD [s/p stenting]- on eliquis - [Dr.Dew]  # DVT ID prophylaxis-on Eliquis  and acyclovir  Q weekly labs- injection; MD q 3 w  # DISPOSITION: # standing order for q 4 W MM panel; K/L light chains # chemo today-  # follow up as per IS- dr.B

## 2024-05-19 NOTE — Progress Notes (Signed)
 Fell yesterday on ice, no injuries.  Pt states his scalp has been itching but could be from using wife's shampoo.  Pt would like you to look at his last injection site, lt abdomen, red.

## 2024-05-25 ENCOUNTER — Inpatient Hospital Stay

## 2024-05-25 ENCOUNTER — Inpatient Hospital Stay: Admitting: Internal Medicine

## 2024-05-26 ENCOUNTER — Inpatient Hospital Stay: Attending: Internal Medicine

## 2024-05-26 ENCOUNTER — Inpatient Hospital Stay

## 2024-05-26 ENCOUNTER — Encounter: Payer: Self-pay | Admitting: Internal Medicine

## 2024-05-26 ENCOUNTER — Inpatient Hospital Stay: Admitting: Internal Medicine

## 2024-05-26 VITALS — BP 139/58 | HR 66 | Temp 96.7°F | Resp 16 | Ht 69.0 in | Wt 185.1 lb

## 2024-05-26 VITALS — BP 143/61 | HR 67 | Resp 16

## 2024-05-26 DIAGNOSIS — C9002 Multiple myeloma in relapse: Secondary | ICD-10-CM

## 2024-05-26 LAB — CMP (CANCER CENTER ONLY)
ALT: 16 U/L (ref 0–44)
AST: 17 U/L (ref 15–41)
Albumin: 4.1 g/dL (ref 3.5–5.0)
Alkaline Phosphatase: 99 U/L (ref 38–126)
Anion gap: 11 (ref 5–15)
BUN: 22 mg/dL (ref 8–23)
CO2: 24 mmol/L (ref 22–32)
Calcium: 9.5 mg/dL (ref 8.9–10.3)
Chloride: 104 mmol/L (ref 98–111)
Creatinine: 0.99 mg/dL (ref 0.61–1.24)
GFR, Estimated: >60 mL/min
Glucose, Bld: 149 mg/dL — ABNORMAL HIGH (ref 70–99)
Potassium: 3.9 mmol/L (ref 3.5–5.1)
Sodium: 139 mmol/L (ref 135–145)
Total Bilirubin: 0.6 mg/dL (ref 0.0–1.2)
Total Protein: 7.6 g/dL (ref 6.5–8.1)

## 2024-05-26 LAB — CBC WITH DIFFERENTIAL (CANCER CENTER ONLY)
Abs Immature Granulocytes: 0.02 10*3/uL (ref 0.00–0.07)
Basophils Absolute: 0.1 10*3/uL (ref 0.0–0.1)
Basophils Relative: 2 %
Eosinophils Absolute: 0.2 10*3/uL (ref 0.0–0.5)
Eosinophils Relative: 4 %
HCT: 30.8 % — ABNORMAL LOW (ref 39.0–52.0)
Hemoglobin: 10 g/dL — ABNORMAL LOW (ref 13.0–17.0)
Immature Granulocytes: 1 %
Lymphocytes Relative: 19 %
Lymphs Abs: 0.8 10*3/uL (ref 0.7–4.0)
MCH: 33.9 pg (ref 26.0–34.0)
MCHC: 32.5 g/dL (ref 30.0–36.0)
MCV: 104.4 fL — ABNORMAL HIGH (ref 80.0–100.0)
Monocytes Absolute: 0.5 10*3/uL (ref 0.1–1.0)
Monocytes Relative: 13 %
Neutro Abs: 2.6 10*3/uL (ref 1.7–7.7)
Neutrophils Relative %: 61 %
Platelet Count: 124 10*3/uL — ABNORMAL LOW (ref 150–400)
RBC: 2.95 MIL/uL — ABNORMAL LOW (ref 4.22–5.81)
RDW: 14.4 % (ref 11.5–15.5)
WBC Count: 4.1 10*3/uL (ref 4.0–10.5)
nRBC: 0 % (ref 0.0–0.2)

## 2024-05-26 MED ORDER — BORTEZOMIB CHEMO SQ INJECTION 3.5 MG (2.5MG/ML)
1.3000 mg/m2 | Freq: Once | INTRAMUSCULAR | Status: AC
  Administered 2024-05-26: 2.5 mg via SUBCUTANEOUS
  Filled 2024-05-26: qty 1

## 2024-05-26 MED ORDER — DEXAMETHASONE 4 MG PO TABS
20.0000 mg | ORAL_TABLET | Freq: Once | ORAL | Status: AC
  Administered 2024-05-26: 20 mg via ORAL
  Filled 2024-05-26: qty 5

## 2024-05-26 NOTE — Progress Notes (Signed)
 [name]  Patient Care Team: Jonathon Reyes BIRCH, MD as PCP - General (Internal Medicine) Jonathon Cindy SAUNDERS, MD as Consulting Physician (Oncology)  CHIEF COMPLAINTS/PURPOSE OF CONSULTATION: MULTIPLE MYELOMA  HEMATOLOGY HISTORY:  Oncology History Overview Note  # ANEMIA NORMOCYTIC  [JAN 2023- PCP-hemoglobin 8.7; saturation 7%; ferritin 23-LL-28]EGD/ colonoscopy-JAN 2023 [[facility]]; NO CT-A/P or capsule.   # MARCH 2023-possible MGUS; IgG kappa 1.6 g; kappa/lambda light chain ratio 33.  # MULTIPLE MYELOMA: ELEVATED M protein:MARCH 2023- IgGK- 1.6 /dl; K/L= 33 [anemia-see above; no hypercalcemia no renal insufficiency; ?  Bone lesions-no x-rays done] OCT  2024- M protein- 1.7; K/L ratio: 57. DEC 12th, Bone marrow Biopsy:  The bone marrow is hypercellular for age with increased number of atypical plasma cells representing 14%;  The background shows trilineage hematopoiesis with generally mild nonspecific changes.  Features  diagnostic of a myeloid neoplastic process are not present. Cytogenetic: FISH: Gain of 11/ q- standard risk. JAN 2025- PET scan- negative for any bone lesions.   # JAN 16th, 2026- R 15mg  - 2w/1;V elcade Dex 07 July 2020-PE small volume right lower lobe; and right lower extremity above-knee DVT.  On Eliquis ; OFF in March 2023; JUNE 15th-DVT-femoral/popliteal nonocclusive-started on Eliquis - INDEFINITE   # Hx of PVD [Dr.Dew]   Multiple myeloma in relapse (HCC)  03/30/2024 Initial Diagnosis   Multiple myeloma in relapse (HCC)   03/30/2024 Cancer Staging   Staging form: Plasma Cell Myeloma and Plasma Cell Disorders, AJCC 8th Edition - Clinical: RISS Stage I (Beta-2 -microglobulin (mg/L): 2.2, Albumin (g/dL): 4, ISS: Stage I, High-risk cytogenetics: Absent, LDH: Normal) - Signed by Jonathon Cindy SAUNDERS, MD on 03/30/2024 Beta 2 microglobulin range (mg/L): Less than 3.5 Albumin range (g/dL): Greater than or equal to 3.5 Cytogenetics: Trisomy  11   05/05/2024 -  Chemotherapy   Patient is on Treatment Plan : MYELOMA NON-TRANSPLANT CANDIDATES VRd weekly q21d         Latest Reference Range & Units Most Recent 01/29/22 07:55 08/11/22 07:55 02/10/23 08:18  IgG (Immunoglobin G), Serum 603 - 1,613 mg/dL 7,879 (H) 89/76/75 91:81 1,975 (H) 2,058 (H) 2,120 (H)  IgM (Immunoglobulin M), Srm 15 - 143 mg/dL 865 89/76/75 91:81 836 (H) 156 (H) 134  IgA 61 - 437 mg/dL 66 89/76/75 91:81 75 81 66 she Not Please so Jonathon Barrera the Numbers Only (Ongoing Right  Kappa free light chain 3.3 - 19.4 mg/L 322.9 (H) 02/10/23 08:18 293.7 (H) 239.9 (H) 322.9 (H)  Lambda free light chains 5.7 - 26.3 mg/L 5.6 (L) 02/10/23 08:18 8.9 9.2 5.6 (L)  Kappa, lambda light chain ratio 0.26 - 1.65  57.66 (H) 02/10/23 08:18 33.00 (H) 26.08 (H) 57.66 (H)  (H): Data is abnormally high (L): Data is abnormally low    HISTORY OF PRESENTING ILLNESS: Patient accompanied by his wife.  Ambulating independently.  Slightly hard of hearing.  Jonathon Barrera 84 y.o.  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   Discussed the use of AI scribe software for clinical note transcription with the patient, who gave verbal consent to proceed.  History of Present Illness   Jonathon Barrera is an 84 year old male with relapsed multiple myeloma undergoing chemotherapy who presents for routine follow-up to assess treatment tolerance and disease status.  He is currently receiving chemotherapy for relapsed multiple myeloma. Over the past  few days, he has developed dysgeusia, describing diminished taste for coffee and regular foods, though sweet foods remain palatable. He has been using plastic utensils without improvement and is considering returning to silverware. No diarrhea is present.  He denies recent falls. There is no paresthesia in  the hands or feet, except for mild numbness in the thumb, which he attributes to his shoulder and notes is stable. He reports cold hands but no other new neurological or musculoskeletal symptoms.  He has type 2 diabetes mellitus. Home blood glucose readings have been elevated, particularly the day after chemotherapy, reaching up to 300 mg/dL, then decreasing to 839-809 mg/dL, with a most recent reading of 124 mg/dL. He did not bring his glucose log to the visit.      Review of Systems  Constitutional:  Positive for malaise/fatigue. Negative for chills, diaphoresis, fever and weight loss.  HENT:  Negative for nosebleeds and sore throat.   Eyes:  Negative for double vision.  Respiratory:  Negative for cough, hemoptysis, sputum production, shortness of breath and wheezing.   Cardiovascular:  Negative for chest pain, palpitations, orthopnea and leg swelling.  Gastrointestinal:  Negative for abdominal pain, blood in stool, constipation, diarrhea, heartburn, melena, nausea and vomiting.  Genitourinary:  Negative for dysuria, frequency and urgency.  Musculoskeletal:  Positive for joint pain. Negative for back pain.  Skin: Negative.  Negative for itching and rash.  Neurological:  Negative for dizziness, tingling, focal weakness, weakness and headaches.  Endo/Heme/Allergies:  Does not bruise/bleed easily.  Psychiatric/Behavioral:  Negative for depression. The patient is not nervous/anxious and does not have insomnia.     MEDICAL HISTORY:  Past Medical History:  Diagnosis Date   Actinic keratosis    Acute deep vein thrombosis (DVT) of distal vein of right lower extremity (HCC) 06/19/2020   Aortic atherosclerosis    Bladder carcinoma (HCC) 05/26/2007   a.) papillary-transitional cell   BPH (benign prostatic hyperplasia)    Colon polyps    COPD (chronic obstructive pulmonary disease) (HCC)    Current use of long term anticoagulation    a.) Apixaban    Diverticulosis    DOE (dyspnea on exertion)     History of SCC (squamous cell carcinoma) of skin 01/06/2023   left deltoid  ED&C done 01/06/2023   HOH (hard of hearing)    a.) uses BILATERAL assistive devices   Hyperlipemia    Hypertension    Mass    Lt lower cheek   Melanoma (HCC) 2000   Tx by Dr Charlott   PAD (peripheral artery disease)    Peripheral vascular disease    Pulmonary embolism (HCC) 06/19/2020   a.) RLL; no associated RIGHT heart strain   RBBB (right bundle branch block)    Squamous cell carcinoma in situ (SCCIS) 06/08/2023   left infra axillary txt with ED&C   Squamous cell carcinoma of skin 02/01/2017   Left dorsum latera. hand. EDC   Squamous cell carcinoma of skin 02/01/2017   Left dorsum base of thumb. EDC   T2DM (type 2 diabetes mellitus) (HCC)     SURGICAL HISTORY: Past Surgical History:  Procedure Laterality Date   back skin cancer     BLADDER SURGERY     CARDIAC CATHETERIZATION     CATARACT EXTRACTION W/PHACO Right 04/30/2015   Procedure: CATARACT EXTRACTION PHACO AND INTRAOCULAR LENS PLACEMENT (IOC);  Surgeon: Elsie Carmine, MD;  Location: [facility] ORS;  Service: Ophthalmology;  Laterality: Right;  US  01:03 AP% 21.4 CDE  13.57 fluid pack lot #  [id number]   CATARACT EXTRACTION W/PHACO Left 05/21/2015   Procedure: CATARACT EXTRACTION PHACO AND INTRAOCULAR LENS PLACEMENT (IOC);  Surgeon: Elsie Carmine, MD;  Location: [facility] ORS;  Service: Ophthalmology;  Laterality: Left;  US  00:52 AP% 18.9 CDE 9.95 fluid pack lot #[id number]   COLONOSCOPY WITH PROPOFOL  N/A 11/05/2014   Procedure: COLONOSCOPY WITH PROPOFOL ;  Surgeon: Deward CINDERELLA Piedmont, MD;  Location: [facility] ENDOSCOPY;  Service: Gastroenterology;  Laterality: N/A;   COLONOSCOPY WITH PROPOFOL  N/A 05/19/2021   Procedure: COLONOSCOPY WITH PROPOFOL ;  Surgeon: Onita Elspeth Sharper, DO;  Location: [facility] ENDOSCOPY;  Service: Gastroenterology;  Laterality: N/A;  DM Patient is hard of hearing and would like for wife to answer questions, please.   ENDARTERECTOMY FEMORAL  Right 02/26/2021   Procedure: ENDARTERECTOMY FEMORAL (SFA STENT PLACEMEN);  Surgeon: Marea Selinda RAMAN, MD;  Location: [facility] ORS;  Service: Vascular;  Laterality: Right;   ESOPHAGOGASTRODUODENOSCOPY (EGD) WITH PROPOFOL  N/A 05/19/2021   Procedure: ESOPHAGOGASTRODUODENOSCOPY (EGD) WITH PROPOFOL ;  Surgeon: Onita Elspeth Sharper, DO;  Location[facility] ENDOSCOPY;  Service: Gastroenterology;  Laterality: N/A;   HERNIA REPAIR     inguinal   IR BONE MARROW BIOPSY & ASPIRATION  03/23/2023   KYPHOPLASTY N/A 02/23/2019   Procedure: T12 KYPHOPLASTY;  Surgeon: Kathlynn Sharper, MD;  Location: [facility] ORS;  Service: Orthopedics;  Laterality: N/A;   LESION EXCISION N/A 02/23/2018   Procedure: EXCISION SCALP CYST AND FACIAL CYST;  Surgeon: Rodolph Romano, MD;  Location: [facility] ORS;  Service: General;  Laterality: N/A;   LOWER EXTREMITY ANGIOGRAPHY Right 01/23/2021   Procedure: LOWER EXTREMITY ANGIOGRAPHY;  Surgeon: Marea Selinda RAMAN, MD;  Location: [facility] INVASIVE CV LAB;  Service: Cardiovascular;  Laterality: Right;   LOWER EXTREMITY ANGIOGRAPHY Right 01/12/2022   Procedure: Lower Extremity Angiography;  Surgeon: Marea Selinda RAMAN, MD;  Location: [facility] INVASIVE CV LAB;  Service: Cardiovascular;  Laterality: Right;   LOWER EXTREMITY ANGIOGRAPHY Right 01/26/2022   Procedure: Lower Extremity Angiography;  Surgeon: Marea Selinda RAMAN, MD;  Location: [facility] INVASIVE CV LAB;  Service: Cardiovascular;  Laterality: Right;   SHOULDER ARTHROSCOPY W/ ROTATOR CUFF REPAIR Right    TUR-BT      SOCIAL HISTORY: Social History   Socioeconomic History   Marital status: Married    Spouse name: Not on file   Number of children: Not on file   Years of education: Not on file   Highest education level: Not on file  Occupational History   Not on file  Tobacco Use   Smoking status: Every Day    Current packs/day: 0.50    Types: Cigarettes   Smokeless tobacco: Never  Vaping Use   Vaping status: Never Used  Substance and Sexual Activity   Alcohol  use: Yes     Alcohol /week: 6.0 standard drinks of alcohol     Types: 6 Cans of beer per week   Drug use: No   Sexual activity: Not on file  Other Topics Concern   Not on file  Social History Narrative   Lives in [place]; with wife. 2 children/near by. Smokes 1/2 ppd; 3-4 beers/weekend. Retired in 2010; patent examiner.    Social Drivers of Health   Tobacco Use: High Risk (05/26/2024)   Patient History    Smoking Tobacco Use: Every Day    Smokeless Tobacco Use: Never    Passive Exposure: Not on file  Financial Resource Strain: Low Risk  (03/03/2024)   Received from [facility]   Overall Financial Resource Strain (CARDIA)    Difficulty of  Paying Living Expenses: Not hard at all  Food Insecurity: No Food Insecurity (03/03/2024)   Received from [facility]   Epic    Within the past 12 months, you worried that your food would run out before you got the money to buy more.: Never true    Within the past 12 months, the food you bought just didn't last and you didn't have money to get more.: Never true  Transportation Needs: No Transportation Needs (03/03/2024)   Received from [facility] - Transportation    In the past 12 months, has lack of transportation kept you from medical appointments or from getting medications?: No    Lack of Transportation (Non-Medical): No  Physical Activity: Not on file  Stress: Not on file  Social Connections: Not on file  Intimate Partner Violence: Not on file  Depression (PHQ2-9): Low Risk (05/26/2024)   Depression (PHQ2-9)    PHQ-2 Score: 0  Alcohol  Screen: Not on file  Housing: Low Risk  (03/03/2024)   Received from [facility]   Epic    In the last 12 months, was there a time when you were not able to pay the mortgage or rent on time?: No    In the past 12 months, how many times have you moved where you were living?: 0    At any time in the past 12 months, were  you homeless or living in a shelter (including now)?: No  Utilities: Not At Risk (03/03/2024)   Received from [facility]   Epic    In the past 12 months has the electric, gas, oil, or water company threatened to shut off services in your home?: No  Health Literacy: Not on file    FAMILY HISTORY: Family History  Problem Relation Age of Onset   Diabetes Mother    Colon cancer Mother    Diabetes Father     ALLERGIES:  has no known allergies.  MEDICATIONS:  Current Outpatient Medications  Medication Sig Dispense Refill   acyclovir (ZOVIRAX) 400 MG tablet Take 1 tablet (400 mg total) by mouth 2 (two) times daily. 60 tablet 3   apixaban  (ELIQUIS ) 5 MG TABS tablet Take 1 tablet (5 mg total) by mouth 2 (two) times daily. 180 tablet 1   Artificial Tear Solution (SOOTHE XP) SOLN Place 1 drop into both eyes 3 (three) times daily as needed (dry eyes).     Ascorbic Acid (VITAMIN C PO) Take 1 tablet by mouth daily.     Fe Bisgly-Vit C-Vit B12-FA (GENTLE IRON  PO) Take 1 tablet by mouth in the morning and at bedtime.     finasteride  (PROSCAR ) 5 MG tablet Take 5 mg by mouth daily.     glimepiride (AMARYL) 4 MG tablet Take 4 mg by mouth in the morning and at bedtime.     hydrochlorothiazide  (HYDRODIURIL ) 25 MG tablet Take 25 mg by mouth daily.     lenalidomide  (REVLIMID ) 15 MG capsule Take 1 capsule (15 mg total) by mouth daily. Take for 14 days, then hold for 7 days. Repeat every 21 days. 14 capsule 0   Misc Natural Products (URINOZINC PLUS PO) Take 1 tablet by mouth 2 (two) times daily.      ondansetron  (ZOFRAN ) 8 MG tablet Take 1 tablet (8 mg total) by mouth every 8 (eight) hours as needed for nausea or vomiting. 30 tablet 1   prochlorperazine  (COMPAZINE ) 10 MG tablet Take 1 tablet (  10 mg total) by mouth every 6 (six) hours as needed for nausea or vomiting. 30 tablet 1   telmisartan (MICARDIS) 80 MG tablet Take 80 mg by mouth daily.     pioglitazone  (ACTOS ) 45 MG tablet Take 1  tablet (45 mg total) by mouth daily. 30 tablet 3   No current facility-administered medications for this visit.      PHYSICAL EXAMINATION:   Vitals:   05/26/24 0825  BP: (!) 139/58  Pulse: 66  Resp: 16  Temp: (!) 96.7 F (35.9 C)  SpO2: 100%    Filed Weights   05/26/24 0825  Weight: 185 lb 1.6 oz (84 kg)     Physical Exam Vitals and nursing note reviewed.  HENT:     Head: Normocephalic and atraumatic.     Mouth/Throat:     Pharynx: Oropharynx is clear.  Eyes:     Extraocular Movements: Extraocular movements intact.     Pupils: Pupils are equal, round, and reactive to light.  Cardiovascular:     Rate and Rhythm: Normal rate and regular rhythm.  Pulmonary:     Comments: Decreased breath sounds bilaterally.  Abdominal:     Palpations: Abdomen is soft.  Musculoskeletal:        General: Normal range of motion.     Cervical back: Normal range of motion.  Skin:    General: Skin is warm.  Neurological:     General: No focal deficit present.     Mental Status: He is alert and oriented to person, place, and time.  Psychiatric:        Behavior: Behavior normal.        Judgment: Judgment normal.     LABORATORY DATA:  I have reviewed the data as listed Lab Results  Component Value Date   WBC 4.1 05/26/2024   HGB 10.0 (L) 05/26/2024   HCT 30.8 (L) 05/26/2024   MCV 104.4 (H) 05/26/2024   PLT 124 (L) 05/26/2024   Recent Labs    05/12/24 0905 05/19/24 0856 05/26/24 0833  NA 137 137 139  K 4.4 4.2 3.9  CL 103 102 104  CO2 26 24 24   GLUCOSE 134* 147* 149*  BUN 27* 27* 22  CREATININE 1.11 1.02 0.99  CALCIUM 10.2 10.0 9.5  GFRNONAA >60 >60 >60  PROT 7.9 7.4 7.6  ALBUMIN 4.1 4.0 4.1  AST 20 19 17   ALT 19 16 16   ALKPHOS 81 82 99  BILITOT 0.6 0.4 0.6     No results found.    Multiple myeloma in relapse (HCC) # MULTIPLE MYELOMA: ELEVATED M protein:MARCH 2023- IgGK- 1.6 /dl; K/L= 33 [anemia-see above; no hypercalcemia no renal insufficiency; ?  Bone  lesions-no x-rays done] OCT  2024- M protein- 1.7; K/L ratio: 57. DEC 12th, Bone marrow Biopsy:  The bone marrow is hypercellular for age with increased number of atypical plasma cells representing 14%;  The background shows trilineage hematopoiesis with generally mild nonspecific changes.  Features  diagnostic of a myeloid neoplastic process are not present. Cytogenetic: FISH: Gain of 11/ q- standard risk. JAN 2025- PET scan- negative for any bone lesions.   # Proceed with Revlimid   Velcade  dexamethasone - Revlimid  at 15 mg 2 weeks on 1 week off.  Tolerating well continue current dose.  In future however if not tolerating well would cut down the dose to 10 mg. Labs-CBC/chemistries were reviewed with the patient.  # # Intermittent thrombocytopenia isolated  > 100- mild monitor for now-while on Eliquis . Stable.    #  Hx of DM- well controlled-continue Amaryl 4 mg; increase the dose od actos  to 45 mg/day- continue checking BG fasting-    # March 2022 [Dr.Sparks]-small volume PE/ RIGHT LE DVT- Positive for deep venous thrombosis in the right lower extremity [Above Knee- fem]; August 2022-Given recurrent DVT; prior history of PE--currently on indefinite anticoagulation.  No Active Bleeding.- stable  # Hx o PVD [s/p stenting]- on eliquis - [Dr.Dew]  # DVT ID prophylaxis-on Eliquis  and acyclovir  Q weekly labs- injection; MD q 3 w  # DISPOSITION: # standing order for q 4 W MM panel; K/L light chains # chemo today-  # follow up as per IS- dr.B   All questions were answered. The patient knows to call the clinic with any problems, questions or concerns.    Jonica Bickhart R Terrin Imparato, MD 05/26/2024 9:30 AM

## 2024-05-26 NOTE — Progress Notes (Signed)
 C/o food and drinks not having a taste like it use to x3-4 days. Using plastic utensils.

## 2024-05-26 NOTE — Assessment & Plan Note (Addendum)
#   MULTIPLE MYELOMA: ELEVATED M protein:MARCH 2023- IgGK- 1.6 /dl; K/L= 33 [anemia-see above; no hypercalcemia no renal insufficiency; ?  Bone lesions-no x-rays done] OCT  2024- M protein- 1.7; K/L ratio: 57. DEC 12th, Bone marrow Biopsy:  The bone marrow is hypercellular for age with increased number of atypical plasma cells representing 14%;  The background shows trilineage hematopoiesis with generally mild nonspecific changes.  Features  diagnostic of a myeloid neoplastic process are not present. Cytogenetic: FISH: Gain of 11/ q- standard risk. JAN 2025- PET scan- negative for any bone lesions.   # Proceed with Revlimid   Velcade  dexamethasone - Revlimid  at 15 mg 2 weeks on 1 week off..  However if not tolerating well would cut down the dose to 10 mg. Labs-CBC/chemistries were reviewed with the patient.  # Dysgeusia-secondary to chemotherapy monitor for now.  Consider nutrition evaluation if not improved  # # Intermittent thrombocytopenia isolated  > 100- mild monitor for now-while on Eliquis . Stable.   # Forearm-rash/squamous of carcinoma [as per dermatology]-okay to use topical 5-FU.  # Hx of DM- well controlled-continue Amaryl 4 mg; increase the dose od actos  to 45 mg/day- continue checking BG fasting-    # March 2022 [Dr.Sparks]-small volume PE/ RIGHT LE DVT- Positive for deep venous thrombosis in the right lower extremity [Above Knee- fem]; August 2022-Given recurrent DVT; prior history of PE--currently on indefinite anticoagulation.  No Active Bleeding.- stable  # Hx o PVD [s/p stenting]- on eliquis - [Dr.Dew]-  stable  # DVT ID prophylaxis-on Eliquis  and acyclovir  Q weekly labs- injection; MD q 3 w- 1 cycle at a time.   # DISPOSITION: # standing order for q 4 W MM panel; K/L light chains # chemo today-  # follow up as per IS- dr.B

## 2024-05-26 NOTE — Patient Instructions (Signed)
 [facility] - A DEPT OF [place]  Discharge Instructions: Thank you for choosing [facility] to provide your oncology and hematology care.  If you have a lab appointment with the Cancer Center, please go directly to the Cancer Center and check in at the registration area.  Wear comfortable clothing and clothing appropriate for easy access to any Portacath or PICC line.   We strive to give you quality time with your provider. You may need to reschedule your appointment if you arrive late (15 or more minutes).  Arriving late affects you and other patients whose appointments are after yours.  Also, if you miss three or more appointments without notifying the office, you may be dismissed from the clinic at the provider's discretion.      For prescription refill requests, have your pharmacy contact our office and allow 72 hours for refills to be completed.    Today you received the following chemotherapy and/or immunotherapy agents Velcade       To help prevent nausea and vomiting after your treatment, we encourage you to take your nausea medication as directed.  BELOW ARE SYMPTOMS THAT SHOULD BE REPORTED IMMEDIATELY: *FEVER GREATER THAN 100.4 F (38 C) OR HIGHER *CHILLS OR SWEATING *NAUSEA AND VOMITING THAT IS NOT CONTROLLED WITH YOUR NAUSEA MEDICATION *UNUSUAL SHORTNESS OF BREATH *UNUSUAL BRUISING OR BLEEDING *URINARY PROBLEMS (pain or burning when urinating, or frequent urination) *BOWEL PROBLEMS (unusual diarrhea, constipation, pain near the anus) TENDERNESS IN MOUTH AND THROAT WITH OR WITHOUT PRESENCE OF ULCERS (sore throat, sores in mouth, or a toothache) UNUSUAL RASH, SWELLING OR PAIN  UNUSUAL VAGINAL DISCHARGE OR ITCHING   Items with * indicate a potential emergency and should be followed up as soon as possible or go to the Emergency Department if any problems should occur.  Please show the CHEMOTHERAPY ALERT CARD or IMMUNOTHERAPY  ALERT CARD at check-in to the Emergency Department and triage nurse.  Should you have questions after your visit or need to cancel or reschedule your appointment, please contact [facility] - A DEPT OF [facility]  [phone] and follow the prompts.  Office hours are 8:00 a.m. to 4:30 p.m. Monday - Friday. Please note that voicemails left after 4:00 p.m. may not be returned until the following business day.  We are closed weekends and major holidays. You have access to a nurse at all times for urgent questions. Please call the main number to the clinic [phone] and follow the prompts.  For any non-urgent questions, you may also contact your provider using MyChart. We now offer e-Visits for anyone 63 and older to request care online for non-urgent symptoms. For details visit [email or web address].   Also download the MyChart app! Go to the app store, search MyChart, open the app, select [place], and log in with your MyChart username and password.

## 2024-06-01 ENCOUNTER — Inpatient Hospital Stay

## 2024-06-02 ENCOUNTER — Inpatient Hospital Stay

## 2024-06-08 ENCOUNTER — Inpatient Hospital Stay

## 2024-06-09 ENCOUNTER — Inpatient Hospital Stay

## 2024-06-09 ENCOUNTER — Inpatient Hospital Stay: Admitting: Internal Medicine

## 2024-06-16 ENCOUNTER — Inpatient Hospital Stay: Admitting: Internal Medicine

## 2024-06-16 ENCOUNTER — Inpatient Hospital Stay

## 2024-06-22 ENCOUNTER — Inpatient Hospital Stay

## 2024-06-29 ENCOUNTER — Inpatient Hospital Stay

## 2024-07-13 ENCOUNTER — Ambulatory Visit: Admitting: Dermatology

## 2024-12-01 ENCOUNTER — Ambulatory Visit (INDEPENDENT_AMBULATORY_CARE_PROVIDER_SITE_OTHER): Admitting: Nurse Practitioner

## 2024-12-01 ENCOUNTER — Encounter (INDEPENDENT_AMBULATORY_CARE_PROVIDER_SITE_OTHER)

## 2025-02-01 ENCOUNTER — Ambulatory Visit: Admitting: Dermatology
